# Patient Record
Sex: Female | Born: 1965 | Race: White | Hispanic: No | Smoking: Former smoker
Health system: Southern US, Community
[De-identification: ages and names within clinical notes are randomized; demographics above are authoritative.]

## PROBLEM LIST (undated history)

## (undated) DIAGNOSIS — C801 Malignant (primary) neoplasm, unspecified: Secondary | ICD-10-CM

## (undated) DIAGNOSIS — F329 Major depressive disorder, single episode, unspecified: Secondary | ICD-10-CM

## (undated) DIAGNOSIS — T7840XA Allergy, unspecified, initial encounter: Secondary | ICD-10-CM

## (undated) DIAGNOSIS — D649 Anemia, unspecified: Secondary | ICD-10-CM

## (undated) DIAGNOSIS — G43009 Migraine without aura, not intractable, without status migrainosus: Secondary | ICD-10-CM

## (undated) DIAGNOSIS — N8003 Adenomyosis of the uterus: Secondary | ICD-10-CM

## (undated) DIAGNOSIS — D219 Benign neoplasm of connective and other soft tissue, unspecified: Secondary | ICD-10-CM

## (undated) DIAGNOSIS — S42002A Fracture of unspecified part of left clavicle, initial encounter for closed fracture: Secondary | ICD-10-CM

## (undated) DIAGNOSIS — R87619 Unspecified abnormal cytological findings in specimens from cervix uteri: Secondary | ICD-10-CM

## (undated) DIAGNOSIS — Z789 Other specified health status: Secondary | ICD-10-CM

## (undated) DIAGNOSIS — K219 Gastro-esophageal reflux disease without esophagitis: Secondary | ICD-10-CM

## (undated) DIAGNOSIS — W19XXXA Unspecified fall, initial encounter: Secondary | ICD-10-CM

## (undated) DIAGNOSIS — D0372 Melanoma in situ of left lower limb, including hip: Secondary | ICD-10-CM

## (undated) DIAGNOSIS — F32A Depression, unspecified: Secondary | ICD-10-CM

## (undated) DIAGNOSIS — I10 Essential (primary) hypertension: Secondary | ICD-10-CM

## (undated) DIAGNOSIS — F419 Anxiety disorder, unspecified: Secondary | ICD-10-CM

## (undated) DIAGNOSIS — E785 Hyperlipidemia, unspecified: Secondary | ICD-10-CM

## (undated) DIAGNOSIS — Z975 Presence of (intrauterine) contraceptive device: Secondary | ICD-10-CM

## (undated) DIAGNOSIS — N946 Dysmenorrhea, unspecified: Secondary | ICD-10-CM

## (undated) HISTORY — DX: Benign neoplasm of connective and other soft tissue, unspecified: D21.9

## (undated) HISTORY — DX: Unspecified abnormal cytological findings in specimens from cervix uteri: R87.619

## (undated) HISTORY — PX: DILATION AND CURETTAGE OF UTERUS: SHX78

## (undated) HISTORY — DX: Gastro-esophageal reflux disease without esophagitis: K21.9

## (undated) HISTORY — DX: Unspecified fall, initial encounter: W19.XXXA

## (undated) HISTORY — PX: CARPAL TUNNEL RELEASE: SHX101

## (undated) HISTORY — PX: COLPOSCOPY: SHX161

## (undated) HISTORY — DX: Anemia, unspecified: D64.9

## (undated) HISTORY — DX: Malignant (primary) neoplasm, unspecified: C80.1

## (undated) HISTORY — DX: Depression, unspecified: F32.A

## (undated) HISTORY — DX: Dysmenorrhea, unspecified: N94.6

## (undated) HISTORY — PX: VEIN LIGATION AND STRIPPING: SHX2653

## (undated) HISTORY — DX: Allergy, unspecified, initial encounter: T78.40XA

## (undated) HISTORY — DX: Adenomyosis of the uterus: N80.03

## (undated) HISTORY — DX: Major depressive disorder, single episode, unspecified: F32.9

## (undated) HISTORY — DX: Hyperlipidemia, unspecified: E78.5

## (undated) HISTORY — DX: Presence of (intrauterine) contraceptive device: Z97.5

## (undated) HISTORY — DX: Melanoma in situ of left lower limb, including hip: D03.72

## (undated) HISTORY — DX: Migraine without aura, not intractable, without status migrainosus: G43.009

## (undated) HISTORY — DX: Anxiety disorder, unspecified: F41.9

## (undated) HISTORY — DX: Essential (primary) hypertension: I10

---

## 1993-07-03 HISTORY — PX: BREAST SURGERY: SHX581

## 2003-10-30 ENCOUNTER — Other Ambulatory Visit: Admission: RE | Admit: 2003-10-30 | Discharge: 2003-10-30 | Payer: Self-pay | Admitting: Obstetrics & Gynecology

## 2004-05-09 ENCOUNTER — Ambulatory Visit: Payer: Self-pay | Admitting: Family Medicine

## 2004-08-26 ENCOUNTER — Ambulatory Visit: Payer: Self-pay | Admitting: Family Medicine

## 2004-10-03 ENCOUNTER — Other Ambulatory Visit: Admission: RE | Admit: 2004-10-03 | Discharge: 2004-10-03 | Payer: Self-pay | Admitting: Obstetrics & Gynecology

## 2006-08-08 ENCOUNTER — Ambulatory Visit: Payer: Self-pay | Admitting: Family Medicine

## 2007-06-11 ENCOUNTER — Encounter: Payer: Self-pay | Admitting: Family Medicine

## 2012-06-22 ENCOUNTER — Ambulatory Visit: Payer: Self-pay | Admitting: Family Medicine

## 2012-06-24 ENCOUNTER — Encounter: Payer: Self-pay | Admitting: Family

## 2012-06-24 ENCOUNTER — Ambulatory Visit (INDEPENDENT_AMBULATORY_CARE_PROVIDER_SITE_OTHER): Payer: Self-pay | Admitting: Family

## 2012-06-24 VITALS — BP 108/76 | HR 109 | Temp 98.5°F | Ht <= 58 in | Wt 146.0 lb

## 2012-06-24 DIAGNOSIS — R5381 Other malaise: Secondary | ICD-10-CM

## 2012-06-24 DIAGNOSIS — J069 Acute upper respiratory infection, unspecified: Secondary | ICD-10-CM

## 2012-06-24 DIAGNOSIS — IMO0001 Reserved for inherently not codable concepts without codable children: Secondary | ICD-10-CM

## 2012-06-24 DIAGNOSIS — M791 Myalgia, unspecified site: Secondary | ICD-10-CM

## 2012-06-24 DIAGNOSIS — R5383 Other fatigue: Secondary | ICD-10-CM

## 2012-06-24 MED ORDER — PREDNISONE 20 MG PO TABS: ORAL_TABLET | ORAL | Status: AC

## 2012-06-24 NOTE — Patient Instructions (Signed)

## 2012-06-24 NOTE — Progress Notes (Signed)
  Subjective:    Patient ID: Kim Elliott, female    DOB: 1966/03/04, 46 y.o.   MRN: [id number]  HPI 46 year old white female, nonsmoker, is in with complaints of cough, congestion, muscle aches and pain, and fatigue x2 days. reports vomiting mucus. Also complains of chest discomfort to her right ribs. Has been taken over-the-counter cough medication with no relief.   Review of Systems  Constitutional: Positive for fatigue.  HENT: Positive for congestion, sore throat, sneezing, postnasal drip and sinus pressure.   Respiratory: Positive for cough.   Cardiovascular: Negative.   Gastrointestinal: Negative.   Musculoskeletal: Positive for myalgias. Negative for back pain and gait problem.  Skin: Negative.   Neurological: Negative.   Hematological: Negative.   Psychiatric/Behavioral: Negative.    No past medical history on file.  History   Social History  . Marital Status: Married    Spouse Name: N/A    Number of Children: N/A  . Years of Education: N/A   Occupational History  . Not on file.   Social History Main Topics  . Smoking status: Former Games developer  . Smokeless tobacco: Not on file  . Alcohol Use: Not on file  . Drug Use: Not on file  . Sexually Active: Not on file   Other Topics Concern  . Not on file   Social History Narrative  . No narrative on file    No past surgical history on file.  No family history on file.  Allergies  Allergen Reactions  . Codeine     No current outpatient prescriptions on file prior to visit.    BP 108/76  Pulse 109  Temp 98.5 F (36.9 C) (Oral)  Ht 4\' 7"  (1.397 m)  Wt 146 lb (66.225 kg)  BMI 33.93 kg/m2  SpO2 97%chart    Objective:   Physical Exam  Constitutional: She is oriented to person, place, and time. She appears well-developed and well-nourished.  HENT:  Right Ear: External ear normal.  Left Ear: External ear normal.  Nose: Nose normal.  Mouth/Throat: Oropharynx is clear and moist.  Neck: Normal range of  motion. Neck supple.  Cardiovascular: Normal rate and regular rhythm.   Pulmonary/Chest: Effort normal and breath sounds normal.  Abdominal: Soft. Bowel sounds are normal.  Neurological: She is alert and oriented to person, place, and time.  Skin: Skin is warm and dry.  Psychiatric: She has a normal mood and affect.          Assessment & Plan:   Assessment: Upper respiratory infection, cough, fatigue  Plan: Prednisone 60x3, 40x3, 20x3. Over-the-counter Mucinex DM as directed. Rest. Drink plenty of fluids. Patient call the office if symptoms worsen or persist. Recheck a schedule, and when necessary.

## 2013-05-26 ENCOUNTER — Telehealth: Payer: Self-pay | Admitting: Family Medicine

## 2013-05-26 MED ORDER — CEFUROXIME AXETIL 500 MG PO TABS: 500.0000 mg | ORAL_TABLET | Freq: Two times a day (BID) | ORAL | Status: DC

## 2013-05-26 NOTE — Telephone Encounter (Signed)
She has had one week of fever, sinus pressure, and a dry cough.

## 2014-04-25 ENCOUNTER — Encounter (HOSPITAL_COMMUNITY): Payer: Self-pay | Admitting: Emergency Medicine

## 2014-04-25 ENCOUNTER — Emergency Department (HOSPITAL_COMMUNITY)
Admission: EM | Admit: 2014-04-25 | Discharge: 2014-04-26 | Disposition: A | Discharge status: Home / Self Care | Payer: No Typology Code available for payment source | Attending: Emergency Medicine | Admitting: Emergency Medicine

## 2014-04-25 DIAGNOSIS — W19XXXA Unspecified fall, initial encounter: Secondary | ICD-10-CM

## 2014-04-25 DIAGNOSIS — Z87891 Personal history of nicotine dependence: Secondary | ICD-10-CM | POA: Diagnosis not present

## 2014-04-25 DIAGNOSIS — Y9289 Other specified places as the place of occurrence of the external cause: Secondary | ICD-10-CM | POA: Diagnosis not present

## 2014-04-25 DIAGNOSIS — S42002A Fracture of unspecified part of left clavicle, initial encounter for closed fracture: Secondary | ICD-10-CM

## 2014-04-25 DIAGNOSIS — Y9301 Activity, walking, marching and hiking: Secondary | ICD-10-CM | POA: Diagnosis not present

## 2014-04-25 DIAGNOSIS — W1830XA Fall on same level, unspecified, initial encounter: Secondary | ICD-10-CM | POA: Insufficient documentation

## 2014-04-25 DIAGNOSIS — S42022A Displaced fracture of shaft of left clavicle, initial encounter for closed fracture: Secondary | ICD-10-CM | POA: Diagnosis not present

## 2014-04-25 DIAGNOSIS — S4992XA Unspecified injury of left shoulder and upper arm, initial encounter: Secondary | ICD-10-CM | POA: Diagnosis present

## 2014-04-25 MED ORDER — LORAZEPAM 2 MG/ML IJ SOLN
0.2500 mg | Freq: Once | INTRAMUSCULAR | Status: DC
  Filled 2014-04-25: qty 1

## 2014-04-25 MED ORDER — LORAZEPAM 2 MG/ML IJ SOLN
0.5000 mg | Freq: Once | INTRAMUSCULAR | Status: AC
  Administered 2014-04-25: 0.25 mg via INTRAVENOUS

## 2014-04-25 NOTE — ED Notes (Signed)
Per ems- pt was walking dogs and was pulled down. Pt sts she felt crack to L clavicle. Some pain to back of head as well. No LOC. ems administered a total 218mcg of fentanyl last dose at 2152.

## 2014-04-25 NOTE — ED Provider Notes (Signed)
CSN: [id number]     Arrival date & time 04/25/14  2157 History   First MD Initiated Contact with Patient 04/25/14 2206     Chief Complaint  Patient presents with  . Arm Injury     (Consider location/radiation/quality/duration/timing/severity/associated sxs/prior Treatment) HPI  Patient presents the emergency department by EMS after injury from falling. She was walking her Retail banker when he pulled hard on the leash causing her to fall down with her left side towards the ground. She reports feeling a crack to her left clavicle on impact and immediatly having severe pain. He denies hitting her head or LOC. EMS gave her 250 g of fentanyl however the patient says her pain did not improve with this medication and it still hurts. She denies having pain anywhere else. The patient is tearful.  History reviewed. No pertinent past medical history. History reviewed. No pertinent past surgical history. No family history on file. History  Substance Use Topics  . Smoking status: Former Research scientist (life sciences)  . Smokeless tobacco: Not on file  . Alcohol Use: Yes   OB History   Grav Para Term Preterm Abortions TAB SAB Ect Mult Living                 Review of Systems  10 Systems reviewed and are negative for acute change except as noted in the HPI.   Allergies  Codeine  Home Medications   Prior to Admission medications   Medication Sig Start Date End Date Taking? Authorizing Provider  loratadine (CLARITIN) 10 MG tablet Take 10 mg by mouth daily as needed for allergies.   Yes Historical Provider, MD  ibuprofen (ADVIL,MOTRIN) 600 MG tablet Take 1 tablet (600 mg total) by mouth every 6 (six) hours as needed. 04/26/14   Linus Mako, PA-C  traMADol (ULTRAM) 50 MG tablet Take 1 tablet (50 mg total) by mouth every 6 (six) hours as needed. 04/26/14   Shirely Toren Marilu Favre, PA-C   BP 119/80  Pulse 89  Temp(Src) 98.2 F (36.8 C)  Resp 16  Ht 5\' 5"  (1.651 m)  Wt 120 lb (54.432 kg)  BMI 19.97 kg/m2   SpO2 97%  LMP 04/04/2014 Physical Exam  Nursing note and vitals reviewed. Constitutional: She appears well-developed and well-nourished. No distress.  HENT:  Head: Normocephalic and atraumatic. Head is without contusion and without laceration.  Eyes: Pupils are equal, round, and reactive to light.  Neck: Normal range of motion. Neck supple. No spinous process tenderness and no muscular tenderness present. Normal range of motion present.  Cardiovascular: Normal rate and regular rhythm.   Pulmonary/Chest: Effort normal.  Abdominal: Soft.  Musculoskeletal:       Left shoulder: She exhibits decreased range of motion, tenderness, bony tenderness and pain. She exhibits no swelling, no effusion, no crepitus, no deformity, no laceration, no spasm, normal pulse and normal strength.  Pt having severe tenderness to left clavicle. Decreased ROM due to pain. She has no skin tenting or obvious deformities. Grip strength is physiologic. No pain to palpation of neck, humerus, olecranon, radial/ulna, wrist or hand.  Neurological: She is alert.  Skin: Skin is warm and dry.    ED Course  Procedures (including critical care time) Labs Review Labs Reviewed - No data to display  Imaging Review Dg Clavicle Left  04/26/2014   CLINICAL DATA:  Fall, pulled down by dog. Left shoulder/clavicle pain. Limited range of motion within the left arm. Initial encounter.  EXAM: LEFT CLAVICLE - 2+ VIEWS  COMPARISON:  Left shoulder series performed today.  FINDINGS: There it is a fracture through the midshaft of the left clavicle. Mild apex superior angulation. Mildly displaced fracture fragments, best seen on the shoulder series. AC joint and glenohumeral joint are intact.  IMPRESSION: Mildly angulated and displaced mid left clavicle fracture.   Electronically Signed   By: Rolm Baptise M.D.   On: 04/26/2014 00:18   Dg Shoulder Left  04/26/2014   CLINICAL DATA:  Fall, pole down by dog. Left clavicle/shoulder pain. Initial  encounter.  EXAM: LEFT SHOULDER - 2+ VIEW  COMPARISON:  None.  FINDINGS: There is a mid left clavicle fracture with mild displacement and apex superior angulation. AC joint and glenohumeral joint are intact. No additional acute bony abnormality.  IMPRESSION: Mildly angulated and displaced mid left clavicle fracture.   Electronically Signed   By: Rolm Baptise M.D.   On: 04/26/2014 00:20     EKG Interpretation None      MDM   Final diagnoses:  Fall  Closed left clavicular fracture, initial encounter    Medications  LORazepam (ATIVAN) injection 0.5 mg (0.25 mg Intravenous Given 04/25/14 2247)  ketorolac (TORADOL) 30 MG/ML injection 30 mg (30 mg Intravenous Given 04/26/14 0040)  ondansetron (ZOFRAN) injection 4 mg (4 mg Intravenous Given 04/26/14 0038)    Pt says that she can not tolerate Vicodin or Percocet as it makes her hallucinate. She says that none of the medication we have given her in the ED have helped. She has no tenting or tenderness proximal or distal to the clavicle. Normal neurovascular exam. Dr. Alvan Dame is on call but her orthopedist is at Dr. Percell Miller and [facility] office, she prefers to be referred to this location.  Given Ice pack and shoulder immobilizer. Discussed treatment plan. She voiced her understanding.  48 y.o.Jaedah Betzer's evaluation in the Emergency Department is complete. It has been determined that no acute conditions requiring further emergency intervention are present at this time. The patient/guardian have been advised of the diagnosis and plan. We have discussed signs and symptoms that warrant return to the ED, such as changes or worsening in symptoms.  Vital signs are stable at discharge. Filed Vitals:   04/26/14 0050  BP: 119/80  Pulse: 89  Temp:   Resp: 16    Patient/guardian has voiced understanding and agreed to follow-up with the PCP or specialist.     Linus Mako, PA-C 04/26/14 0128

## 2014-04-26 ENCOUNTER — Emergency Department (HOSPITAL_COMMUNITY): Payer: No Typology Code available for payment source

## 2014-04-26 MED ORDER — IBUPROFEN 800 MG PO TABS: 800.0000 mg | ORAL_TABLET | Freq: Once | ORAL | Status: DC

## 2014-04-26 MED ORDER — IBUPROFEN 600 MG PO TABS: 600.0000 mg | ORAL_TABLET | Freq: Four times a day (QID) | ORAL | Status: DC | PRN

## 2014-04-26 MED ORDER — ONDANSETRON 4 MG PO TBDP: 4.0000 mg | ORAL_TABLET | Freq: Once | ORAL | Status: DC

## 2014-04-26 MED ORDER — ONDANSETRON HCL 4 MG/2ML IJ SOLN
4.0000 mg | Freq: Once | INTRAMUSCULAR | Status: AC
  Administered 2014-04-26: 4 mg via INTRAVENOUS
  Filled 2014-04-26: qty 2

## 2014-04-26 MED ORDER — OXYCODONE-ACETAMINOPHEN 5-325 MG PO TABS: 1.0000 | ORAL_TABLET | Freq: Once | ORAL | Status: DC

## 2014-04-26 MED ORDER — TRAMADOL HCL 50 MG PO TABS: 50.0000 mg | ORAL_TABLET | Freq: Four times a day (QID) | ORAL | Status: DC | PRN

## 2014-04-26 MED ORDER — TRAMADOL HCL 50 MG PO TABS: 50.0000 mg | ORAL_TABLET | Freq: Once | ORAL | Status: DC

## 2014-04-26 MED ORDER — KETOROLAC TROMETHAMINE 30 MG/ML IJ SOLN
30.0000 mg | Freq: Once | INTRAMUSCULAR | Status: AC
  Administered 2014-04-26: 30 mg via INTRAVENOUS
  Filled 2014-04-26: qty 1

## 2014-04-26 NOTE — ED Notes (Signed)
Ortho tech paged to place sling on pt.

## 2014-04-26 NOTE — Discharge Instructions (Signed)
Clavicle Fracture °The clavicle, also called the collarbone, is the long bone that connects your shoulder to your rib cage. You can feel your collarbone at the top of your shoulders and rib cage. A clavicle fracture is a broken clavicle. It is a common injury that can happen at any age.  °CAUSES °Common causes of a clavicle fracture include: °· A direct blow to your shoulder. °· A car accident. °· A fall, especially if you try to break your fall with an outstretched arm. °RISK FACTORS °You may be at increased risk if: °· You are younger than 25 years or older than 75 years. Most clavicle fractures happen to people who are younger than 25 years. °· You are a female. °· You play contact sports. °SIGNS AND SYMPTOMS °A fractured clavicle is painful. It also makes it hard to move your arm. Other signs and symptoms may include: °· A shoulder that drops downward and forward. °· Pain when trying to lift your shoulder. °· Bruising, swelling, and tenderness over your clavicle. °· A grinding noise when you try to move your shoulder. °· A bump over your clavicle. °DIAGNOSIS °Your health care provider can usually diagnose a clavicle fracture by asking about your injury and examining your shoulder and clavicle. He or she may take an X-ray to determine the position of your clavicle. °TREATMENT °Treatment depends on the position of your clavicle after the fracture: °· If the broken ends of the bone are not out of place, your health care provider may put your arm in a sling or wrap a support bandage around your chest (figure-of-eight wrap). °· If the broken ends of the bone are out of place, you may need surgery. Surgery may involve placing screws, pins, or plates to keep your clavicle stable while it heals. Healing may take about 3 months. °When your health care provider thinks your fracture has healed enough, you may have to do physical therapy to regain normal movement and build up your arm strength. °HOME CARE INSTRUCTIONS   °· Apply ice to the injured area: °¨ Put ice in a plastic bag. °¨ Place a towel between your skin and the bag. °¨ Leave the ice on for 20 minutes, 2-3 times a day. °· If you have a wrap or splint: °¨ Wear it all the time, and remove it only to take a bath or shower. °¨ When you bathe or shower, keep your shoulder in the same position as when the sling or wrap is on. °¨ Do not lift your arm. °· If you have a figure-of-eight wrap: °¨ Another person must tighten it every day. °¨ It should be tight enough to hold your shoulders back. °¨ Allow enough room to place your index finger between your body and the strap. °¨ Loosen the wrap immediately if you feel numbness or tingling in your hands. °· Only take medicines as directed by your health care provider. °· Avoid activities that make the injury or pain worse for 4-6 weeks after surgery. °· Keep all follow-up appointments. °SEEK MEDICAL CARE IF:  °Your medicine is not helping to relieve pain and swelling. °SEEK IMMEDIATE MEDICAL CARE IF:  °Your arm is numb, cold, or pale, even when the splint is loose. °MAKE SURE YOU:  °· Understand these instructions. °· Will watch your condition. °· Will get help right away if you are not doing well or get worse. °Document Released: 03/29/2005 Document Revised: 06/24/2013 Document Reviewed: 05/12/2013 °[employer]® Patient Information ©2015 [employer]. This information is   not intended to replace advice given to you by your health care provider. Make sure you discuss any questions you have with your health care provider.

## 2014-04-26 NOTE — ED Provider Notes (Signed)
Medical screening examination/treatment/procedure(s) were performed by non-physician practitioner and as supervising physician I was immediately available for consultation/collaboration.   EKG Interpretation None        Pamella Pert, MD 04/26/14 1106

## 2014-04-30 ENCOUNTER — Encounter (HOSPITAL_BASED_OUTPATIENT_CLINIC_OR_DEPARTMENT_OTHER): Payer: Self-pay | Admitting: *Deleted

## 2014-04-30 NOTE — Progress Notes (Signed)
No labs needed

## 2014-05-04 ENCOUNTER — Other Ambulatory Visit: Payer: Self-pay | Admitting: Orthopedic Surgery

## 2014-05-05 ENCOUNTER — Ambulatory Visit (HOSPITAL_BASED_OUTPATIENT_CLINIC_OR_DEPARTMENT_OTHER): Payer: No Typology Code available for payment source | Admitting: Anesthesiology

## 2014-05-05 ENCOUNTER — Encounter (HOSPITAL_BASED_OUTPATIENT_CLINIC_OR_DEPARTMENT_OTHER)
Admission: RE | Disposition: A | Discharge status: Home / Self Care | Payer: Self-pay | Source: Ambulatory Visit | Attending: Orthopedic Surgery

## 2014-05-05 ENCOUNTER — Ambulatory Visit (HOSPITAL_BASED_OUTPATIENT_CLINIC_OR_DEPARTMENT_OTHER)
Admission: RE | Admit: 2014-05-05 | Discharge: 2014-05-05 | Disposition: A | Discharge status: Home / Self Care | Payer: No Typology Code available for payment source | Source: Ambulatory Visit | Attending: Orthopedic Surgery | Admitting: Orthopedic Surgery

## 2014-05-05 ENCOUNTER — Encounter (HOSPITAL_BASED_OUTPATIENT_CLINIC_OR_DEPARTMENT_OTHER): Payer: Self-pay

## 2014-05-05 DIAGNOSIS — S42002A Fracture of unspecified part of left clavicle, initial encounter for closed fracture: Secondary | ICD-10-CM | POA: Insufficient documentation

## 2014-05-05 DIAGNOSIS — X58XXXA Exposure to other specified factors, initial encounter: Secondary | ICD-10-CM | POA: Diagnosis not present

## 2014-05-05 DIAGNOSIS — Z885 Allergy status to narcotic agent status: Secondary | ICD-10-CM | POA: Insufficient documentation

## 2014-05-05 DIAGNOSIS — Y929 Unspecified place or not applicable: Secondary | ICD-10-CM | POA: Diagnosis not present

## 2014-05-05 DIAGNOSIS — Z87891 Personal history of nicotine dependence: Secondary | ICD-10-CM | POA: Diagnosis not present

## 2014-05-05 HISTORY — PX: ORIF CLAVICULAR FRACTURE: SHX5055

## 2014-05-05 HISTORY — DX: Fracture of unspecified part of left clavicle, initial encounter for closed fracture: S42.002A

## 2014-05-05 HISTORY — DX: Other specified health status: Z78.9

## 2014-05-05 SURGERY — OPEN REDUCTION INTERNAL FIXATION (ORIF) CLAVICULAR FRACTURE
Anesthesia: General | Site: Shoulder | Laterality: Left

## 2014-05-05 MED ORDER — PROPOFOL 10 MG/ML IV BOLUS
INTRAVENOUS | Status: DC | PRN
  Administered 2014-05-05: 200 mg via INTRAVENOUS

## 2014-05-05 MED ORDER — ONDANSETRON HCL 4 MG/2ML IJ SOLN
INTRAMUSCULAR | Status: DC | PRN
  Administered 2014-05-05: 4 mg via INTRAVENOUS

## 2014-05-05 MED ORDER — ONDANSETRON HCL 4 MG PO TABS: 4.0000 mg | ORAL_TABLET | Freq: Three times a day (TID) | ORAL | Status: DC | PRN

## 2014-05-05 MED ORDER — DEXAMETHASONE SODIUM PHOSPHATE 4 MG/ML IJ SOLN
INTRAMUSCULAR | Status: DC | PRN
  Administered 2014-05-05: 10 mg via INTRAVENOUS

## 2014-05-05 MED ORDER — HYDROMORPHONE HCL 1 MG/ML IJ SOLN
INTRAMUSCULAR | Status: AC
  Filled 2014-05-05: qty 1

## 2014-05-05 MED ORDER — 0.9 % SODIUM CHLORIDE (POUR BTL) OPTIME
TOPICAL | Status: DC | PRN
  Administered 2014-05-05: 500 mL

## 2014-05-05 MED ORDER — MIDAZOLAM HCL 2 MG/2ML IJ SOLN
1.0000 mg | INTRAMUSCULAR | Status: DC | PRN
  Administered 2014-05-05: 2 mg via INTRAVENOUS

## 2014-05-05 MED ORDER — MIDAZOLAM HCL 2 MG/2ML IJ SOLN
INTRAMUSCULAR | Status: AC
  Filled 2014-05-05: qty 2

## 2014-05-05 MED ORDER — SUFENTANIL CITRATE 50 MCG/ML IV SOLN
INTRAVENOUS | Status: AC
  Filled 2014-05-05: qty 1

## 2014-05-05 MED ORDER — MIDAZOLAM HCL 5 MG/5ML IJ SOLN
INTRAMUSCULAR | Status: DC | PRN
  Administered 2014-05-05: 2 mg via INTRAVENOUS

## 2014-05-05 MED ORDER — PROPOFOL 10 MG/ML IV BOLUS
INTRAVENOUS | Status: AC
  Filled 2014-05-05: qty 60

## 2014-05-05 MED ORDER — BACLOFEN 10 MG PO TABS: 10.0000 mg | ORAL_TABLET | Freq: Three times a day (TID) | ORAL | Status: DC

## 2014-05-05 MED ORDER — LIDOCAINE HCL (CARDIAC) 20 MG/ML IV SOLN
INTRAVENOUS | Status: DC | PRN
  Administered 2014-05-05: 50 mg via INTRAVENOUS

## 2014-05-05 MED ORDER — SUCCINYLCHOLINE CHLORIDE 20 MG/ML IJ SOLN
INTRAMUSCULAR | Status: DC | PRN
  Administered 2014-05-05: 100 mg via INTRAVENOUS

## 2014-05-05 MED ORDER — EPHEDRINE SULFATE 50 MG/ML IJ SOLN
INTRAMUSCULAR | Status: DC | PRN
  Administered 2014-05-05: 15 mg via INTRAVENOUS

## 2014-05-05 MED ORDER — CEFAZOLIN SODIUM 1-5 GM-% IV SOLN
INTRAVENOUS | Status: AC
  Filled 2014-05-05: qty 100

## 2014-05-05 MED ORDER — SUCCINYLCHOLINE CHLORIDE 20 MG/ML IJ SOLN
INTRAMUSCULAR | Status: AC
  Filled 2014-05-05: qty 1

## 2014-05-05 MED ORDER — ONDANSETRON HCL 4 MG/2ML IJ SOLN: 4.0000 mg | Freq: Once | INTRAMUSCULAR | Status: DC | PRN

## 2014-05-05 MED ORDER — SENNA-DOCUSATE SODIUM 8.6-50 MG PO TABS: 2.0000 | ORAL_TABLET | Freq: Every day | ORAL | Status: DC

## 2014-05-05 MED ORDER — OXYCODONE-ACETAMINOPHEN 5-325 MG PO TABS: 1.0000 | ORAL_TABLET | Freq: Four times a day (QID) | ORAL | Status: DC | PRN

## 2014-05-05 MED ORDER — FENTANYL CITRATE 0.05 MG/ML IJ SOLN
INTRAMUSCULAR | Status: AC
  Filled 2014-05-05: qty 2

## 2014-05-05 MED ORDER — HYDROMORPHONE HCL 1 MG/ML IJ SOLN
0.5000 mg | INTRAMUSCULAR | Status: DC | PRN
  Administered 2014-05-05 (×3): 0.5 mg via INTRAVENOUS

## 2014-05-05 MED ORDER — FENTANYL CITRATE 0.05 MG/ML IJ SOLN
50.0000 ug | INTRAMUSCULAR | Status: DC | PRN
  Administered 2014-05-05: 100 ug via INTRAVENOUS

## 2014-05-05 MED ORDER — LACTATED RINGERS IV SOLN
INTRAVENOUS | Status: DC
  Administered 2014-05-05 (×2): via INTRAVENOUS
  Administered 2014-05-05: 10 mL/h via INTRAVENOUS

## 2014-05-05 MED ORDER — BUPIVACAINE-EPINEPHRINE (PF) 0.5% -1:200000 IJ SOLN
INTRAMUSCULAR | Status: DC | PRN
  Administered 2014-05-05: 25 mL via PERINEURAL

## 2014-05-05 MED ORDER — FENTANYL CITRATE 0.05 MG/ML IJ SOLN: 25.0000 ug | INTRAMUSCULAR | Status: DC | PRN

## 2014-05-05 MED ORDER — SUFENTANIL CITRATE 50 MCG/ML IV SOLN
INTRAVENOUS | Status: DC | PRN
  Administered 2014-05-05: 10 ug via INTRAVENOUS

## 2014-05-05 MED ORDER — CEFAZOLIN SODIUM-DEXTROSE 2-3 GM-% IV SOLR
2.0000 g | INTRAVENOUS | Status: AC
  Administered 2014-05-05: 2 g via INTRAVENOUS

## 2014-05-05 SURGICAL SUPPLY — 65 items
BENZOIN TINCTURE PRP APPL 2/3 (GAUZE/BANDAGES/DRESSINGS) IMPLANT
BIT DRILL 2.8X5 QR DISP (BIT) ×2 IMPLANT
BLADE 11 SAFETY STRL DISP (BLADE) IMPLANT
BLADE SURG 15 STRL LF DISP TIS (BLADE) ×2 IMPLANT
BLADE SURG 15 STRL SS (BLADE) ×4
BLADE VORTEX 6.0 (BLADE) IMPLANT
CANISTER SUCT 3000ML (MISCELLANEOUS) IMPLANT
CLEANER CAUTERY TIP 5X5 PAD (MISCELLANEOUS) IMPLANT
CLSR STERI-STRIP ANTIMIC 1/2X4 (GAUZE/BANDAGES/DRESSINGS) IMPLANT
DECANTER SPIKE VIAL GLASS SM (MISCELLANEOUS) IMPLANT
DRAPE C-ARM 42X72 X-RAY (DRAPES) IMPLANT
DRAPE INCISE IOBAN 66X45 STRL (DRAPES) ×2 IMPLANT
DRAPE OEC MINIVIEW 54X84 (DRAPES) ×2 IMPLANT
DRAPE SURG 17X23 STRL (DRAPES) ×2 IMPLANT
DRAPE U 20/CS (DRAPES) ×2 IMPLANT
DRAPE U-SHAPE 47X51 STRL (DRAPES) ×2 IMPLANT
DRAPE U-SHAPE 76X120 STRL (DRAPES) ×4 IMPLANT
DURAPREP 26ML APPLICATOR (WOUND CARE) ×2 IMPLANT
ELECT REM PT RETURN 9FT ADLT (ELECTROSURGICAL) ×2
ELECTRODE REM PT RTRN 9FT ADLT (ELECTROSURGICAL) ×1 IMPLANT
GAUZE SPONGE 4X4 12PLY STRL (GAUZE/BANDAGES/DRESSINGS) ×2 IMPLANT
GAUZE SPONGE 4X4 16PLY XRAY LF (GAUZE/BANDAGES/DRESSINGS) IMPLANT
GAUZE XEROFORM 1X8 LF (GAUZE/BANDAGES/DRESSINGS) IMPLANT
GLOVE BIO SURGEON STRL SZ 6.5 (GLOVE) ×2 IMPLANT
GLOVE BIO SURGEON STRL SZ8 (GLOVE) ×2 IMPLANT
GLOVE BIOGEL PI IND STRL 7.0 (GLOVE) ×1 IMPLANT
GLOVE BIOGEL PI IND STRL 8 (GLOVE) ×2 IMPLANT
GLOVE BIOGEL PI INDICATOR 7.0 (GLOVE) ×1
GLOVE BIOGEL PI INDICATOR 8 (GLOVE) ×2
GLOVE EXAM NITRILE MD LF STRL (GLOVE) ×2 IMPLANT
GLOVE ORTHO TXT STRL SZ7.5 (GLOVE) ×4 IMPLANT
GOWN STRL REUS W/ TWL LRG LVL3 (GOWN DISPOSABLE) ×1 IMPLANT
GOWN STRL REUS W/ TWL XL LVL3 (GOWN DISPOSABLE) ×2 IMPLANT
GOWN STRL REUS W/TWL LRG LVL3 (GOWN DISPOSABLE) ×1
GOWN STRL REUS W/TWL XL LVL3 (GOWN DISPOSABLE) ×2
NS IRRIG 1000ML POUR BTL (IV SOLUTION) ×2 IMPLANT
PACK ARTHROSCOPY DSU (CUSTOM PROCEDURE TRAY) ×2 IMPLANT
PACK BASIN DAY SURGERY FS (CUSTOM PROCEDURE TRAY) ×2 IMPLANT
PAD CLEANER CAUTERY TIP 5X5 (MISCELLANEOUS)
PENCIL BUTTON HOLSTER BLD 10FT (ELECTRODE) ×2 IMPLANT
PLATE CLAV LOCK 6H SML (Plate) ×2 IMPLANT
SCREW NON LOCK 3.5X10MM (Screw) ×4 IMPLANT
SCREW NON LOCK 3.5X8MM (Screw) ×10 IMPLANT
SLEEVE SCD COMPRESS KNEE MED (MISCELLANEOUS) ×2 IMPLANT
SLING ARM IMMOBILIZER LRG (SOFTGOODS) IMPLANT
SLING ARM IMMOBILIZER MED (SOFTGOODS) IMPLANT
SLING ARM LRG ADULT FOAM STRAP (SOFTGOODS) IMPLANT
SLING ARM MED ADULT FOAM STRAP (SOFTGOODS) IMPLANT
SLING ARM XL FOAM STRAP (SOFTGOODS) IMPLANT
SPONGE LAP 4X18 X RAY DECT (DISPOSABLE) ×2 IMPLANT
SUCTION FRAZIER TIP 10 FR DISP (SUCTIONS) ×2 IMPLANT
SUT FIBERWIRE #2 38 T-5 BLUE (SUTURE)
SUT MNCRL AB 4-0 PS2 18 (SUTURE) IMPLANT
SUT VIC AB 0 CT1 18XCR BRD 8 (SUTURE) ×1 IMPLANT
SUT VIC AB 0 CT1 27 (SUTURE)
SUT VIC AB 0 CT1 27XBRD ANBCTR (SUTURE) IMPLANT
SUT VIC AB 0 CT1 8-18 (SUTURE) ×1
SUT VIC AB 2-0 SH 27 (SUTURE)
SUT VIC AB 2-0 SH 27XBRD (SUTURE) IMPLANT
SUT VICRYL 3-0 CR8 SH (SUTURE) ×2 IMPLANT
SUTURE FIBERWR #2 38 T-5 BLUE (SUTURE) IMPLANT
SYR BULB 3OZ (MISCELLANEOUS) ×2 IMPLANT
TAPE STRIPS DRAPE STRL (GAUZE/BANDAGES/DRESSINGS) IMPLANT
TOWEL OR 17X24 6PK STRL BLUE (TOWEL DISPOSABLE) ×2 IMPLANT
YANKAUER SUCT BULB TIP NO VENT (SUCTIONS) IMPLANT

## 2014-05-05 NOTE — Transfer of Care (Signed)
Immediate Anesthesia Transfer of Care Note  Patient: Kim Elliott  Procedure(s) Performed: Procedure(s): OPEN REDUCTION INTERNAL FIXATION (ORIF) LEFT  CLAVICULAR FRACTURE (Left)  Patient Location: PACU  Anesthesia Type:General and Regional  Level of Consciousness: awake, alert  and oriented  Airway & Oxygen Therapy: Patient Spontanous Breathing and Patient connected to face mask oxygen  Post-op Assessment: Report given to PACU RN and Post -op Vital signs reviewed and stable  Post vital signs: Reviewed and stable  Complications: No apparent anesthesia complications

## 2014-05-05 NOTE — Anesthesia Procedure Notes (Addendum)
Anesthesia Regional Block:  Interscalene brachial plexus block  Pre-Anesthetic Checklist: ,, timeout performed, Correct Patient, Correct Site, Correct Laterality, Correct Procedure, Correct Position, site marked, Risks and benefits discussed,  Surgical consent,  Pre-op evaluation,  At surgeon's request and post-op pain management  Laterality: Left and Upper  Prep: chloraprep       Needles:  Injection technique: Single-shot  Needle Type: Echogenic Needle     Needle Length: 5cm 5 cm Needle Gauge: 21 and 21 G    Additional Needles:  Procedures: ultrasound guided (picture in chart) Interscalene brachial plexus block Narrative:  Start time: 05/05/2014 12:12 PM End time: 05/05/2014 12:17 PM Injection made incrementally with aspirations every 5 mL.  Performed by: Personally    Procedure Name: Intubation Date/Time: 05/05/2014 12:54 PM Performed by: Melynda Ripple D Pre-anesthesia Checklist: Patient identified, Emergency Drugs available, Suction available and Patient being monitored Patient Re-evaluated:Patient Re-evaluated prior to inductionOxygen Delivery Method: Circle System Utilized Preoxygenation: Pre-oxygenation with 100% oxygen Intubation Type: IV induction Ventilation: Mask ventilation without difficulty Laryngoscope Size: Mac and 3 Grade View: Grade I Tube type: Oral Tube size: 7.0 mm Number of attempts: 1 Airway Equipment and Method: stylet and oral airway Placement Confirmation: ETT inserted through vocal cords under direct vision,  positive ETCO2 and breath sounds checked- equal and bilateral Secured at: 23 cm Tube secured with: Tape Dental Injury: Teeth and Oropharynx as per pre-operative assessment

## 2014-05-05 NOTE — Anesthesia Postprocedure Evaluation (Signed)
  Anesthesia Post-op Note  Patient: Kim Elliott  Procedure(s) Performed: Procedure(s): OPEN REDUCTION INTERNAL FIXATION (ORIF) LEFT  CLAVICULAR FRACTURE (Left)  Patient Location: PACU  Anesthesia Type:GA combined with regional for post-op pain  Level of Consciousness: awake, alert , oriented and patient cooperative  Airway and Oxygen Therapy: Patient Spontanous Breathing  Post-op Pain: none  Post-op Assessment: Post-op Vital signs reviewed, Patient's Cardiovascular Status Stable, Respiratory Function Stable, Patent Airway, No signs of Nausea or vomiting, Adequate PO intake and Pain level controlled  Post-op Vital Signs: Reviewed and stable  Last Vitals:  Filed Vitals:   05/05/14 1600  BP: 141/99  Pulse: 98  Temp:   Resp: 17    Complications: No apparent anesthesia complications

## 2014-05-05 NOTE — Anesthesia Preprocedure Evaluation (Signed)
Anesthesia Evaluation  Patient identified by MRN, date of birth, ID band Patient awake    Reviewed: Allergy & Precautions, H&P , NPO status , Patient's Chart, lab work & pertinent test results  Airway Mallampati: I  TM Distance: >3 FB Neck ROM: Full    Dental  (+) Teeth Intact, Dental Advisory Given   Pulmonary former smoker,  breath sounds clear to auscultation        Cardiovascular Rhythm:Regular Rate:Normal     Neuro/Psych    GI/Hepatic   Endo/Other    Renal/GU      Musculoskeletal   Abdominal   Peds  Hematology   Anesthesia Other Findings   Reproductive/Obstetrics                             Anesthesia Physical Anesthesia Plan  ASA: I  Anesthesia Plan: General   Post-op Pain Management:    Induction: Intravenous  Airway Management Planned: Oral ETT  Additional Equipment:   Intra-op Plan:   Post-operative Plan: Extubation in OR  Informed Consent: I have reviewed the patients History and Physical, chart, labs and discussed the procedure including the risks, benefits and alternatives for the proposed anesthesia with the patient or authorized representative who has indicated his/her understanding and acceptance.   Dental advisory given  Plan Discussed with: CRNA, Anesthesiologist and Surgeon  Anesthesia Plan Comments:         Anesthesia Quick Evaluation

## 2014-05-05 NOTE — Op Note (Signed)
05/05/2014  2:13 PM  PATIENT:  Kim Elliott    PRE-OPERATIVE DIAGNOSIS:  LEFT CLAVICLE  FRACTURE  POST-OPERATIVE DIAGNOSIS:  Same  PROCEDURE:  OPEN REDUCTION INTERNAL FIXATION (ORIF) LEFT  CLAVICULAR FRACTURE  SURGEON:  Johnny Bridge, MD  PHYSICIAN ASSISTANT: Joya Gaskins, OPA-C, present and scrubbed throughout the case, critical for completion in a timely fashion, and for retraction, instrumentation, and closure.  ANESTHESIA:   General  PREOPERATIVE INDICATIONS:  Kim Elliott is a  48 y.o. female with a diagnosis of LEFT CLAVICLE  FRACTURE who elected for surgical management based on preoperative shortening and angulation and displacement of the fracture.    The risks benefits and alternatives were discussed with the patient preoperatively including but not limited to the risks of infection, bleeding, nerve injury, malunion, nonunion, hardware failure, the need for hardware removal, recurrent fracture, cardiopulmonary complications, the need for revision surgery, among others, and the patient was willing to proceed.    OPERATIVE IMPLANTS: Acumed 6 hole clavicle plate, with 8 and 10 mm screws  OPERATIVE FINDINGS: Shortened, displaced clavicle fracture, very small clavicle  OPERATIVE PROCEDURE: The patient was brought to the operating room and placed in the supine position. General anesthesia was administered. IV antibiotics were given. She was placed in the beach chair position. The upper extremity was prepped and draped in the usual sterile fashion. Time out was performed. Incision was made over the clavicle fracture. Dissection was carried down through the platysma, and the fracture site exposed. The fracture was extremely short.  I ultimately did however achieve satisfactory mobilization, and was able to reduce the fracture anatomically.   The fracture was essentially transverse and not amenable to a lag screw. I secured the plate medially first, and then laterally, achieving near  anatomic fixation. The remaining screws were placed both medially and laterally for a total of 6 cortices on either side. Screws appeared radiographically small, however my measurements were size 8 for all of the screws, and they had excellent purchase, and so I suspect that they may have simply been exiting slightly posteriorly, as the plate was sought slightly anterosuperior.  I did not make the screws longer in order to avoid prominence, and because at artery had excellent purchase.  I had excellent bony apposition and restoration of anatomic alignment of the clavicle. Used C-arm to confirm appropriate alignment, reduction of the fracture, and positioning of the plate and length of the screws.  I then took final C-arm pictures, irrigated the wounds copiously, and repaired the fascia with inverted figure-of-eight Vicryl suture. The subcutaneous tissue was closed with Vicryl as well, and the skin closed with steri-strips, and the patient was awakened and returned to the PACU in stable and satisfactory condition. There were no complications.

## 2014-05-05 NOTE — H&P (Signed)
PREOPERATIVE H&P  Chief Complaint: LEFT CLAVICLE  FRACTURE  HPI: Kim Elliott is a 48 y.o. female who presents for preoperative history and physical with a diagnosis of LEFT CLAVICLE  FRACTURE. Symptoms are rated as moderate to severe, and have been worsening.  This is significantly impairing activities of daily living.  She has elected for surgical management. Pain is rated as moderate to severe with movement, better with rest and pain medications.  Past Medical History  Diagnosis Date  . Medical history non-contributory    Past Surgical History  Procedure Laterality Date  . Dilation and curettage of uterus      x2post misscarage  . Breast surgery  1995    lt br bx-neg  . Vein ligation and stripping      legs   History   Social History  . Marital Status: Divorced    Spouse Name: N/A    Number of Children: N/A  . Years of Education: N/A   Social History Main Topics  . Smoking status: Former Smoker    Quit date: 04/30/1984  . Smokeless tobacco: None  . Alcohol Use: Yes     Comment: most days-wine  . Drug Use: No  . Sexual Activity: None   Other Topics Concern  . None   Social History Narrative   History reviewed. No pertinent family history. Allergies  Allergen Reactions  . Codeine Nausea And Vomiting  . Oxycodone     hallucinations  . Vicodin [Hydrocodone-Acetaminophen]     hallucinations   Prior to Admission medications   Medication Sig Start Date End Date Taking? Authorizing Provider  ibuprofen (ADVIL,MOTRIN) 600 MG tablet Take 1 tablet (600 mg total) by mouth every 6 (six) hours as needed. 04/26/14  Yes Tiffany Marilu Favre, PA-C  loratadine (CLARITIN) 10 MG tablet Take 10 mg by mouth daily as needed for allergies.   Yes Historical Provider, MD  traMADol (ULTRAM) 50 MG tablet Take 1 tablet (50 mg total) by mouth every 6 (six) hours as needed. 04/26/14  Yes Tiffany Marilu Favre, PA-C     Positive ROS: All other systems have been reviewed and were otherwise  negative with the exception of those mentioned in the HPI and as above.  Physical Exam: General: Alert, no acute distress Cardiovascular: No pedal edema Respiratory: No cyanosis, no use of accessory musculature GI: No organomegaly, abdomen is soft and non-tender Skin: No lesions in the area of chief complaint Neurologic: Sensation intact distally Psychiatric: Patient is competent for consent with normal mood and affect Lymphatic: No axillary or cervical lymphadenopathy  MUSCULOSKELETAL: left clavicle is shortened, positive pain to palpation over the mid shaft, no skin breaks. Sensation and motor is intact distally in the hand.  Assessment: LEFT CLAVICLE  FRACTURE  Plan: Plan for Procedure(s): OPEN REDUCTION INTERNAL FIXATION (ORIF) LEFT  CLAVICULAR FRACTURE  The risks benefits and alternatives were discussed with the patient including but not limited to the risks of nonoperative treatment, versus surgical intervention including infection, bleeding, nerve injury, malunion, nonunion, the need for revision surgery, hardware prominence, hardware failure, the need for hardware removal, blood clots, cardiopulmonary complications, morbidity, mortality, among others, and they were willing to proceed.     Johnny Bridge, MD Cell [phone]   05/05/2014 12:19 PM

## 2014-05-05 NOTE — Discharge Instructions (Signed)
Diet: As you were doing prior to hospitalization   Shower:  May shower but keep the wounds dry, use an occlusive plastic wrap, NO SOAKING IN TUB.  If the bandage gets wet, change with a clean dry gauze.  Dressing:  You may change your dressing 3-5 days after surgery.  Then change the dressing daily with sterile gauze dressing.    There are sticky tapes (steri-strips) on your wounds and all the stitches are absorbable.  Leave the steri-strips in place when changing your dressings, they will peel off with time, usually 2-3 weeks.  Activity:  Increase activity slowly as tolerated, but follow the weight bearing instructions below.  No lifting or driving for 6 weeks.  Weight Bearing:   No lifting with left arm.    To prevent constipation: you may use a stool softener such as -  Colace (over the counter) 100 mg by mouth twice a day  Drink plenty of fluids (prune juice may be helpful) and high fiber foods Miralax (over the counter) for constipation as needed.    Itching:  If you experience itching with your medications, try taking only a single pain pill, or even half a pain pill at a time.  You may take up to 10 pain pills per day, and you can also use benadryl over the counter for itching or also to help with sleep.   Precautions:  If you experience chest pain or shortness of breath - call 911 immediately for transfer to the hospital emergency department!!  If you develop a fever greater that 101 F, purulent drainage from wound, increased redness or drainage from wound, or calf pain -- Call the office at [phone]                                                Follow- Up Appointment:  Please call for an appointment to be seen in 2 weeks [place] - [phone]   Regional Anesthesia Blocks  1. Numbness or the inability to move the "blocked" extremity may last from 3-48 hours after placement. The length of time depends on the medication injected and your individual response to the  medication. If the numbness is not going away after 48 hours, call your surgeon.  2. The extremity that is blocked will need to be protected until the numbness is gone and the  Strength has returned. Because you cannot feel it, you will need to take extra care to avoid injury. Because it may be weak, you may have difficulty moving it or using it. You may not know what position it is in without looking at it while the block is in effect.  3. For blocks in the legs and feet, returning to weight bearing and walking needs to be done carefully. You will need to wait until the numbness is entirely gone and the strength has returned. You should be able to move your leg and foot normally before you try and bear weight or walk. You will need someone to be with you when you first try to ensure you do not fall and possibly risk injury.  4. Bruising and tenderness at the needle site are common side effects and will resolve in a few days.  5. Persistent numbness or new problems with movement should be communicated to the surgeon or the [place] [phone] [facility] [phone]).    Post Anesthesia Home Care Instructions  Activity: Get plenty of rest for the remainder of the day. A responsible adult should stay with you for 24 hours following the procedure.  For the next 24 hours, DO NOT: -Drive a car -Paediatric nurse -Drink alcoholic beverages -Take any medication unless instructed by your physician -Make any legal decisions or sign important papers.  Meals: Start with liquid foods such as gelatin or soup. Progress to regular foods as tolerated. Avoid greasy, spicy, heavy foods. If nausea and/or vomiting occur, drink only clear liquids until the nausea and/or vomiting subsides. Call your physician if vomiting continues.  Special Instructions/Symptoms: Your throat may feel dry or sore from the anesthesia or the breathing tube placed in your throat during surgery.  If this causes discomfort, gargle with warm salt water. The discomfort should disappear within 24 hours.

## 2014-05-05 NOTE — Progress Notes (Signed)
Assisted Dr. Crews with left, ultrasound guided, interscalene  block. Side rails up, monitors on throughout procedure. See vital signs in flow sheet. Tolerated Procedure well. 

## 2014-05-06 LAB — POCT HEMOGLOBIN-HEMACUE: HEMOGLOBIN: 13.4 g/dL (ref 12.0–15.0)

## 2014-05-07 ENCOUNTER — Encounter (HOSPITAL_BASED_OUTPATIENT_CLINIC_OR_DEPARTMENT_OTHER): Payer: Self-pay | Admitting: Orthopedic Surgery

## 2014-07-03 HISTORY — PX: MELANOMA EXCISION: SHX5266

## 2014-12-11 ENCOUNTER — Telehealth: Payer: Self-pay | Admitting: Family Medicine

## 2014-12-11 DIAGNOSIS — L989 Disorder of the skin and subcutaneous tissue, unspecified: Secondary | ICD-10-CM

## 2014-12-11 NOTE — Telephone Encounter (Signed)
She has a spot on the left lower leg that has been present for years but lately it is getting darker. On exam she has a macular lesion which is dark brown but has flecks of black within it. 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

## 2015-01-08 ENCOUNTER — Telehealth: Payer: Self-pay | Admitting: *Deleted

## 2015-01-08 NOTE — Telephone Encounter (Signed)
Received skin biopsy report from [facility] stating left shin showed Melanoma in Situ, lentigo maligna. Dutch Quint aware. Document will be be scanned to the chart.

## 2015-08-02 ENCOUNTER — Telehealth: Payer: Self-pay | Admitting: Family Medicine

## 2015-08-02 MED ORDER — AZITHROMYCIN 250 MG PO TABS: ORAL_TABLET | ORAL | Status: DC

## 2015-08-02 NOTE — Telephone Encounter (Signed)
She has sinus congestion and PND and fever, consistent wit a sinus infection

## 2015-11-30 ENCOUNTER — Encounter: Payer: Self-pay | Admitting: Family Medicine

## 2015-11-30 ENCOUNTER — Ambulatory Visit (INDEPENDENT_AMBULATORY_CARE_PROVIDER_SITE_OTHER): Payer: Self-pay | Admitting: Family Medicine

## 2015-11-30 ENCOUNTER — Telehealth: Payer: Self-pay | Admitting: Family

## 2015-11-30 VITALS — BP 138/96 | HR 68 | Temp 98.6°F | Ht 65.0 in | Wt 133.0 lb

## 2015-11-30 DIAGNOSIS — F4321 Adjustment disorder with depressed mood: Secondary | ICD-10-CM

## 2015-11-30 MED ORDER — FLUOXETINE HCL 20 MG PO TABS: 20.0000 mg | ORAL_TABLET | Freq: Every day | ORAL | Status: DC

## 2015-11-30 MED ORDER — LORAZEPAM 0.5 MG PO TABS: 0.5000 mg | ORAL_TABLET | Freq: Three times a day (TID) | ORAL | Status: DC | PRN

## 2015-11-30 NOTE — Progress Notes (Signed)
Pre visit review using our clinic review tool, if applicable. No additional management support is needed unless otherwise documented below in the visit note. 

## 2015-11-30 NOTE — Telephone Encounter (Signed)
Pt has been scheduled.  °

## 2015-11-30 NOTE — Telephone Encounter (Signed)
Pt states she lost her mom last night and needs to see dr fry asap. Today. Pt used to see padonda. Please advise

## 2015-11-30 NOTE — Progress Notes (Signed)
   Subjective:    Patient ID: Kim Elliott, female    DOB: 1966/03/23, 50 y.o.   MRN: [id number]  HPI Here asking for help with depression and anxiety related to the recent death of her mother. She had lived in [place] and she had been ill for about a year. She passed away over the weekend and her funeral will be later this week. She has been very upset with crying and shaking, her appetite is decreased and she has trouble sleeping. She had used Prozac successfully for PMS symptoms some years ago.    Review of Systems  Constitutional: Negative.   Respiratory: Negative.   Cardiovascular: Negative.   Neurological: Negative.   Psychiatric/Behavioral: Positive for sleep disturbance and dysphoric mood. Negative for suicidal ideas, hallucinations, confusion, self-injury, decreased concentration and agitation. The patient is nervous/anxious.        Objective:   Physical Exam  Constitutional: She is oriented to person, place, and time. She appears well-developed and well-nourished.  Neurological: She is alert and oriented to person, place, and time.  Psychiatric: Her behavior is normal. Judgment and thought content normal.  Mildly anxious           Assessment & Plan:  Grief reaction. Start on Prozac 20 mg daily and add Lorazepam 0.5 mg as needed. I also suggested she speak with a therapist when she gets back to town.  Kim Morale, MD

## 2015-11-30 NOTE — Telephone Encounter (Signed)
Per Dr. Sarajane Jews okay to schedule, need a 30 minute.

## 2016-03-15 ENCOUNTER — Encounter: Payer: Self-pay | Admitting: Family Medicine

## 2016-03-15 ENCOUNTER — Ambulatory Visit (INDEPENDENT_AMBULATORY_CARE_PROVIDER_SITE_OTHER): Payer: Self-pay | Admitting: Family Medicine

## 2016-03-15 VITALS — BP 142/94 | HR 80 | Temp 98.2°F | Ht 65.0 in | Wt 141.0 lb

## 2016-03-15 DIAGNOSIS — Z23 Encounter for immunization: Secondary | ICD-10-CM

## 2016-03-15 DIAGNOSIS — W540XXA Bitten by dog, initial encounter: Principal | ICD-10-CM

## 2016-03-15 DIAGNOSIS — S61452A Open bite of left hand, initial encounter: Secondary | ICD-10-CM

## 2016-03-15 MED ORDER — FLUOXETINE HCL 20 MG PO TABS: 20.0000 mg | ORAL_TABLET | Freq: Every day | ORAL | 3 refills | Status: DC

## 2016-03-15 MED ORDER — AMOXICILLIN-POT CLAVULANATE 875-125 MG PO TABS: 1.0000 | ORAL_TABLET | Freq: Two times a day (BID) | ORAL | 0 refills | Status: DC

## 2016-03-15 MED ORDER — CEFTRIAXONE SODIUM 1 G IJ SOLR
1.0000 g | Freq: Once | INTRAMUSCULAR | Status: AC
  Administered 2016-03-15: 1 g via INTRAMUSCULAR

## 2016-03-15 MED ORDER — LORAZEPAM 0.5 MG PO TABS: 0.5000 mg | ORAL_TABLET | Freq: Three times a day (TID) | ORAL | 5 refills | Status: DC | PRN

## 2016-03-15 NOTE — Progress Notes (Signed)
Pre visit review using our clinic review tool, if applicable. No additional management support is needed unless otherwise documented below in the visit note. 

## 2016-03-15 NOTE — Progress Notes (Signed)
   Subjective:    Patient ID: Kim Elliott, female    DOB: June 05, 1966, 50 y.o.   MRN: [id number]  HPI Here for a dog bite to the left hand which occurred at home last night. She was separating 2 dogs and one of them bit her. Otherwise the dog's behavior has been normal and his shots are up to date. She washed it last night with soap and water and applied ice. Today the hand is swollen and painful. No fever.    Review of Systems  Constitutional: Negative.   Skin: Positive for wound.       Objective:   Physical Exam  Constitutional: She appears well-developed and well-nourished.  Musculoskeletal:  The left hand has a puncture wound in the space between the first and second metacarpals. This area is swollen and quite tender. No erythema or warmth. No streaking up the arm. ROM of the fingers is full           Assessment & Plan:  Dog bite to the hand with early cellulitis. Given a shot of Rocephin and she will follow this with 10 days of Augmentin. Given a TDaP. She can use ice and Ibuprofen prn. Recheck prn.  Kim Morale, MD

## 2016-04-02 ENCOUNTER — Other Ambulatory Visit: Payer: Self-pay | Admitting: Family Medicine

## 2016-06-27 ENCOUNTER — Ambulatory Visit (INDEPENDENT_AMBULATORY_CARE_PROVIDER_SITE_OTHER): Payer: Self-pay | Admitting: Family Medicine

## 2016-06-27 VITALS — BP 130/80 | HR 104 | Temp 98.1°F | Ht 65.0 in | Wt 146.0 lb

## 2016-06-27 DIAGNOSIS — R059 Cough, unspecified: Secondary | ICD-10-CM

## 2016-06-27 DIAGNOSIS — R05 Cough: Secondary | ICD-10-CM

## 2016-06-27 MED ORDER — DOXYCYCLINE HYCLATE 100 MG PO CAPS: 100.0000 mg | ORAL_CAPSULE | Freq: Two times a day (BID) | ORAL | 0 refills | Status: DC

## 2016-06-27 NOTE — Progress Notes (Signed)
Pre visit review using our clinic review tool, if applicable. No additional management support is needed unless otherwise documented below in the visit note. 

## 2016-06-27 NOTE — Progress Notes (Signed)
Subjective:     Patient ID: Kim Elliott, female   DOB: 1965-11-24, 50 y.o.   MRN: [id number]  HPI Patient nonsmoker seen with onset last Friday of chills, fatigue, night sweats, cough. She has had some nasal drainage. She's had one episode of vomiting and diarrhea last weekend but none since then. Her cough has been fairly severe at times. She had subjective fever. She does not have a thermometer and has not taken her temperature. She's had night sweats for each of the last 4 nights  Past Medical History:  Diagnosis Date  . Closed fracture of left clavicle 05/05/2014  . Medical history non-contributory    Past Surgical History:  Procedure Laterality Date  . BREAST SURGERY  1995   lt br bx-neg  . DILATION AND CURETTAGE OF UTERUS     x2post misscarage  . ORIF CLAVICULAR FRACTURE Left 05/05/2014   Procedure: OPEN REDUCTION INTERNAL FIXATION (ORIF) LEFT  CLAVICULAR FRACTURE;  Surgeon: Johnny Bridge, MD;  Location: [facility];  Service: Orthopedics;  Laterality: Left;  Marland Kitchen VEIN LIGATION AND STRIPPING     legs    reports that she quit smoking about 32 years ago. She has never used smokeless tobacco. She reports that she drinks alcohol. She reports that she does not use drugs. family history is not on file. Allergies  Allergen Reactions  . Codeine Nausea And Vomiting  . Oxycodone     hallucinations  . Vicodin [Hydrocodone-Acetaminophen]     hallucinations     Review of Systems  Constitutional: Positive for fatigue.  HENT: Positive for congestion.   Respiratory: Positive for cough. Negative for shortness of breath and wheezing.        Objective:   Physical Exam  Constitutional: She appears well-developed and well-nourished.  HENT:  Right Ear: External ear normal.  Left Ear: External ear normal.  Mouth/Throat: Oropharynx is clear and moist.  Neck: Neck supple.  Cardiovascular: Normal rate and regular rhythm.   Pulmonary/Chest: Effort normal and breath sounds  normal. No respiratory distress. She has no wheezes. She has no rales.       Assessment:     Cough. Differential is viral process versus bacterial    Plan:     -We discussed pros and cons of chest x-ray but she has nonfocal exam this time and nontoxic in appearance -We elected to go and cover with doxycycline given duration of subjective fever. If not promptly improving in 2-3 days would recommend chest x-ray to further evaluate.  -She will continue over-the-counter cough medications as needed  Eulas Post MD [facility]

## 2016-07-10 ENCOUNTER — Ambulatory Visit (INDEPENDENT_AMBULATORY_CARE_PROVIDER_SITE_OTHER): Payer: Self-pay | Admitting: Family Medicine

## 2016-07-10 ENCOUNTER — Encounter: Payer: Self-pay | Admitting: Family Medicine

## 2016-07-10 VITALS — BP 149/98 | HR 74 | Temp 98.1°F | Ht 65.0 in | Wt 147.0 lb

## 2016-07-10 DIAGNOSIS — S61411A Laceration without foreign body of right hand, initial encounter: Secondary | ICD-10-CM

## 2016-07-10 DIAGNOSIS — L03113 Cellulitis of right upper limb: Secondary | ICD-10-CM

## 2016-07-10 MED ORDER — CEFTRIAXONE SODIUM 1 G IJ SOLR
1.0000 g | Freq: Once | INTRAMUSCULAR | Status: AC
  Administered 2016-07-10: 1 g via INTRAMUSCULAR

## 2016-07-10 MED ORDER — AMOXICILLIN-POT CLAVULANATE 875-125 MG PO TABS: 1.0000 | ORAL_TABLET | Freq: Two times a day (BID) | ORAL | 0 refills | Status: DC

## 2016-07-10 MED ORDER — TRAMADOL HCL 50 MG PO TABS: 100.0000 mg | ORAL_TABLET | Freq: Four times a day (QID) | ORAL | 0 refills | Status: DC | PRN

## 2016-07-10 NOTE — Progress Notes (Signed)
   Subjective:    Patient ID: Lauree Chandler, female    DOB: 30-Jan-1966, 51 y.o.   MRN: [id number]  HPI Here for a dog bite to the right hand tat occurred at home last night. She was breaking up a fight between her 2 dogs and she was bitten by one of them. Their shots are up to date. The hand has been very painful. Shew cleaned the wounds with peroxide and dressed them with Neosporin. This same scenario happened last October and we treated it with Augmentin.    Review of Systems  Constitutional: Negative.   Musculoskeletal: Positive for joint swelling.  Skin: Positive for wound.       Objective:   Physical Exam  Constitutional: She appears well-developed and well-nourished.  Musculoskeletal:  The right hand has a superficial puncture wound on the dorsum which is swollen and tender. The right index finger has a deeper puncture wound with a burst type injury to the distal half of the finger. This is swollen, pink, and quite tender. The 3rd through 5th fingers have full ROM. The index finger has limited ROM due to pain.           Assessment & Plan:  Dog bites to the hand causing cellulitis. Given a shot of Rocephin and she sill start on Augmentin bid. Wash the wounds bid with soap and water and dress them with Neosporin. Use Tramadol for pain. She is written out of work all this week. Recheck here in 2 days.  Alysia Penna, MD

## 2016-07-10 NOTE — Addendum Note (Signed)
Addended by: Aggie Hacker A on: 07/10/2016 12:27 PM   Modules accepted: Orders

## 2016-07-10 NOTE — Progress Notes (Signed)
Pre visit review using our clinic review tool, if applicable. No additional management support is needed unless otherwise documented below in the visit note. 

## 2016-07-12 ENCOUNTER — Ambulatory Visit (INDEPENDENT_AMBULATORY_CARE_PROVIDER_SITE_OTHER): Payer: Self-pay | Admitting: Family Medicine

## 2016-07-12 VITALS — BP 145/100 | HR 71 | Temp 98.1°F | Ht 65.0 in | Wt 147.0 lb

## 2016-07-12 DIAGNOSIS — L03113 Cellulitis of right upper limb: Secondary | ICD-10-CM

## 2016-07-12 DIAGNOSIS — S61451D Open bite of right hand, subsequent encounter: Secondary | ICD-10-CM

## 2016-07-12 DIAGNOSIS — W540XXD Bitten by dog, subsequent encounter: Secondary | ICD-10-CM

## 2016-07-13 ENCOUNTER — Encounter: Payer: Self-pay | Admitting: Family Medicine

## 2016-07-13 NOTE — Progress Notes (Signed)
   Subjective:    Patient ID: Kim Elliott, female    DOB: 07-16-1965, 51 y.o.   MRN: [id number]  HPI Here to recheck a dog bite injury to the right hand. This occurred at home 3 days ago while she was attempting to break up a fight between her 2 dogs. She was seen here 2 days ago and was given a shot of Rocephin. She has also been taking Augmentin. The ares seem to be slowly improving. The hand and finger are less swollen and she has less pain. She is dressing the area with Neosporin and gauze bid.    Review of Systems  Constitutional: Negative.   Skin: Positive for wound.       Objective:   Physical Exam  Constitutional: She appears well-developed and well-nourished.  Musculoskeletal:  The dorsum of the right hand has a superficial wound which is tender but looks clean. The swelling has reduced a bit. The right index finger is still swollen and tender. But looks clean. Her ROM of this finger has improved slightly.           Assessment & Plan:  She is slowly recovering from a dog bite injury to the right hand. No signs of infection. The swelling is slowly decreasing. She will stay out of work all this week. Recheck here next Monday.  Alysia Penna, MD

## 2016-07-17 ENCOUNTER — Encounter: Payer: Self-pay | Admitting: Family Medicine

## 2016-07-17 ENCOUNTER — Ambulatory Visit (INDEPENDENT_AMBULATORY_CARE_PROVIDER_SITE_OTHER): Payer: Self-pay | Admitting: Family Medicine

## 2016-07-17 VITALS — BP 131/88 | HR 76 | Temp 98.7°F | Ht 65.0 in | Wt 147.0 lb

## 2016-07-17 DIAGNOSIS — L03113 Cellulitis of right upper limb: Secondary | ICD-10-CM

## 2016-07-17 NOTE — Progress Notes (Signed)
   Subjective:    Patient ID: Kim Elliott, female    DOB: 1966-03-26, 51 y.o.   MRN: [id number]  HPI Here to follow up on cellulitis which was the result of a dog bite on the right hand on 07-09-16. She was seen on 07-10-16, and she received a Rocephin shot. She has also been taking Augmentin since then, and day by day she feels a little better. No fever. The pain in the finger has improved.    Review of Systems  Constitutional: Negative.   Skin: Positive for wound.       Objective:   Physical Exam  Constitutional: She appears well-developed and well-nourished.  Musculoskeletal:  Th right hand continues to improve slowly. The wound on the dorsum of the hand is only slightly tender and the swelling has resolved. No erythema. The wound on the index finger is still swollen but not as much as before. It is tender but less so. No erythema. Her ROM has improved though not back to normal          Assessment & Plan:  The cellulitis has resolved. She will finish up the last few days of Augmentin. The swelling improves steadily. She will return to work tomorrow. Recheck prn.  Alysia Penna, MD

## 2016-07-17 NOTE — Progress Notes (Signed)
Pre visit review using our clinic review tool, if applicable. No additional management support is needed unless otherwise documented below in the visit note. 

## 2017-02-17 ENCOUNTER — Other Ambulatory Visit: Payer: Self-pay | Admitting: Family Medicine

## 2017-02-20 NOTE — Telephone Encounter (Signed)
Call in #[phone] with 5 rf 

## 2017-06-04 ENCOUNTER — Other Ambulatory Visit: Payer: Self-pay | Admitting: Family Medicine

## 2017-06-21 ENCOUNTER — Other Ambulatory Visit: Payer: Self-pay

## 2017-06-21 ENCOUNTER — Telehealth: Payer: Self-pay | Admitting: Family Medicine

## 2017-06-21 MED ORDER — FLUOXETINE HCL 20 MG PO TABS: 20.0000 mg | ORAL_TABLET | Freq: Every day | ORAL | 0 refills | Status: DC

## 2017-06-21 NOTE — Telephone Encounter (Signed)
Sent in Rx for a 90 day supply pt is aware that she is due for a wellness OV soon to be able to get more refills. Rx sent.

## 2017-06-21 NOTE — Telephone Encounter (Signed)
Requesting  Medication  Refill change  Request  Last refill  06/05/2017

## 2017-06-21 NOTE — Telephone Encounter (Signed)
Copied from [place] [id number]. Topic: Quick Communication - See Telephone Encounter >> Jun 21, 2017  9:07 AM Ivar Drape wrote: CRM for notification. See Telephone encounter for:  06/21/17. Pt said Dr. Sarajane Jews wrote her a prescription for Fluoxetine 20mg  for one month, but her Pharmacy, [facility] [address] orders 3 month supplies, which is cheaper than the one month.  Pt wants to know if Dr. Sarajane Jews will send in a new prescription for the Fluoxetine 20mg  for a 35mth supply.  Please advise.  [phone]

## 2017-07-13 ENCOUNTER — Ambulatory Visit: Payer: Self-pay | Admitting: Family Medicine

## 2017-07-18 ENCOUNTER — Ambulatory Visit: Payer: Self-pay | Admitting: Family Medicine

## 2017-07-20 ENCOUNTER — Encounter: Payer: Self-pay | Admitting: Family Medicine

## 2017-07-20 ENCOUNTER — Ambulatory Visit (INDEPENDENT_AMBULATORY_CARE_PROVIDER_SITE_OTHER): Payer: Self-pay | Admitting: Family Medicine

## 2017-07-20 VITALS — BP 140/68 | HR 75 | Temp 98.4°F | Wt 161.0 lb

## 2017-07-20 DIAGNOSIS — F418 Other specified anxiety disorders: Secondary | ICD-10-CM | POA: Insufficient documentation

## 2017-07-20 DIAGNOSIS — L989 Disorder of the skin and subcutaneous tissue, unspecified: Secondary | ICD-10-CM

## 2017-07-20 DIAGNOSIS — J018 Other acute sinusitis: Secondary | ICD-10-CM

## 2017-07-20 MED ORDER — AZITHROMYCIN 250 MG PO TABS
ORAL_TABLET | ORAL | 0 refills | Status: DC
Start: 2017-07-20 — End: 2017-09-07

## 2017-07-20 MED ORDER — LORAZEPAM 1 MG PO TABS: 1.0000 mg | ORAL_TABLET | Freq: Two times a day (BID) | ORAL | 1 refills | Status: DC | PRN

## 2017-07-20 MED ORDER — FLUOXETINE HCL 20 MG PO TABS: 20.0000 mg | ORAL_TABLET | Freq: Every day | ORAL | 3 refills | Status: DC

## 2017-07-20 NOTE — Progress Notes (Signed)
   Subjective:    Patient ID: Kim Elliott, female    DOB: 1966/01/06, 52 y.o.   MRN: [id number]  HPI Here for several issues. First she has had sinus pressure and PND for several weeks, and now both ears hurt. No cough or fever. On Claritin and Mucinex. Second her depression has been stable and she asks to have the Prozac refilled. The Ativan helps her anxiety but she would like to increase the dose if possible. Last she noticed a spot appear on her left hand about 2 weeks ago. She is concerned because she has a hx of melanoma.    Review of Systems  Constitutional: Negative.   HENT: Positive for congestion, ear pain, postnasal drip, sinus pressure and sinus pain. Negative for sore throat.   Eyes: Negative.   Respiratory: Negative.   Cardiovascular: Negative.   Skin: Positive for color change.  Psychiatric/Behavioral: Positive for dysphoric mood. Negative for agitation, confusion, decreased concentration and sleep disturbance. The patient is nervous/anxious.        Objective:   Physical Exam  Constitutional: She appears well-developed and well-nourished.  HENT:  Right Ear: External ear normal.  Left Ear: External ear normal.  Nose: Nose normal.  Mouth/Throat: Oropharynx is clear and moist.  Eyes: Conjunctivae are normal.  Neck: No thyromegaly present.  Pulmonary/Chest: Effort normal and breath sounds normal. No respiratory distress. She has no wheezes. She has no rales.  Lymphadenopathy:    She has no cervical adenopathy.  Skin:  The dorsal left hand has a 2 mm macular light brown spot which is poorly demarcated   Psychiatric: She has a normal mood and affect. Her behavior is normal. Thought content normal.          Assessment & Plan:  For the sinusitis, she will take a Zpack. For the depression and anxiety she will stay on Prozac 20 mg daily but we will increase the Ativan to 1 mg bid. The spot on her hand is worrisome so I advised her to see her Dermatologist ASAP, and she  agreed.  Alysia Penna, MD

## 2017-09-07 ENCOUNTER — Ambulatory Visit (INDEPENDENT_AMBULATORY_CARE_PROVIDER_SITE_OTHER): Payer: Self-pay | Admitting: Family Medicine

## 2017-09-07 ENCOUNTER — Encounter: Payer: Self-pay | Admitting: Family Medicine

## 2017-09-07 VITALS — BP 120/80 | HR 98 | Temp 100.3°F | Wt 163.4 lb

## 2017-09-07 DIAGNOSIS — J209 Acute bronchitis, unspecified: Secondary | ICD-10-CM

## 2017-09-07 DIAGNOSIS — J029 Acute pharyngitis, unspecified: Secondary | ICD-10-CM

## 2017-09-07 DIAGNOSIS — R6889 Other general symptoms and signs: Secondary | ICD-10-CM

## 2017-09-07 LAB — POC INFLUENZA A&B (BINAX/QUICKVUE)
INFLUENZA B, POC: NEGATIVE
Influenza A, POC: NEGATIVE

## 2017-09-07 LAB — POCT RAPID STREP A (OFFICE): Rapid Strep A Screen: NEGATIVE

## 2017-09-07 MED ORDER — AZITHROMYCIN 250 MG PO TABS: ORAL_TABLET | ORAL | 0 refills | Status: DC

## 2017-09-07 NOTE — Progress Notes (Signed)
   Subjective:    Patient ID: Lauree Chandler, female    DOB: October 31, 1965, 52 y.o.   MRN: [id number]  HPI Here for 3 days of fever, body aches, chest congestion and coughing up green sputum. No NVD.    Review of Systems  Constitutional: Positive for fever.  HENT: Negative.   Eyes: Negative.   Respiratory: Positive for cough and chest tightness. Negative for shortness of breath and wheezing.   Cardiovascular: Negative.   Gastrointestinal: Negative.        Objective:   Physical Exam  Constitutional: She appears well-developed and well-nourished.  HENT:  Right Ear: External ear normal.  Left Ear: External ear normal.  Nose: Nose normal.  Mouth/Throat: Oropharynx is clear and moist.  Eyes: Conjunctivae are normal.  Neck: No thyromegaly present.  Pulmonary/Chest: Effort normal. No respiratory distress. She has no wheezes. She has no rales.  Scattered rhonchi   Lymphadenopathy:    She has no cervical adenopathy.          Assessment & Plan:  Bronchitis, treat with a Zpack. Use Ibuprofen and Delsym prn.  Alysia Penna, MD

## 2018-01-03 ENCOUNTER — Other Ambulatory Visit: Payer: Self-pay | Admitting: Family Medicine

## 2018-01-04 NOTE — Telephone Encounter (Signed)
Rx done. 

## 2018-01-04 NOTE — Telephone Encounter (Signed)
Call in #[phone] with one rf  

## 2018-01-04 NOTE — Telephone Encounter (Signed)
Last refill given on 1/18 for #180 with 1 ref

## 2018-02-26 ENCOUNTER — Encounter: Payer: Self-pay | Admitting: Family Medicine

## 2018-02-26 ENCOUNTER — Ambulatory Visit (INDEPENDENT_AMBULATORY_CARE_PROVIDER_SITE_OTHER): Payer: Self-pay | Admitting: Family Medicine

## 2018-02-26 VITALS — BP 138/90 | HR 105 | Temp 98.9°F | Ht 65.0 in | Wt 162.4 lb

## 2018-02-26 DIAGNOSIS — H00012 Hordeolum externum right lower eyelid: Secondary | ICD-10-CM

## 2018-02-26 DIAGNOSIS — F418 Other specified anxiety disorders: Secondary | ICD-10-CM

## 2018-02-26 MED ORDER — ERYTHROMYCIN 5 MG/GM OP OINT: 1.0000 "application " | TOPICAL_OINTMENT | Freq: Two times a day (BID) | OPHTHALMIC | 0 refills | Status: DC

## 2018-02-26 MED ORDER — FLUOXETINE HCL 40 MG PO CAPS: 40.0000 mg | ORAL_CAPSULE | Freq: Every day | ORAL | 3 refills | Status: DC

## 2018-02-26 NOTE — Progress Notes (Signed)
   Subjective:    Patient ID: Kim Elliott, female    DOB: 1966/03/30, 52 y.o.   MRN: [id number]  HPI Here to discuss her medications. She has been under a lot of stress for the past few months. Her father, who lives in [place], is being treated for multiple myeloma and she has been living with her parents in [place] since last year. 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 She had been happy with Prozac 20 mg a day in the past, but now it does not seem to be strong enough. She is anxious and she cannot sleep. The Lorazem helps and she takes this once or twice a day. Also yesterday she felt some pain in the corner of her right eye. No vision problems.    Review of Systems  Constitutional: Negative.   HENT: Negative for congestion, ear pain, postnasal drip, sinus pain and sore throat.   Eyes: Positive for pain. Negative for photophobia, discharge, redness, itching and visual disturbance.  Respiratory: Negative.   Psychiatric/Behavioral: Positive for decreased concentration and sleep disturbance. Negative for agitation, confusion, dysphoric mood and hallucinations. The patient is nervous/anxious.        Objective:   Physical Exam  Constitutional: She is oriented to person, place, and time. She appears well-developed and well-nourished.  HENT:  Right Ear: External ear normal.  Left Ear: External ear normal.  Nose: Nose normal.  Mouth/Throat: Oropharynx is clear and moist.  Eyes: Conjunctivae are normal.  The right lower eyelid is slightly swollen and tender near the medial canthus   Neck: Neck supple. No thyromegaly present.  Lymphadenopathy:    She has no cervical adenopathy.  Neurological: She is alert and oriented to person, place, and time.  Psychiatric: She has a normal mood and affect. Her behavior is normal. Thought content normal.          Assessment & Plan:  For the depression and anxiety, we will  increase the Prozac to 40 mg daily. She can use the Lorazepam prn. She has an early stye so she will use warm compresses and Erythromycin ointment prn.  Alysia Penna, MD

## 2018-03-01 ENCOUNTER — Other Ambulatory Visit: Payer: Self-pay | Admitting: Family Medicine

## 2018-03-05 NOTE — Telephone Encounter (Signed)
Last fill 01/04/18 Last OV 02/26/18  Ok to fill?

## 2018-03-06 NOTE — Telephone Encounter (Signed)
Rx has been sent in. 

## 2018-03-06 NOTE — Telephone Encounter (Signed)
Call in #[phone] with one rf  

## 2018-04-18 ENCOUNTER — Emergency Department (HOSPITAL_COMMUNITY)
Admission: EM | Admit: 2018-04-18 | Discharge: 2018-04-19 | Disposition: A | Discharge status: Other Acute Inpatient Hospital | Payer: Medicaid Other | Attending: Emergency Medicine | Admitting: Emergency Medicine

## 2018-04-18 ENCOUNTER — Encounter (HOSPITAL_COMMUNITY): Payer: Self-pay | Admitting: Obstetrics and Gynecology

## 2018-04-18 ENCOUNTER — Other Ambulatory Visit: Payer: Self-pay

## 2018-04-18 DIAGNOSIS — F39 Unspecified mood [affective] disorder: Secondary | ICD-10-CM

## 2018-04-18 DIAGNOSIS — Z79899 Other long term (current) drug therapy: Secondary | ICD-10-CM | POA: Insufficient documentation

## 2018-04-18 DIAGNOSIS — Z85828 Personal history of other malignant neoplasm of skin: Secondary | ICD-10-CM | POA: Insufficient documentation

## 2018-04-18 DIAGNOSIS — F418 Other specified anxiety disorders: Secondary | ICD-10-CM | POA: Diagnosis present

## 2018-04-18 DIAGNOSIS — Z046 Encounter for general psychiatric examination, requested by authority: Secondary | ICD-10-CM | POA: Insufficient documentation

## 2018-04-18 DIAGNOSIS — Y903 Blood alcohol level of 60-79 mg/100 ml: Secondary | ICD-10-CM | POA: Insufficient documentation

## 2018-04-18 DIAGNOSIS — F10920 Alcohol use, unspecified with intoxication, uncomplicated: Secondary | ICD-10-CM | POA: Insufficient documentation

## 2018-04-18 DIAGNOSIS — Z87891 Personal history of nicotine dependence: Secondary | ICD-10-CM | POA: Insufficient documentation

## 2018-04-18 LAB — RAPID URINE DRUG SCREEN, HOSP PERFORMED
AMPHETAMINES: NOT DETECTED
BENZODIAZEPINES: POSITIVE — AB
Barbiturates: NOT DETECTED
Cocaine: NOT DETECTED
OPIATES: NOT DETECTED
Tetrahydrocannabinol: NOT DETECTED

## 2018-04-18 LAB — CBC
HCT: 38.7 % (ref 36.0–46.0)
Hemoglobin: 12.4 g/dL (ref 12.0–15.0)
MCH: 28.9 pg (ref 26.0–34.0)
MCHC: 32 g/dL (ref 30.0–36.0)
MCV: 90.2 fL (ref 80.0–100.0)
NRBC: 0 % (ref 0.0–0.2)
PLATELETS: 274 10*3/uL (ref 150–400)
RBC: 4.29 MIL/uL (ref 3.87–5.11)
RDW: 15.1 % (ref 11.5–15.5)
WBC: 5 10*3/uL (ref 4.0–10.5)

## 2018-04-18 LAB — COMPREHENSIVE METABOLIC PANEL
ALK PHOS: 65 U/L (ref 38–126)
ALT: 21 U/L (ref 0–44)
ANION GAP: 12 (ref 5–15)
AST: 24 U/L (ref 15–41)
Albumin: 4 g/dL (ref 3.5–5.0)
BUN: 14 mg/dL (ref 6–20)
CALCIUM: 9.4 mg/dL (ref 8.9–10.3)
CO2: 21 mmol/L — ABNORMAL LOW (ref 22–32)
Chloride: 107 mmol/L (ref 98–111)
Creatinine, Ser: 0.61 mg/dL (ref 0.44–1.00)
GFR calc Af Amer: >60 mL/min (ref >60)
Glucose, Bld: 119 mg/dL — ABNORMAL HIGH (ref 70–99)
POTASSIUM: 3.9 mmol/L (ref 3.5–5.1)
Sodium: 140 mmol/L (ref 135–145)
TOTAL PROTEIN: 7.7 g/dL (ref 6.5–8.1)

## 2018-04-18 LAB — ETHANOL: ALCOHOL ETHYL (B): 76 mg/dL — AB (ref <10)

## 2018-04-18 LAB — I-STAT BETA HCG BLOOD, ED (MC, WL, AP ONLY): I-stat hCG, quantitative: <5 m[IU]/mL (ref <5)

## 2018-04-18 MED ORDER — ONDANSETRON HCL 4 MG PO TABS: 4.0000 mg | ORAL_TABLET | Freq: Three times a day (TID) | ORAL | Status: DC | PRN

## 2018-04-18 MED ORDER — LORAZEPAM 2 MG/ML IJ SOLN: 0.0000 mg | Freq: Four times a day (QID) | INTRAMUSCULAR | Status: DC

## 2018-04-18 MED ORDER — ALUM & MAG HYDROXIDE-SIMETH 200-200-20 MG/5ML PO SUSP: 30.0000 mL | Freq: Four times a day (QID) | ORAL | Status: DC | PRN

## 2018-04-18 MED ORDER — IBUPROFEN 200 MG PO TABS
600.0000 mg | ORAL_TABLET | Freq: Three times a day (TID) | ORAL | Status: DC | PRN
  Administered 2018-04-19: 600 mg via ORAL
  Filled 2018-04-18: qty 3

## 2018-04-18 MED ORDER — VITAMIN B-1 100 MG PO TABS
100.0000 mg | ORAL_TABLET | Freq: Every day | ORAL | Status: DC
  Filled 2018-04-18: qty 1

## 2018-04-18 MED ORDER — LORAZEPAM 1 MG PO TABS: 0.0000 mg | ORAL_TABLET | Freq: Two times a day (BID) | ORAL | Status: DC

## 2018-04-18 MED ORDER — THIAMINE HCL 100 MG/ML IJ SOLN: 100.0000 mg | Freq: Every day | INTRAMUSCULAR | Status: DC

## 2018-04-18 MED ORDER — LORAZEPAM 1 MG PO TABS: 0.0000 mg | ORAL_TABLET | Freq: Four times a day (QID) | ORAL | Status: DC

## 2018-04-18 MED ORDER — LORAZEPAM 2 MG/ML IJ SOLN: 0.0000 mg | Freq: Two times a day (BID) | INTRAMUSCULAR | Status: DC

## 2018-04-18 NOTE — BH Assessment (Addendum)
Assessment Note  Kim Elliott is an 52 y.o. female who presents to the ED under IVC initiated by her daughter. According to the IVC, the respondent "indicated may kill herself in a text to her children earlier today. Drinking on average one bottle of wine per day and operating a motor vehicle while under influence. Verbally aggressive towards daughter telling her to kill herself and leave her alone." Pt is crying hysterically during the assessment and states that she did not tell her daughter she was suicidal. Pt denies SI, HI, and AVH to this Probation officer. Pt states her father recently passed away which she claims is her only stressor. Pt states she does not want to hurt anyone and she wants to be d/c. Pt states she does not know who is going to care for her dogs while she is in the ED and continues to ask this Probation officer when she will be allowed to leave. Pt denies any prior psych history, however per chart review pt was recently evaluated at [facility] [place] on 02/26/18 due to depression and anxiety. Pt denies that she has an ongoing [facility] provider.   TTS consulted with Lindon Romp, NP who recommends continued observation for safety and stabilization and to be reassessed in the AM by psych. EDP Quintella Reichert, MD and pt's nurse have been advised.  Diagnosis: MDD, single episode, moderate, w/o psychosis; GAD, severe  Past Medical History:  Past Medical History:  Diagnosis Date  . Cancer ([place])   . Closed fracture of left clavicle 05/05/2014  . Medical history non-contributory   . Melanoma in situ of left lower leg [facility])     Past Surgical History:  Procedure Laterality Date  . BREAST SURGERY  1995   lt br bx-neg  . DILATION AND CURETTAGE OF UTERUS     x2post misscarage  . MELANOMA EXCISION  2016   left lower leg  . ORIF CLAVICULAR FRACTURE Left 05/05/2014   Procedure: OPEN REDUCTION INTERNAL FIXATION (ORIF) LEFT  CLAVICULAR FRACTURE;  Surgeon: Johnny Bridge, MD;  Location: [place];  Service: Orthopedics;  Laterality: Left;  Marland Kitchen VEIN LIGATION AND STRIPPING     legs    Family History: No family history on file.  Social History:  reports that she quit smoking about 33 years ago. She has never used smokeless tobacco. She reports that she drinks about 21.0 standard drinks of alcohol per week. She reports that she does not use drugs.  Additional Social History:  Alcohol / Drug Use Pain Medications: See MAR Prescriptions: See MAR Over the Counter: See MAR History of alcohol / drug use?: Yes Substance #1 Name of Substance 1: Wine 1 - Age of First Use: 30s 1 - Amount (size/oz): pt reports "a couple of glasses" 1 - Frequency: daily 1 - Duration: ongoing 1 - Last Use / Amount: 04/18/18  CIWA: CIWA-Ar BP: (!) 160/103 Pulse Rate: (!) 105 COWS:    Allergies:  Allergies  Allergen Reactions  . Codeine Nausea And Vomiting  . Oxycodone     hallucinations  . Vicodin [Hydrocodone-Acetaminophen]     hallucinations    Home Medications:  (Not in a hospital admission)  OB/GYN Status:  Patient's last menstrual period was 03/10/2018.  General Assessment Data [facility] of Assessment: [facility] TTS Assessment: In system Is this a Tele or Face-to-Face Assessment?: Face-to-Face Is this an Initial Assessment or a Re-assessment for this encounter?: Initial Assessment Patient Accompanied by:: (alone) Language Other than English: No What gender  do you identify as?: Female Marital status: Single Pregnancy Status: No Living Arrangements: Children Can pt return to current living arrangement?: Yes Admission Status: Involuntary Petitioner: Family member Is patient capable of signing voluntary admission?: No Referral Source: Self/Family/Friend Insurance type: Medicaid     Crisis Care Plan Living Arrangements: Children Name of Psychiatrist: pt denies Name of Therapist: pt denies  Education Status Is patient currently in school?: No Is the patient employed,  unemployed or receiving disability?: Employed  Risk to self with the past 6 months Suicidal Ideation: Yes-Currently Present(per IVC) Has patient been a risk to self within the past 6 months prior to admission? : No Suicidal Intent: No Has patient had any suicidal intent within the past 6 months prior to admission? : No Is patient at risk for suicide?: Yes Suicidal Plan?: No Has patient had any suicidal plan within the past 6 months prior to admission? : No Access to Means: No What has been your use of drugs/alcohol within the last 12 months?: wine Previous Attempts/Gestures: No Triggers for Past Attempts: None known Intentional Self Injurious Behavior: None Family Suicide History: No Recent stressful life event(s): Loss (Comment)(father passed away) Persecutory voices/beliefs?: No Depression: Yes Depression Symptoms: Tearfulness, Isolating, Despondent Substance abuse history and/or treatment for substance abuse?: No Suicide prevention information given to non-admitted patients: Not applicable  Risk to Others within the past 6 months Homicidal Ideation: No Does patient have any lifetime risk of violence toward others beyond the six months prior to admission? : No Thoughts of Harm to Others: No Current Homicidal Intent: No Current Homicidal Plan: No Access to Homicidal Means: No History of harm to others?: No Assessment of Violence: None Noted Does patient have access to weapons?: No Criminal Charges Pending?: No Does patient have a court date: No Is patient on probation?: No  Psychosis Hallucinations: None noted Delusions: None noted  Mental Status Report Appearance/Hygiene: In scrubs, Unremarkable Eye Contact: Good Motor Activity: Freedom of movement Speech: Logical/coherent Level of Consciousness: Alert, Crying, Irritable Mood: Depressed, Anxious, Irritable Affect: Anxious, Depressed, Angry, Irritable Anxiety Level: Severe Thought Processes: Relevant,  Coherent Judgement: Partial Orientation: Person, Place, Situation, Time, Appropriate for developmental age Obsessive Compulsive Thoughts/Behaviors: None  Cognitive Functioning Concentration: Normal Memory: Recent Intact, Remote Intact Is patient IDD: No Insight: Fair Impulse Control: Fair Appetite: Good Have you had any weight changes? : No Change Sleep: No Change Total Hours of Sleep: 8 Vegetative Symptoms: None  ADLScreening [facility] Assessment Services) Patient's cognitive ability adequate to safely complete daily activities?: Yes Patient able to express need for assistance with ADLs?: Yes Independently performs ADLs?: Yes (appropriate for developmental age)  Prior Inpatient Therapy Prior Inpatient Therapy: No  Prior Outpatient Therapy Prior Outpatient Therapy: Yes Prior Therapy Dates: 2019 Prior Therapy Facilty/Provider(s): Therapist, [facility] at [place] Reason for Treatment: Depression, Anxiety  Does patient have an ACCT team?: No Does patient have Intensive In-House Services?  : No Does patient have [facility] services? : No Does patient have P4CC services?: No  ADL Screening (condition at time of admission) Patient's cognitive ability adequate to safely complete daily activities?: Yes Is the patient deaf or have difficulty hearing?: No Does the patient have difficulty seeing, even when wearing glasses/contacts?: No Does the patient have difficulty concentrating, remembering, or making decisions?: No Patient able to express need for assistance with ADLs?: Yes Does the patient have difficulty dressing or bathing?: No Independently performs ADLs?: Yes (appropriate for developmental age) Does the patient have difficulty walking or climbing stairs?: No Weakness of  Legs: None Weakness of Arms/Hands: None  Home Assistive Devices/Equipment Home Assistive Devices/Equipment: None    Abuse/Neglect Assessment (Assessment to be complete while patient is alone) Abuse/Neglect  Assessment Can Be Completed: Yes Physical Abuse: Denies Verbal Abuse: Denies Sexual Abuse: Denies Exploitation of patient/patient's resources: Denies Self-Neglect: Denies     Regulatory affairs officer (For Healthcare) Does Patient Have a Medical Advance Directive?: No Would patient like information on creating a medical advance directive?: No - Patient declined          Disposition: TTS consulted with Lindon Romp, NP who recommends continued observation for safety and stabilization and to be reassessed in the AM by psych. EDP Quintella Reichert, MD and pt's nurse have been advised. Disposition Initial Assessment Completed for this Encounter: Yes Disposition of Patient: (overnight OBS pending AM psych assessment ) Patient refused recommended treatment: No  On Site Evaluation by:   Reviewed with Physician:    Lyanne Co 04/18/2018 10:49 PM

## 2018-04-18 NOTE — BH Assessment (Signed)
[facility] Assessment Progress Note  TTS consulted with Lindon Romp, NP who recommends continued observation for safety and stabilization and to be reassessed in the AM by psych. EDP Quintella Reichert, MD and pt's nurse have been advised.  Lind Covert, MSW, LCSW Therapeutic Triage Specialist  [phone]

## 2018-04-18 NOTE — ED Provider Notes (Signed)
[facility] DEPT Provider Note   CSN: [id number] Arrival date & time: 04/18/18  2054     History   Chief Complaint Chief Complaint  Patient presents with  . IVC'd  . Depression    HPI Nalany Steedley is a 52 y.o. female.  The history is provided by the patient. No language interpreter was used.   Sherley Mckenney is a 52 y.o. female who presents to the Emergency Department complaining of Psych eval, IVC. Presents to the emergency department by police for psychiatric evaluation and IVC. She states that she does not know why she is here. She states that she is currently living with her daughter and her father died one week ago. She denies any recent illnesses. She denies any medical problems. She states that she takes no medications. She denies any tobacco or drug use but does state that she drinks 2 to 3 glasses of wine occasionally. She states that she can stop whenever she wants. She denies any SI, HI. According to IVC papers the patient indicated she may kill herself in a text to her children earlier today. They state that she has been drinking on average one bottle of wine per day in operating a motor vehicle while under the influence. Paper states that she is verbally aggressive towards daughter telling her to kill herself and leave her alone. Past Medical History:  Diagnosis Date  . Cancer ([place])   . Closed fracture of left clavicle 05/05/2014  . Medical history non-contributory   . Melanoma in situ of left lower leg [facility])     Patient Active Problem List   Diagnosis Date Noted  . Depression with anxiety 07/20/2017  . Closed fracture of left clavicle, midshaft 05/05/2014    Past Surgical History:  Procedure Laterality Date  . BREAST SURGERY  1995   lt br bx-neg  . DILATION AND CURETTAGE OF UTERUS     x2post misscarage  . MELANOMA EXCISION  2016   left lower leg  . ORIF CLAVICULAR FRACTURE Left 05/05/2014   Procedure: OPEN REDUCTION INTERNAL  FIXATION (ORIF) LEFT  CLAVICULAR FRACTURE;  Surgeon: Johnny Bridge, MD;  Location: [place];  Service: Orthopedics;  Laterality: Left;  Marland Kitchen VEIN LIGATION AND STRIPPING     legs     OB History    Gravida      Para      Term      Preterm      AB      Living  3     SAB      TAB      Ectopic      Multiple      Live Births               Home Medications    Prior to Admission medications   Medication Sig Start Date End Date Taking? Authorizing Provider  BIOTIN PO Take 10,000 mg by mouth daily.   Yes [provider]  FLUoxetine (PROZAC) 40 MG capsule Take 1 capsule (40 mg total) by mouth daily. 02/26/18  Yes Laurey Morale, MD  loratadine (CLARITIN) 10 MG tablet Take 10 mg by mouth daily as needed for allergies.   Yes [provider]  LORazepam (ATIVAN) 1 MG tablet TAKE 1 TABLET (1 MG TOTAL) BY MOUTH 2 (TWO) TIMES DAILY AS NEEDED FOR ANXIETY. Patient taking differently: Take 1 mg by mouth daily.  03/06/18  Yes Laurey Morale, MD  Multiple Vitamin (MULTIVITAMIN)  tablet Take 1 tablet by mouth daily.   Yes [provider]  erythromycin ophthalmic ointment Place 1 application into the right eye 2 (two) times daily. Patient not taking: Reported on 04/18/2018 02/26/18   Laurey Morale, MD  LORazepam (ATIVAN) 1 MG tablet TAKE 1 TABLET (1 MG TOTAL) BY MOUTH 2 (TWO) TIMES DAILY AS NEEDED FOR ANXIETY. Patient not taking: Reported on 04/18/2018 01/04/18   Laurey Morale, MD    Family History No family history on file.  Social History Social History   Tobacco Use  . Smoking status: Former Smoker    Last attempt to quit: 04/30/1984    Years since quitting: 33.9  . Smokeless tobacco: Never Used  Substance Use Topics  . Alcohol use: Yes    Alcohol/week: 21.0 standard drinks    Types: 21 Glasses of wine per week    Comment: 3 glasses wine per day  . Drug use: No     Allergies   Codeine; Oxycodone; and Vicodin  [hydrocodone-acetaminophen]   Review of Systems Review of Systems  All other systems reviewed and are negative.    Physical Exam Updated Vital Signs BP (!) 160/103 (BP Location: Right Arm)   Pulse (!) 105   Temp 98.2 F (36.8 C) (Oral)   Resp 18   LMP 03/10/2018   SpO2 96%   Physical Exam  Constitutional: She is oriented to person, place, and time. She appears well-developed and well-nourished.  HENT:  Head: Normocephalic and atraumatic.  Cardiovascular: Regular rhythm.  No murmur heard. Tachycardic  Pulmonary/Chest: Effort normal and breath sounds normal. No respiratory distress.  Musculoskeletal: Normal range of motion.  Neurological: She is alert and oriented to person, place, and time.  Skin: Skin is warm and dry. Capillary refill takes less than 2 seconds.  Psychiatric:  Mildly agitated and anxious appearing, denies SI or HI.  Nursing note and vitals reviewed.    ED Treatments / Results  Labs (all labs ordered are listed, but only abnormal results are displayed) Labs Reviewed  COMPREHENSIVE METABOLIC PANEL - Abnormal; Notable for the following components:      Result Value   CO2 21 (*)    Glucose, Bld 119 (*)    Total Bilirubin <0.1 (*)    All other components within normal limits  ETHANOL - Abnormal; Notable for the following components:   Alcohol, Ethyl (B) 76 (*)    All other components within normal limits  RAPID URINE DRUG SCREEN, HOSP PERFORMED - Abnormal; Notable for the following components:   Benzodiazepines POSITIVE (*)    All other components within normal limits  CBC  I-STAT BETA HCG BLOOD, ED ([facility], [facility], [facility] ONLY)    EKG EKG Interpretation  Date/Time:  Thursday April 18 2018 22:00:45 EDT Ventricular Rate:  106 PR Interval:    QRS Duration: 82 QT Interval:  354 QTC Calculation: 471 R Axis:   65 Text Interpretation:  Sinus tachycardia Probable left atrial enlargement Confirmed by Quintella Reichert [phone]) on 04/18/2018 10:06:22  PM   Radiology No results found.  Procedures Procedures (including critical care time)  Medications Ordered in ED Medications  ibuprofen (ADVIL,MOTRIN) tablet 600 mg (has no administration in time range)  ondansetron (ZOFRAN) tablet 4 mg (has no administration in time range)  alum & mag hydroxide-simeth (MAALOX/MYLANTA) 200-200-20 MG/5ML suspension 30 mL (has no administration in time range)  LORazepam (ATIVAN) injection 0-4 mg (has no administration in time range)    Or  LORazepam (ATIVAN) tablet 0-4 mg (  has no administration in time range)  LORazepam (ATIVAN) injection 0-4 mg (has no administration in time range)    Or  LORazepam (ATIVAN) tablet 0-4 mg (has no administration in time range)  thiamine (VITAMIN B-1) tablet 100 mg (has no administration in time range)    Or  thiamine (B-1) injection 100 mg (has no administration in time range)     Initial Impression / Assessment and Plan / ED Course  I have reviewed the triage vital signs and the nursing notes.  Pertinent labs & imaging results that were available during my care of the patient were reviewed by me and considered in my medical decision making (see chart for details).     Patient here under IVC, per report texted that she wanted to kill herself. Patient is mildly agitated on ED arrival, denies SI.  She has been medically cleared for psychiatric evaluation and treatment.  Final Clinical Impressions(s) / ED Diagnoses   Final diagnoses:  None    ED Discharge Orders    None       Quintella Reichert, MD 04/18/18 2344

## 2018-04-18 NOTE — ED Triage Notes (Signed)
Per IVC paperwork: Pt has been threatening to kill herself via test messages. Pt has reportedly been verbally aggressive towards her daughter and has been drinking on average 1 bottle of wine per day and driving under the influence.

## 2018-04-18 NOTE — ED Notes (Signed)
EKG given to EDP,Schlossman,MD., for review. 

## 2018-04-19 ENCOUNTER — Other Ambulatory Visit: Payer: Self-pay

## 2018-04-19 ENCOUNTER — Inpatient Hospital Stay (HOSPITAL_COMMUNITY)
Admission: AD | Admit: 2018-04-19 | Discharge: 2018-04-23 | DRG: 885 | Disposition: A | Discharge status: Home / Self Care | Payer: Federal, State, Local not specified - Other | Attending: Psychiatry | Admitting: Psychiatry

## 2018-04-19 ENCOUNTER — Encounter (HOSPITAL_COMMUNITY): Payer: Self-pay | Admitting: Behavioral Health

## 2018-04-19 ENCOUNTER — Inpatient Hospital Stay (HOSPITAL_COMMUNITY): Admit: 2018-04-19 | Payer: Self-pay | Admitting: Psychiatry

## 2018-04-19 DIAGNOSIS — F1020 Alcohol dependence, uncomplicated: Secondary | ICD-10-CM | POA: Diagnosis present

## 2018-04-19 DIAGNOSIS — F418 Other specified anxiety disorders: Secondary | ICD-10-CM

## 2018-04-19 DIAGNOSIS — Z87891 Personal history of nicotine dependence: Secondary | ICD-10-CM

## 2018-04-19 DIAGNOSIS — I10 Essential (primary) hypertension: Secondary | ICD-10-CM | POA: Diagnosis present

## 2018-04-19 DIAGNOSIS — R45851 Suicidal ideations: Secondary | ICD-10-CM | POA: Diagnosis present

## 2018-04-19 DIAGNOSIS — Z79899 Other long term (current) drug therapy: Secondary | ICD-10-CM

## 2018-04-19 DIAGNOSIS — F1010 Alcohol abuse, uncomplicated: Secondary | ICD-10-CM | POA: Diagnosis present

## 2018-04-19 DIAGNOSIS — J302 Other seasonal allergic rhinitis: Secondary | ICD-10-CM | POA: Diagnosis present

## 2018-04-19 DIAGNOSIS — F419 Anxiety disorder, unspecified: Secondary | ICD-10-CM | POA: Diagnosis present

## 2018-04-19 DIAGNOSIS — Z7289 Other problems related to lifestyle: Secondary | ICD-10-CM

## 2018-04-19 DIAGNOSIS — G4700 Insomnia, unspecified: Secondary | ICD-10-CM | POA: Diagnosis present

## 2018-04-19 DIAGNOSIS — R451 Restlessness and agitation: Secondary | ICD-10-CM

## 2018-04-19 DIAGNOSIS — R251 Tremor, unspecified: Secondary | ICD-10-CM | POA: Diagnosis present

## 2018-04-19 DIAGNOSIS — Z885 Allergy status to narcotic agent status: Secondary | ICD-10-CM

## 2018-04-19 DIAGNOSIS — F322 Major depressive disorder, single episode, severe without psychotic features: Secondary | ICD-10-CM | POA: Diagnosis not present

## 2018-04-19 DIAGNOSIS — J301 Allergic rhinitis due to pollen: Secondary | ICD-10-CM | POA: Diagnosis present

## 2018-04-19 DIAGNOSIS — R413 Other amnesia: Secondary | ICD-10-CM

## 2018-04-19 MED ORDER — VITAMIN B-1 100 MG PO TABS
100.0000 mg | ORAL_TABLET | Freq: Every day | ORAL | Status: DC
  Administered 2018-04-20 – 2018-04-23 (×4): 100 mg via ORAL
  Filled 2018-04-19 (×6): qty 1

## 2018-04-19 MED ORDER — ADULT MULTIVITAMIN W/MINERALS CH
1.0000 | ORAL_TABLET | Freq: Every day | ORAL | Status: DC
  Administered 2018-04-19 – 2018-04-23 (×5): 1 via ORAL
  Filled 2018-04-19 (×8): qty 1

## 2018-04-19 MED ORDER — CHLORDIAZEPOXIDE HCL 25 MG PO CAPS
25.0000 mg | ORAL_CAPSULE | Freq: Four times a day (QID) | ORAL | Status: AC | PRN
  Administered 2018-04-19 – 2018-04-21 (×4): 25 mg via ORAL
  Filled 2018-04-19 (×4): qty 1

## 2018-04-19 MED ORDER — HYDROXYZINE HCL 25 MG PO TABS
25.0000 mg | ORAL_TABLET | Freq: Four times a day (QID) | ORAL | Status: DC | PRN
  Administered 2018-04-19: 25 mg via ORAL
  Filled 2018-04-19: qty 1

## 2018-04-19 MED ORDER — FLUOXETINE HCL 20 MG PO CAPS
40.0000 mg | ORAL_CAPSULE | Freq: Every day | ORAL | Status: DC
  Administered 2018-04-19: 40 mg via ORAL
  Filled 2018-04-19: qty 2

## 2018-04-19 MED ORDER — ONDANSETRON 4 MG PO TBDP: 4.0000 mg | ORAL_TABLET | Freq: Four times a day (QID) | ORAL | Status: AC | PRN

## 2018-04-19 MED ORDER — TRAZODONE HCL 50 MG PO TABS
50.0000 mg | ORAL_TABLET | Freq: Every evening | ORAL | Status: DC | PRN
  Administered 2018-04-19: 50 mg via ORAL
  Filled 2018-04-19: qty 1

## 2018-04-19 MED ORDER — HYDROCHLOROTHIAZIDE 12.5 MG PO CAPS
25.0000 mg | ORAL_CAPSULE | Freq: Every day | ORAL | Status: DC
  Administered 2018-04-19: 25 mg via ORAL
  Filled 2018-04-19: qty 2

## 2018-04-19 MED ORDER — HYDROCHLOROTHIAZIDE 25 MG PO TABS
25.0000 mg | ORAL_TABLET | Freq: Every day | ORAL | Status: DC
  Administered 2018-04-20: 25 mg via ORAL
  Filled 2018-04-19 (×3): qty 1

## 2018-04-19 MED ORDER — ALUM & MAG HYDROXIDE-SIMETH 200-200-20 MG/5ML PO SUSP: 30.0000 mL | ORAL | Status: DC | PRN

## 2018-04-19 MED ORDER — LOPERAMIDE HCL 2 MG PO CAPS: 2.0000 mg | ORAL_CAPSULE | ORAL | Status: AC | PRN

## 2018-04-19 MED ORDER — THIAMINE HCL 100 MG/ML IJ SOLN: 100.0000 mg | Freq: Once | INTRAMUSCULAR | Status: DC

## 2018-04-19 MED ORDER — FLUOXETINE HCL 40 MG PO CAPS: 40.0000 mg | ORAL_CAPSULE | Freq: Every day | ORAL | Status: DC

## 2018-04-19 MED ORDER — MAGNESIUM HYDROXIDE 400 MG/5ML PO SUSP: 30.0000 mL | Freq: Every day | ORAL | Status: DC | PRN

## 2018-04-19 MED ORDER — FLUOXETINE HCL 20 MG PO CAPS
40.0000 mg | ORAL_CAPSULE | Freq: Every day | ORAL | Status: DC
  Administered 2018-04-20 – 2018-04-23 (×4): 40 mg via ORAL
  Filled 2018-04-19 (×4): qty 2
  Filled 2018-04-19: qty 14
  Filled 2018-04-19: qty 1
  Filled 2018-04-19: qty 2

## 2018-04-19 NOTE — ED Notes (Signed)
Visitor at bedside.

## 2018-04-19 NOTE — Progress Notes (Signed)
Pt admitted to the adult unit with c/o increased alcohol consumption, increased stress and anxiety. Pt reported drinking 3-4 glasses of wine daily. Pt denies having a previous substance abuse hx. Pt reported stressors are: her father passed away 2 weeks ago, son was recently hospitalized and dx with crohn's dx and her mother and grandmother both passed away within the last two years. Pt denies any suicidal ideations. Pt denies a hx of depression and only reported having a hx anxiety. Pt hypertensive on admission. Pt denies a hx of HTN.  Report given to Legrand Como, RN. Orders released.

## 2018-04-19 NOTE — Tx Team (Signed)
Initial Treatment Plan 04/19/2018 5:51 PM Kim Elliott [id number]    PATIENT STRESSORS: Loss of father Substance abuse   PATIENT STRENGTHS: Ability for insight Motivation for treatment/growth   PATIENT IDENTIFIED PROBLEMS: "father passed away 2 weeks ago"  "son was recently dx with crohns dx"  "heavy alcohol use"                 DISCHARGE CRITERIA:  Ability to meet basic life and health needs Adequate post-discharge living arrangements Improved stabilization in mood, thinking, and/or behavior  PRELIMINARY DISCHARGE PLAN: Attend aftercare/continuing care group Attend PHP/IOP  PATIENT/FAMILY INVOLVEMENT: This treatment plan has been presented to and reviewed with the patient, Kim Elliott, and/or family member.  The patient and family have been given the opportunity to ask questions and make suggestions.  Marissa Calamity, RN 04/19/2018, 5:51 PM

## 2018-04-19 NOTE — ED Notes (Addendum)
[employer] on unit to transfer pt to [facility] Adult unit per MD order. Personal property given to [employer] for transfer. Ambulatory off unit in police custody.

## 2018-04-19 NOTE — ED Notes (Signed)
Pt complaint with medication regimen, appears to be minimizing, guarded. Denies SI/HI/AVH. Safety sitter present.

## 2018-04-19 NOTE — Progress Notes (Signed)
Pt attended AA group this evening.  

## 2018-04-19 NOTE — Progress Notes (Signed)
D   Pt is pleasant on approach and cooperative    She requested medication for sleep   She interacts well and appropriately with others A    Verbal support given   Medications administered and effectiveness monitored    Q 15 min checks R   Pt is presently safe

## 2018-04-19 NOTE — Consult Note (Addendum)
[facility] Consult   Reason for Consult:  Reported SI Referring Physician:  Dr. Ralene Bathe Patient Identification: Kim Elliott MRN:  [id number] Principal Diagnosis: Depression with anxiety Diagnosis:   Patient Active Problem List   Diagnosis Date Noted  . Depression with anxiety [F41.8] 07/20/2017  . Closed fracture of left clavicle, midshaft [S42.002A] 05/05/2014    Total Time spent with patient: 30 minutes  Subjective:   Kim Elliott is a 52 y.o. female patient reports that she does dink about 3 glasses of wine a day. She reports taking ativan as well. She feels she does not have a problem with alcohol. She also cannot remember what she said or what happened completely yesterday. She denies sending any text messages. She denies currently any SI/HI/AVH at this time.   HPI:   On evaluation today she is alert and oriented x4 but unable to verbalize why she is at the hospital.  She endorsed increased stress in the context of recent death of her father (2 weeks prior) and her son being diagnosed with crohn's disease (one week prior) but cannot recall the sequence of events leading to her current hospital visit.   She endorses drinking 3 glasses of wine daily but does not feel this causes her impairment.  Reports having 3 drinks yesterday but cannot recall sending her children a text containing suicidial ideations or intent.    She denies psychiatric diagnosis or inpatient hospitalization.  She denies suicidal or homicidal ideations to this Probation officer.  After discussing collateral information received from IVC of patient making SI to her children, patient denied memory of previous events.    We discussed inpatient admission for safety concerns and treatment for alcohol use disorder.  Patient demonstrated good insight into her stressors and endorsed the need to seek treatment for her alcohol use.       Past Psychiatric History: Depression, Anxiety, alcohol abuse  Risk to Self:  Suicidal Ideation: Yes-Currently Present(per IVC) Suicidal Intent: No Is patient at risk for suicide?: Yes Suicidal Plan?: No Access to Means: No What has been your use of drugs/alcohol within the last 12 months?: wine Triggers for Past Attempts: None known Intentional Self Injurious Behavior: None Risk to Others: Homicidal Ideation: No Thoughts of Harm to Others: No Current Homicidal Intent: No Current Homicidal Plan: No Access to Homicidal Means: No History of harm to others?: No Assessment of Violence: None Noted Does patient have access to weapons?: No Criminal Charges Pending?: No Does patient have a court date: No Prior Inpatient Therapy: Prior Inpatient Therapy: No Prior Outpatient Therapy: Prior Outpatient Therapy: Yes Prior Therapy Dates: 2019 Prior Therapy Facilty/Provider(s): Therapist, [facility] at [facility] Reason for Treatment: Depression, Anxiety  Does patient have an ACCT team?: No Does patient have Intensive In-House Services?  : No Does patient have [facility] services? : No Does patient have P4CC services?: No  Past Medical History:  Past Medical History:  Diagnosis Date  . Cancer ([place])   . Closed fracture of left clavicle 05/05/2014  . Medical history non-contributory   . Melanoma in situ of left lower leg [facility])     Past Surgical History:  Procedure Laterality Date  . BREAST SURGERY  1995   lt br bx-neg  . DILATION AND CURETTAGE OF UTERUS     x2post misscarage  . MELANOMA EXCISION  2016   left lower leg  . ORIF CLAVICULAR FRACTURE Left 05/05/2014   Procedure: OPEN REDUCTION INTERNAL FIXATION (ORIF) LEFT  CLAVICULAR FRACTURE;  Surgeon: Johnny Bridge,  MD;  Location: [facility];  Service: Orthopedics;  Laterality: Left;  Marland Kitchen VEIN LIGATION AND STRIPPING     legs   Family History: No family history on file. Family Psychiatric  History: Denies Social History:  Social History   Substance and Sexual Activity  Alcohol Use Yes  .  Alcohol/week: 21.0 standard drinks  . Types: 21 Glasses of wine per week   Comment: 3 glasses wine per day     Social History   Substance and Sexual Activity  Drug Use No    Social History   Socioeconomic History  . Marital status: Divorced    Spouse name: Not on file  . Number of children: Not on file  . Years of education: Not on file  . Highest education level: Not on file  Occupational History  . Not on file  Social Needs  . Financial resource strain: Not on file  . Food insecurity:    Worry: Not on file    Inability: Not on file  . Transportation needs:    Medical: Not on file    Non-medical: Not on file  Tobacco Use  . Smoking status: Former Smoker    Last attempt to quit: 04/30/1984    Years since quitting: 33.9  . Smokeless tobacco: Never Used  Substance and Sexual Activity  . Alcohol use: Yes    Alcohol/week: 21.0 standard drinks    Types: 21 Glasses of wine per week    Comment: 3 glasses wine per day  . Drug use: No  . Sexual activity: Yes  Lifestyle  . Physical activity:    Days per week: Not on file    Minutes per session: Not on file  . Stress: Not on file  Relationships  . Social connections:    Talks on phone: Not on file    Gets together: Not on file    Attends religious service: Not on file    Active member of club or organization: Not on file    Attends meetings of clubs or organizations: Not on file    Relationship status: Not on file  Other Topics Concern  . Not on file  Social History Narrative  . Not on file   Additional Social History:    Allergies:   Allergies  Allergen Reactions  . Codeine Nausea And Vomiting  . Oxycodone     hallucinations  . Vicodin [Hydrocodone-Acetaminophen]     hallucinations    Labs:  Results for orders placed or performed during the hospital encounter of 04/18/18 (from the past 48 hour(s))  Comprehensive metabolic panel     Status: Abnormal   Collection Time: 04/18/18  9:01 PM  Result Value Ref  Range   Sodium 140 135 - 145 mmol/L   Potassium 3.9 3.5 - 5.1 mmol/L   Chloride 107 98 - 111 mmol/L   CO2 21 (L) 22 - 32 mmol/L   Glucose, Bld 119 (H) 70 - 99 mg/dL   BUN 14 6 - 20 mg/dL   Creatinine, Ser 0.61 0.44 - 1.00 mg/dL   Calcium 9.4 8.9 - 10.3 mg/dL   Total Protein 7.7 6.5 - 8.1 g/dL   Albumin 4.0 3.5 - 5.0 g/dL   AST 24 15 - 41 U/L   ALT 21 0 - 44 U/L   Alkaline Phosphatase 65 38 - 126 U/L   Total Bilirubin <0.1 (L) 0.3 - 1.2 mg/dL   GFR calc non Af Amer >60 >60 mL/min   GFR calc  Af Amer >60 >60 mL/min    Comment: (NOTE) The eGFR has been calculated using the CKD EPI equation. This calculation has not been validated in all clinical situations. eGFR's persistently <60 mL/min signify possible Chronic Kidney Disease.    Anion gap 12 5 - 15    Comment: Performed at [facility], [place] [address], [place], [place] [address]  Ethanol     Status: Abnormal   Collection Time: 04/18/18  9:01 PM  Result Value Ref Range   Alcohol, Ethyl (B) 76 (H) <10 mg/dL    Comment: (NOTE) Lowest detectable limit for serum alcohol is 10 mg/dL. For medical purposes only. Performed at [facility], [place] [address] [place], [place] [address]   cbc     Status: None   Collection Time: 04/18/18  9:01 PM  Result Value Ref Range   WBC 5.0 4.0 - 10.5 K/uL   RBC 4.29 3.87 - 5.11 MIL/uL   Hemoglobin 12.4 12.0 - 15.0 g/dL   HCT 38.7 36.0 - 46.0 %   MCV 90.2 80.0 - 100.0 fL   MCH 28.9 26.0 - 34.0 pg   MCHC 32.0 30.0 - 36.0 g/dL   RDW 15.1 11.5 - 15.5 %   Platelets 274 150 - 400 K/uL   nRBC 0.0 0.0 - 0.2 %    Comment: Performed at [facility], [place] [address], [place], [place] [address]  Rapid urine drug screen (hospital performed)     Status: Abnormal   Collection Time: 04/18/18  9:01 PM  Result Value Ref Range   Opiates NONE DETECTED NONE DETECTED   Cocaine NONE DETECTED NONE DETECTED   Benzodiazepines POSITIVE (A) NONE DETECTED    Amphetamines NONE DETECTED NONE DETECTED   Tetrahydrocannabinol NONE DETECTED NONE DETECTED   Barbiturates NONE DETECTED NONE DETECTED    Comment: (NOTE) DRUG SCREEN FOR MEDICAL PURPOSES ONLY.  IF CONFIRMATION IS NEEDED FOR ANY PURPOSE, NOTIFY LAB WITHIN 5 DAYS. LOWEST DETECTABLE LIMITS FOR URINE DRUG SCREEN Drug Class                     Cutoff (ng/mL) Amphetamine and metabolites    1000 Barbiturate and metabolites    200 Benzodiazepine                 008 Tricyclics and metabolites     300 Opiates and metabolites        300 Cocaine and metabolites        300 THC                            50 Performed at [facility], [place] [address] [place], [place] [address]   I-Stat beta hCG blood, ED     Status: None   Collection Time: 04/18/18  9:25 PM  Result Value Ref Range   I-stat hCG, quantitative <5.0 <5 mIU/mL   Comment 3            Comment:   GEST. AGE      CONC.  (mIU/mL)   <=1 WEEK        5 - 50     2 WEEKS       50 - 500     3 WEEKS       100 - 10,000     4 WEEKS     1,000 - 30,000        FEMALE  AND NON-PREGNANT FEMALE:     LESS THAN 5 mIU/mL     Current Facility-Administered Medications  Medication Dose Route Frequency Provider Last Rate Last Dose  . alum & mag hydroxide-simeth (MAALOX/MYLANTA) 200-200-20 MG/5ML suspension 30 mL  30 mL Oral Q6H PRN Quintella Reichert, MD      . FLUoxetine (PROZAC) capsule 40 mg  40 mg Oral Daily Money, Lowry Ram, FNP   40 mg at 04/19/18 1234  . ibuprofen (ADVIL,MOTRIN) tablet 600 mg  600 mg Oral Q8H PRN Quintella Reichert, MD   600 mg at 04/19/18 0307  . LORazepam (ATIVAN) injection 0-4 mg  0-4 mg Intravenous Q6H Quintella Reichert, MD       Or  . LORazepam (ATIVAN) tablet 0-4 mg  0-4 mg Oral Q6H Quintella Reichert, MD      . Derrill Memo ON 04/21/2018] LORazepam (ATIVAN) injection 0-4 mg  0-4 mg Intravenous Q12H Quintella Reichert, MD       Or  . Derrill Memo ON 04/21/2018] LORazepam (ATIVAN) tablet 0-4 mg  0-4 mg Oral Q12H Quintella Reichert, MD       . ondansetron [facility]) tablet 4 mg  4 mg Oral Q8H PRN Quintella Reichert, MD      . thiamine (VITAMIN B-1) tablet 100 mg  100 mg Oral Daily Quintella Reichert, MD       Or  . thiamine (B-1) injection 100 mg  100 mg Intravenous Daily Quintella Reichert, MD       Current Outpatient Medications  Medication Sig Dispense Refill  . BIOTIN PO Take 10,000 mg by mouth daily.    Marland Kitchen FLUoxetine (PROZAC) 40 MG capsule Take 1 capsule (40 mg total) by mouth daily. 90 capsule 3  . loratadine (CLARITIN) 10 MG tablet Take 10 mg by mouth daily as needed for allergies.    Marland Kitchen LORazepam (ATIVAN) 1 MG tablet TAKE 1 TABLET (1 MG TOTAL) BY MOUTH 2 (TWO) TIMES DAILY AS NEEDED FOR ANXIETY. (Patient taking differently: Take 1 mg by mouth daily. ) 180 tablet 1  . Multiple Vitamin (MULTIVITAMIN) tablet Take 1 tablet by mouth daily.    Marland Kitchen erythromycin ophthalmic ointment Place 1 application into the right eye 2 (two) times daily. (Patient not taking: Reported on 04/18/2018) 1 g 0  . LORazepam (ATIVAN) 1 MG tablet TAKE 1 TABLET (1 MG TOTAL) BY MOUTH 2 (TWO) TIMES DAILY AS NEEDED FOR ANXIETY. (Patient not taking: Reported on 04/18/2018) 180 tablet 1    Musculoskeletal: Strength & Muscle Tone: within normal limits Gait & Station: normal Patient leans: N/A  Psychiatric Specialty Exam: Physical Exam  Constitutional: She appears well-developed.  HENT:  Head: Normocephalic.  Neck: Normal range of motion.  Respiratory: Effort normal.  Musculoskeletal: Normal range of motion.  Psychiatric: Her speech is normal. Judgment and thought content normal. Her mood appears anxious. She is agitated. Cognition and memory are impaired.    Review of Systems  Constitutional: Negative.   HENT: Negative.   Eyes: Negative.   Respiratory: Negative.   Cardiovascular: Negative.   Gastrointestinal: Negative.   Genitourinary: Negative.   Musculoskeletal: Negative.   Skin: Negative.   Neurological: Negative.   Endo/Heme/Allergies: Negative.    Psychiatric/Behavioral: The patient is nervous/anxious.     Blood pressure (!) 166/103, pulse 90, temperature 98.1 F (36.7 C), temperature source Oral, resp. rate 16, last menstrual period 03/10/2018, SpO2 98 %.There is no height or weight on file to calculate BMI.  General Appearance: Casual  Eye Contact:  Good  Speech:  Clear and Coherent  Volume:  Normal  Mood:  Anxious  Affect:  Congruent  Thought Process:  Goal Directed  Orientation:  Full (Time, Place, and Person)  Thought Content:  Logical  Suicidal Thoughts:  No Denies currently but was IVC'd due to the SI  Homicidal Thoughts:  No  Memory:  Immediate;   Good  Judgement:  Fair  Insight:  Good  Psychomotor Activity:  Normal  Concentration:  Concentration: Good and Attention Span: Good  Recall:  [place] of Knowledge:  Fair  Language:  Good  Akathisia:  NA  Handed:  Right  AIMS (if indicated):     Assets:  Communication Skills Desire for Improvement Resilience Social Support  ADL's:  Intact  Cognition:  Impaired,  Mild  Sleep:        Treatment Plan Summary: Daily contact with patient to assess and evaluate symptoms and progress in treatment  Disposition: Recommend psychiatric Inpatient admission when medically cleared.  Lewis Shock, FNP 04/19/2018 12:56 PM   Patient seen face-to-face for this psychiatric evaluation by this MD along with the staff RN, physician extended, discussed case with the treatment team and formulated treatment plan/disposition plan including medication management, note has been reviewed and I personally elaborated treatment  plan and recommendations.  Ambrose Finland, MD 04/19/2018

## 2018-04-19 NOTE — ED Notes (Signed)
Notified EDP Campos of pt VS.

## 2018-04-19 NOTE — BH Assessment (Signed)
[place] Assessment Progress Note  Per Ambrose Finland, MD, this pt requires psychiatric hospitalization.  Letitia Libra, RN, [facility] has assigned pt to [facility] Rm [id number]; [facility] will be ready to receive pt at 15:30.  Pt presents under IVC initiated by pt's daughter, and upheld by EDP Quintella Reichert, MD, and IVC documents have been faxed to [facility].  Pt's nurse, Caryl Pina, has been notified, and agrees to call report to [phone].  Pt is to be transported via Event organiser.   Jalene Mullet, [place] Coordinator [phone]

## 2018-04-20 DIAGNOSIS — F322 Major depressive disorder, single episode, severe without psychotic features: Principal | ICD-10-CM

## 2018-04-20 LAB — TSH: TSH: 6.123 u[IU]/mL — ABNORMAL HIGH (ref 0.350–4.500)

## 2018-04-20 MED ORDER — LISINOPRIL 10 MG PO TABS
10.0000 mg | ORAL_TABLET | Freq: Every day | ORAL | Status: DC
Start: 2018-04-20 — End: 2018-04-23
  Administered 2018-04-20 – 2018-04-23 (×4): 10 mg via ORAL
  Filled 2018-04-20 (×2): qty 1
  Filled 2018-04-20: qty 7
  Filled 2018-04-20 (×3): qty 1

## 2018-04-20 MED ORDER — GABAPENTIN 100 MG PO CAPS
100.0000 mg | ORAL_CAPSULE | Freq: Three times a day (TID) | ORAL | Status: DC
  Administered 2018-04-20 (×2): 100 mg via ORAL
  Filled 2018-04-20 (×8): qty 1

## 2018-04-20 MED ORDER — HYDROXYZINE HCL 50 MG PO TABS
50.0000 mg | ORAL_TABLET | Freq: Four times a day (QID) | ORAL | Status: AC | PRN
  Administered 2018-04-20 – 2018-04-22 (×6): 50 mg via ORAL
  Filled 2018-04-20 (×6): qty 1

## 2018-04-20 MED ORDER — TRAZODONE HCL 100 MG PO TABS: 100.0000 mg | ORAL_TABLET | Freq: Every evening | ORAL | Status: DC | PRN

## 2018-04-20 MED ORDER — TRAZODONE HCL 50 MG PO TABS
50.0000 mg | ORAL_TABLET | Freq: Every evening | ORAL | Status: DC | PRN
  Administered 2018-04-20 – 2018-04-22 (×3): 50 mg via ORAL
  Filled 2018-04-20: qty 1
  Filled 2018-04-20: qty 7
  Filled 2018-04-20 (×2): qty 1

## 2018-04-20 NOTE — BHH Suicide Risk Assessment (Addendum)
[facility] Admission Suicide Risk Assessment   Nursing information obtained from:  Patient Demographic factors:  Unemployed, Low socioeconomic status, Caucasian Current Mental Status:  NA Loss Factors:  Loss of significant relationship Historical Factors:  Impulsivity, Family history of mental illness or substance abuse Risk Reduction Factors:  Sense of responsibility to family, Living with another person, especially a relative  Total Time spent with patient: 45 minutes Principal Problem: MDD (major depressive disorder), severe ([place]) Diagnosis:   Patient Active Problem List   Diagnosis Date Noted  . MDD (major depressive disorder), severe ([place]) [F32.2] 04/19/2018  . Depression with anxiety [F41.8] 07/20/2017  . Closed fracture of left clavicle, midshaft [S42.002A] 05/05/2014   Subjective Data:   Continued Clinical Symptoms:  Alcohol Use Disorder Identification Test Final Score (AUDIT): 11 The "Alcohol Use Disorders Identification Test", Guidelines for Use in Primary Care, Second Edition.  World Pharmacologist [facility]). Score between 0-7:  no or low risk or alcohol related problems. Score between 8-15:  moderate risk of alcohol related problems. Score between 16-19:  high risk of alcohol related problems. Score 20 or above:  warrants further diagnostic evaluation for alcohol dependence and treatment.   CLINICAL FACTORS:  52 year old single female, has three adult children, lives with adult daughter,employed as Pharmacist, hospital. Was admitted to hospital under IVC. Patient states " the cops showed up at my house on Thursday night, and brought me to the hospital". As per IVC , patient had sent text message to her children indicating she might kill herself, had been angry and aggressive towards daughter, and had been drinking a bottle of wine per day, driving under the influence. Patient reports she does not remember behaving as above, but does  report she did recently blackout so does not remember  sending above messages . She states she has not had suicidal ideations, but has been sad related to recent stressors, losses . She states she does feel alcohol " has gotten out of control lately".  She does report some neuro-vegetative symptoms to include feeling of sadness, anhedonia, poor sleep. Denies changes in appetite or energy level . Reports facing increased stressors recently, had been taking care of her father, who passed away from Multiple Myeloma 2 weeks ago,  one of her son's was recently diagnosed with Chron's Disease. Reports history of alcohol consumption: 2-3 glasses of wine daily. Admission BAL 76. UDS positive for BZD Reports she has been on Prozac for 10 + years, recently increased to 40 mgrs QDAY by her outpatient MD, and on Ativan, which she had been taking at 1-2 mgr per day.  Medical history - reports history of melanoma on L Leg, excised 3 years ago.  Dx- Suicidal Ideations, consider MDD versus Alcohol Induced Mood Disorder. Consider Alcohol Use Disorder   Plan- Inpatient admission.  Continue Prozac 40 mgrs QDAY  Was Started on Lisinopril 10 mgrs QDAY for HTN Librium  PRNs  per CIWA scores if needed for alcohol withdrawal .  Check FT3, FT4 to follow up on mildly elevated TSH ( 6.12)     Musculoskeletal: Strength & Muscle Tone: within normal limits- no distal tremors, no psychomotor agitation, no restlessness  Gait & Station: normal Patient leans: N/A  Psychiatric Specialty Exam: Physical Exam  ROS no headache, no visual disturbances, no chest pain, no dyspnea , no nausea, no vomiting   Blood pressure (!) 149/103, pulse 89, temperature 98.5 F (36.9 C), temperature source Oral, resp. rate 17, height '5\' 5"'$  (1.651 m), weight 72.6 kg.Body  mass index is 26.63 kg/m. repeat BP 99/70  General Appearance: Neat  Eye Contact:  Good  Speech:  Normal Rate  Volume:  Normal  Mood:  depressed, vaguely anxious   Affect:  anxious, reactive, smiles at times appropriately    Thought Process:  Linear and Descriptions of Associations: Intact  Orientation:  Full (Time, Place, and Person)  Thought Content:  no hallucinations, no delusions, not internally preoccupied   Suicidal Thoughts:  No  Denies suicidal or self injurious ideations, denies homicidal or violent ideations  Homicidal Thoughts:  No  Memory:  recent and remote grossly intact   Judgement:  Fair  Insight:  Fair  Psychomotor Activity:  Normal  Concentration:  Concentration: Good and Attention Span: Good  Recall:  Good  Fund of Knowledge:  Good  Language:  Good  Akathisia:  Negative  Handed:  Right  AIMS (if indicated):     Assets:  Communication Skills Desire for Improvement Resilience  ADL's:  Intact  Cognition:  WNL  Sleep:  Number of Hours: 6.5      COGNITIVE FEATURES THAT CONTRIBUTE TO RISK:  Closed-mindedness and Loss of executive function    SUICIDE RISK:   Moderate:  Frequent suicidal ideation with limited intensity, and duration, some specificity in terms of plans, no associated intent, good self-control, limited dysphoria/symptomatology, some risk factors present, and identifiable protective factors, including available and accessible social support.  PLAN OF CARE: Patient will be admitted to inpatient psychiatric unit for stabilization and safety. Will provide and encourage milieu participation. Provide medication management and maked adjustments as needed.  Will also provide medication management for alcohol WDL as needed .Will follow daily.    I certify that inpatient services furnished can reasonably be expected to improve the patient's condition.   Jenne Campus, MD 04/20/2018, 1:49 PM
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
## 2018-04-20 NOTE — Progress Notes (Signed)
Pt attended AA group this evening.  

## 2018-04-20 NOTE — BHH Group Notes (Signed)
[facility] Group Notes:  (Nursing)  Date:  04/20/2018  Time: 1:15 PM Type of Therapy:  Nurse Education  Participation Level:  Active  Participation Quality:  Appropriate and Attentive  Affect:  Appropriate  Cognitive:  Appropriate  Insight:  Appropriate  Engagement in Group:  Engaged  Modes of Intervention:  Discussion and Education  Summary of Progress/Problems: Nurse led group on life skills  Waymond Cera 04/20/2018, 6:59 PM

## 2018-04-20 NOTE — BHH Group Notes (Signed)
LCSW Group Therapy Note  04/20/2018     10:00-11:00AM  Type of Therapy and Topic:  Group Therapy:  Decisional Balance/Substance Use  Participation Level:  Active        . Description of Group:  The main focus of today's process group was learning how to use a decisional balance exercise to make a decision about whether to change an unhealthy coping skill, as well as how to use the information gathered in the actual process of planning that change.  Patients listed some of their most frequently utilized unhealthy coping techniques and CSW pointed out the similarities.  Motivational Interviewing and the whiteboard were utilized to help patients explore in-depth the perceived benefits and costs of a specific, shared unhealthy coping technique (drinking & drugging) as well as the benefits and costs of replacing that with other, healthy coping skills.  A handout was distributed for patients to be able to do this exercise for themselves.     Therapeutic Goals 1. Patient will be able to utilize the decision balance exercise on their own 2. Patient will list coping skills they use to fulfill their needs 3. Patient will identify the differences between healthy and  unhealthy coping skills 4. Patient will verbalize the costs and benefits of drinking/drugging versus making the choice to change 5. Patient will learn how to use the exercise to identify the most important supports to put in place so that they can succeed in a change to which they commit  Summary of Patient Progress: During group, patient expressed that drinking is the unhealthy coping skill she uses and she believes she has to change it.  She participated well throughout group.   Therapeutic Modalities Cognitive Behavioral Therapy Motivational Interviewing   Maretta Los, LCSW

## 2018-04-20 NOTE — BHH Suicide Risk Assessment (Signed)
[facility] INPATIENT:  Family/Significant Other Suicide Prevention Education  Suicide Prevention Education:  Education Completed; son [name] [phone] has been identified by the patient as the family member/significant other with whom the patient will be residing, and identified as the person(s) who will aid the patient in the event of a mental health crisis (suicidal ideations/suicide attempt).  With written consent from the patient, the family member/significant other has been provided the following suicide prevention education, prior to the and/or following the discharge of the patient.  The suicide prevention education provided includes the following:  Suicide risk factors  Suicide prevention and interventions  National Suicide Hotline telephone number  [facility] assessment telephone number  [facility] Emergency Assistance [place] and/or Residential Mobile Crisis Unit telephone number  Request made of family/significant other to:  Remove weapons (e.g., guns, rifles, knives), all items previously/currently identified as safety concern.    Remove drugs/medications (over-the-counter, prescriptions, illicit drugs), all items previously/currently identified as a safety concern.  The family member/significant other verbalizes understanding of the suicide prevention education information provided.  The family member/significant other agrees to remove the items of safety concern listed above.  Patient's son had no concerns to share.  He stated there are no weapons in the home to his knowledge.  He agreed to share this information with his sisters.  Berlin Hun Grossman-Orr 04/20/2018, 4:06 PM

## 2018-04-20 NOTE — Plan of Care (Signed)
D: Patient presents depressed with flat affect. She reports fair to poor sleep, but did receive medication to help. Her appetite is poor, energy hyper, and concentration poor. She rates her depression and feeling of hopelessness 0/10, and anxiety 10/10. Her BP is elevated at 180/110, and provider T. Money, NP notified. Lisinopril ordered, Librium PRN administered per protocol and with hope of BP management. She reports diffuse joint aches 4/10, but declines intervention. Patient denies SI/HI/AVH.  A: Patient checked q15 min, and checks reviewed. Reviewed medication changes with patient and educated on side effects. Educated patient on importance of attending group therapy sessions and educated on several coping skills. Encouarged participation in milieu through recreation therapy and attending meals with peers. Support and encouragement provided. Fluids offered. R: Patient receptive to education on medications, and is medication compliant. Patient contracts for safety on the unit. Goal: "Getting released to go back to my normal life" and "except that I cannot drink anymore."

## 2018-04-21 DIAGNOSIS — I10 Essential (primary) hypertension: Secondary | ICD-10-CM

## 2018-04-21 DIAGNOSIS — G4700 Insomnia, unspecified: Secondary | ICD-10-CM

## 2018-04-21 DIAGNOSIS — F419 Anxiety disorder, unspecified: Secondary | ICD-10-CM

## 2018-04-21 DIAGNOSIS — F1020 Alcohol dependence, uncomplicated: Secondary | ICD-10-CM | POA: Diagnosis present

## 2018-04-21 DIAGNOSIS — J302 Other seasonal allergic rhinitis: Secondary | ICD-10-CM | POA: Diagnosis present

## 2018-04-21 LAB — T4, FREE: Free T4: 0.71 ng/dL — ABNORMAL LOW (ref 0.82–1.77)

## 2018-04-21 MED ORDER — LORATADINE 10 MG PO TABS
10.0000 mg | ORAL_TABLET | Freq: Every day | ORAL | Status: DC
  Administered 2018-04-21 – 2018-04-23 (×3): 10 mg via ORAL
  Filled 2018-04-21 (×4): qty 1
  Filled 2018-04-21: qty 7
  Filled 2018-04-21: qty 1

## 2018-04-21 NOTE — Progress Notes (Signed)
Patient did attend the evening speaker AA meeting.  

## 2018-04-21 NOTE — BHH Group Notes (Signed)
[facility] Group Notes:  (Nursing)  Date:  04/21/2018  Time: 1:15 PM Type of Therapy:  Nurse Education  Participation Level:  Active  Participation Quality:  Appropriate  Affect:  Appropriate  Cognitive:  Appropriate  Insight:  Appropriate  Engagement in Group:  Engaged  Modes of Intervention:  Discussion and Education  Summary of Progress/Problems:  Nurse led group: [place] 04/21/2018, 4:57 PM

## 2018-04-21 NOTE — Plan of Care (Signed)
D: Patient presents calm and cooperative on assessment. She is looking forward to discharging. She slept well last night, and received medication that was helpful. Her appetite is good, energy normal and concentration good. She rates her depression and hopelessness 0/10. Her anxiety is 5/10. She denies physical symptoms or withdrawal complaints. She did have an acute exacerbation of anxiety after she witnessed another patient have a seizure today at lunch. Patient denies SI/HI/AVH.  A: Administered vistaril for anxiety. Patient checked q15 min, and checks reviewed. Reviewed medication changes with patient and educated on side effects. Educated patient on importance of attending group therapy sessions and educated on several coping skills. Encouarged participation in milieu through recreation therapy and attending meals with peers. Support and encouragement provided. Fluids offered. R: Patient receptive to education on medications, and is medication compliant. Patient contracts for safety on the unit. Goal: "Going home/getting back to a normal routine" and "continue to do meetings/journal/workbook/planning out all my meetings."

## 2018-04-21 NOTE — BHH Group Notes (Signed)
[facility] LCSW Group Therapy Note  04/21/2018  10:00-11:00AM  Type of Therapy and Topic:  Group Therapy:  Adding Supports Including Being Your Own Support  Participation Level:  Active   Description of Group:  Patients in this group were introduced to the concept that additional supports including self-support are an essential part of recovery.  A song entitled "I Need Help!" was played and a group discussion was held in reaction to the idea of needing to add supports.  A song entitled "My Own Hero" was played and a group discussion ensued in which patients stated they could relate to the song and it inspired them to realize they have be willing to help themselves in order to succeed, because other people cannot achieve sobriety or stability for them.  We discussed adding a variety of healthy supports to address the various needs in their lives.  A song was played called "I Know Where I've Been" toward the end of group and used to conduct an inspirational wrap-up to group of remembering how far they have already come in their journey.  Therapeutic Goals: 1)  demonstrate the importance of being a part of one's own support system 2)  discuss reasons people in one's life may eventually be unable to be continually supportive  3)  identify the patient's current support system and   4)  elicit commitments to add healthy supports and to become more conscious of being self-supportive   Summary of Patient Progress:  The patient expressed that her family and animals (pet dogs) are her healthy supports while her family losses and her children are unhealthy for her.  Other group members were fairly confrontational but in a kind manner with her about her anger at her children for doing what they thought best to help her stop drinking.  She kept saying she was not angry and understood but then would reiterate that she wishes they had done it differently.  Numerous times during group she appeared to be stuck in her  past, asking what to do when one works 70 hours a week and has so many responsibilities and no time to reach out for help.  Others pointed out that she had created time to drink so she could probably make time for treatment such as AA meetings as well.  She stated she understood these points and agreed with them, but appeared to be quite resistant to applying them to herself, arguing that she has too many obligations.   Therapeutic Modalities:   Motivational Interviewing Activity  Maretta Los

## 2018-04-21 NOTE — Progress Notes (Addendum)
[facility] MD Progress Note  04/21/2018 2:22 PM Britnee Mcdevitt  MRN:  [id number]   Subjective: Patient reports today that she is feeling much better today.  She denies any withdrawal symptoms and denies any medication side effects.  She reports that she did sleep better last night and she reports having a good appetite.  She reports that she is hoping to be discharged home soon.  She does report having some sinus congestion and is requesting to have an allergy medication started, because she uses over-the-counter medications at home regularly for allergies.  Denies any suicidal or homicidal ideations and denies any hallucinations today as well.  Objective: Patient's chart and findings reviewed and discussed with treatment team.  Presents in the day room and has been attending groups and interacting with peers and staff appropriately.  Patient is very pleasant, calm, and cooperative.  Patient has a brighter affect and appears more talkative and friendly today.  Seems the patient has come to terms with needing the treatment so that she can improve her life and be discharged and seek continued treatment.  She is future oriented with following up with AA and seeing a therapist and a psychiatrist after discharge  Principal Problem: MDD (major depressive disorder), severe ([place]) Diagnosis:   Patient Active Problem List   Diagnosis Date Noted  . MDD (major depressive disorder), severe ([place]) [F32.2] 04/19/2018  . Depression with anxiety [F41.8] 07/20/2017  . Closed fracture of left clavicle, midshaft [S42.002A] 05/05/2014   Total Time spent with patient: 30 minutes  Past Psychiatric History: See H&P  Past Medical History:  Past Medical History:  Diagnosis Date  . Cancer ([place])   . Closed fracture of left clavicle 05/05/2014  . Medical history non-contributory   . Melanoma in situ of left lower leg [facility])     Past Surgical History:  Procedure Laterality Date  . BREAST SURGERY  1995   lt br bx-neg  .  DILATION AND CURETTAGE OF UTERUS     x2post [place]carage  . MELANOMA EXCISION  2016   left lower leg  . ORIF CLAVICULAR FRACTURE Left 05/05/2014   Procedure: OPEN REDUCTION INTERNAL FIXATION (ORIF) LEFT  CLAVICULAR FRACTURE;  Surgeon: Johnny Bridge, MD;  Location: [place];  Service: Orthopedics;  Laterality: Left;  Marland Kitchen VEIN LIGATION AND STRIPPING     legs   Family History: History reviewed. No pertinent family history. Family Psychiatric  History: See H&P Social History:  Social History   Substance and Sexual Activity  Alcohol Use Yes  . Alcohol/week: 21.0 standard drinks  . Types: 21 Glasses of wine per week   Comment: 3 glasses wine per day     Social History   Substance and Sexual Activity  Drug Use No    Social History   Socioeconomic History  . Marital status: Divorced    Spouse name: Not on file  . Number of children: Not on file  . Years of education: Not on file  . Highest education level: Not on file  Occupational History  . Not on file  Social Needs  . Financial resource strain: Not on file  . Food insecurity:    Worry: Not on file    Inability: Not on file  . Transportation needs:    Medical: Not on file    Non-medical: Not on file  Tobacco Use  . Smoking status: Former Smoker    Last attempt to quit: 04/30/1984    Years since quitting: 33.9  .  Smokeless tobacco: Never Used  Substance and Sexual Activity  . Alcohol use: Yes    Alcohol/week: 21.0 standard drinks    Types: 21 Glasses of wine per week    Comment: 3 glasses wine per day  . Drug use: No  . Sexual activity: Yes  Lifestyle  . Physical activity:    Days per week: Not on file    Minutes per session: Not on file  . Stress: Not on file  Relationships  . Social connections:    Talks on phone: Not on file    Gets together: Not on file    Attends religious service: Not on file    Active member of club or organization: Not on file    Attends meetings of clubs or  organizations: Not on file    Relationship status: Not on file  Other Topics Concern  . Not on file  Social History Narrative  . Not on file   Additional Social History:                         Sleep: Good  Appetite:  Good  Current Medications: Current Facility-Administered Medications  Medication Dose Route Frequency Provider Last Rate Last Dose  . alum & mag hydroxide-simeth (MAALOX/MYLANTA) 200-200-20 MG/5ML suspension 30 mL  30 mL Oral Q4H PRN Money, Lowry Ram, FNP      . chlordiazePOXIDE (LIBRIUM) capsule 25 mg  25 mg Oral Q6H PRN Money, Lowry Ram, FNP   25 mg at 04/20/18 2113  . FLUoxetine (PROZAC) capsule 40 mg  40 mg Oral Daily Money, Lowry Ram, FNP   40 mg at 04/21/18 0754  . hydrOXYzine (ATARAX/VISTARIL) tablet 50 mg  50 mg Oral Q6H PRN Money, Lowry Ram, FNP   50 mg at 04/21/18 1307  . lisinopril (PRINIVIL,ZESTRIL) tablet 10 mg  10 mg Oral Daily Money, Lowry Ram, FNP   10 mg at 04/21/18 0754  . loperamide (IMODIUM) capsule 2-4 mg  2-4 mg Oral PRN Money, Lowry Ram, FNP      . loratadine (CLARITIN) tablet 10 mg  10 mg Oral Daily Money, Lowry Ram, FNP   10 mg at 04/21/18 1014  . magnesium hydroxide (MILK OF MAGNESIA) suspension 30 mL  30 mL Oral Daily PRN Money, Lowry Ram, FNP      . multivitamin with minerals tablet 1 tablet  1 tablet Oral Daily Money, Lowry Ram, FNP   1 tablet at 04/21/18 0755  . ondansetron (ZOFRAN-ODT) disintegrating tablet 4 mg  4 mg Oral Q6H PRN Money, Lowry Ram, FNP      . thiamine (B-1) injection 100 mg  100 mg Intramuscular Once Money, Darnelle Maffucci B, FNP      . thiamine (VITAMIN B-1) tablet 100 mg  100 mg Oral Daily Money, Lowry Ram, FNP   100 mg at 04/21/18 0755  . traZODone (DESYREL) tablet 50 mg  50 mg Oral QHS PRN Cobos, Myer Peer, MD   50 mg at 04/20/18 2113    Lab Results:  Results for orders placed or performed during the hospital encounter of 04/19/18 (from the past 48 hour(s))  TSH     Status: Abnormal   Collection Time: 04/20/18  6:26 AM   Result Value Ref Range   TSH 6.123 (H) 0.350 - 4.500 uIU/mL    Comment: Performed by a 3rd Generation assay with a functional sensitivity of <=0.01 uIU/mL. Performed at [facility], [place] [address], [place], [place] [address]  T4, free     Status: Abnormal   Collection Time: 04/21/18  6:46 AM  Result Value Ref Range   Free T4 0.71 (L) 0.82 - 1.77 ng/dL    Comment: (NOTE) Biotin ingestion may interfere with free T4 tests. If the results are inconsistent with the TSH level, previous test results, or the clinical presentation, then consider biotin interference. If needed, order repeat testing after stopping biotin. Performed at [facility], [place] [address], [place], [place] [address]     Blood Alcohol level:  Lab Results  Component Value Date   ETH 76 (H) 09/32/3557    Metabolic Disorder Labs: No results found for: HGBA1C, MPG No results found for: PROLACTIN No results found for: CHOL, TRIG, HDL, CHOLHDL, VLDL, LDLCALC  Physical Findings: AIMS: Facial and Oral Movements Muscles of Facial Expression: None, normal Lips and Perioral Area: None, normal Jaw: None, normal Tongue: None, normal,Extremity Movements Upper (arms, wrists, hands, fingers): Minimal(Very mild tremor of bilateral arms) Lower (legs, knees, ankles, toes): None, normal, Trunk Movements Neck, shoulders, hips: None, normal, Overall Severity Severity of abnormal movements (highest score from questions above): None, normal Incapacitation due to abnormal movements: None, normal Patient's awareness of abnormal movements (rate only patient's report): No Awareness, Dental Status Current problems with teeth and/or dentures?: No Does patient usually wear dentures?: No  CIWA:  CIWA-Ar Total: 2 COWS:  COWS Total Score: 3  Musculoskeletal: Strength & Muscle Tone: within normal limits Gait & Station: normal Patient leans: N/A  Psychiatric Specialty Exam: Physical Exam  Nursing note and  vitals reviewed. Constitutional: She is oriented to person, place, and time. She appears well-developed and well-nourished.  Cardiovascular: Normal rate.  Respiratory: Effort normal.  Musculoskeletal: Normal range of motion.  Neurological: She is alert and oriented to person, place, and time.  Skin: Skin is warm.    Review of Systems  Constitutional: Negative.   HENT: Negative.   Eyes: Negative.   Respiratory: Negative.   Cardiovascular: Negative.   Gastrointestinal: Negative.   Genitourinary: Negative.   Musculoskeletal: Negative.   Skin: Negative.   Neurological: Negative.   Endo/Heme/Allergies: Negative.   Psychiatric/Behavioral: The patient is nervous/anxious.     Blood pressure 119/67, pulse 78, temperature 98.2 F (36.8 C), temperature source Oral, resp. rate 17, height 5\' 5"  (1.651 m), weight 72.6 kg.Body mass index is 26.63 kg/m.  General Appearance: Casual  Eye Contact:  Good  Speech:  Clear and Coherent and Normal Rate  Volume:  Normal  Mood:  Euthymic  Affect:  Congruent  Thought Process:  Goal Directed and Descriptions of Associations: Intact  Orientation:  Full (Time, Place, and Person)  Thought Content:  WDL  Suicidal Thoughts:  No  Homicidal Thoughts:  No  Memory:  Immediate;   Good Recent;   Fair Remote;   Good  Judgement:  Fair  Insight:  Fair  Psychomotor Activity:  Normal  Concentration:  Concentration: Good  Recall:  Good  Fund of Knowledge:  Good  Language:  Good  Akathisia:  No  Handed:  Right  AIMS (if indicated):     Assets:  Communication Skills Desire for Improvement Housing Physical Health Social Support Transportation  ADL's:  Intact  Cognition:  WNL  Sleep:  Number of Hours: 6.5   Problems addressed MDD severe Alcohol dependence Allergic rhinitis  Treatment Plan Summary: Daily contact with patient to assess and evaluate symptoms and progress in treatment, Medication management and Plan is to: Continue Librium as needed  protocol for  alcohol withdrawal Continue Prozac 40 mg p.o. daily for mood stability Continue Vistaril 50 mg p.o. every 6 hours as needed for anxiety Continue lisinopril 10 mg p.o. daily for hypertension Start Claritin 10 mg p.o. daily for seasonal allergies Continue trazodone 50 mg p.o. nightly as needed for insomnia Encourage group therapy participation Potential discharge for tomorrow  Lewis Shock, FNP 04/21/2018, 2:22 PM   ..Agree with NP Progress Note

## 2018-04-22 DIAGNOSIS — F1020 Alcohol dependence, uncomplicated: Secondary | ICD-10-CM

## 2018-04-22 DIAGNOSIS — J302 Other seasonal allergic rhinitis: Secondary | ICD-10-CM

## 2018-04-22 LAB — T3, FREE: T3 FREE: 3.4 pg/mL (ref 2.0–4.4)

## 2018-04-22 MED ORDER — HYDROXYZINE HCL 50 MG PO TABS
50.0000 mg | ORAL_TABLET | Freq: Four times a day (QID) | ORAL | Status: DC | PRN
  Administered 2018-04-23: 50 mg via ORAL
  Filled 2018-04-22: qty 1
  Filled 2018-04-22: qty 10

## 2018-04-22 NOTE — [facility] Group Notes (Signed)
LCSW Group Therapy Note   04/22/2018 1:15pm   Type of Therapy and Topic:  Group Therapy:  Overcoming Obstacles   Participation Level:  Active   Description of Group:    In this group patients will be encouraged to explore what they see as obstacles to their own wellness and recovery. They will be guided to discuss their thoughts, feelings, and behaviors related to these obstacles. The group will process together ways to cope with barriers, with attention given to specific choices patients can make. Each patient will be challenged to identify changes they are motivated to make in order to overcome their obstacles. This group will be process-oriented, with patients participating in exploration of their own experiences as well as giving and receiving support and challenge from other group members.   Therapeutic Goals: 1. Patient will identify personal and current obstacles as they relate to admission. 2. Patient will identify barriers that currently interfere with their wellness or overcoming obstacles.  3. Patient will identify feelings, thought process and behaviors related to these barriers. 4. Patient will identify two changes they are willing to make to overcome these obstacles:      Summary of Patient Progress   Lanisha was attentive and engaged during today's processing group. She shared that her biggest obstacle involves managing depression and alcohol abuse. She is hoping to follow-up at [facility] at discharge and has plans to "get into AA and get a sponsor." Roman identified her adult children as her support network. Shayleen continues to show progress in the group setting with improving insight.    Therapeutic Modalities:   Cognitive Behavioral Therapy Solution Focused Therapy Motivational Interviewing Relapse Prevention Therapy  Avelina Laine, LCSW 04/22/2018 2:49 PM

## 2018-04-22 NOTE — Plan of Care (Signed)
  Problem: Safety: Goal: Periods of time without injury will increase Outcome: Progressing Note:  Pt has not harmed self or others tonight.  She denies SI/HI and verbally contracts for safety.    

## 2018-04-22 NOTE — Progress Notes (Signed)
D.  Pt pleasant on approach, denies complaints at this time.  Pt hopeful for discharge tomorrow.  Pt was positive for evening AA group, observed engaged in appropriate interaction with peers on the unit.   Pt denies SI/HI/AVH at this time.  A.  Support and encouragement offered, medication given as ordered  R.  Pt remains safe on the unit, will continue to monitor.

## 2018-04-22 NOTE — Progress Notes (Addendum)
[facility] MD Progress Note  04/22/2018 11:41 AM [place] Jarquin  MRN:  [id number]   Subjective: Chimere reports, "I was committed to the hospital by my children because they thought I was going to hurt myself. I don't know why they think that. I was just stressed out at home & I started drinking a lot of wine just to cope. My parents died back to back. My son was recently diagnosed with crohn's disease. I should have handled my stress a little better, but I did not. That is not a reason to be locked up in here. I am not depressed today or prior to coming to the hospital. I'm not having any alcohol withdrawal symptoms. I am sad today because I am not going to be able to go home today. I'm attending all the group sessions. I planned to attend the [place] meetings after discharge to manage my drinking. I'm sleeping well. I am ready to get out of here".  52 year old single female, has three adult children, lives with adult daughter,employed as Pharmacist, hospital. Was admitted to hospital under IVC. Patient states " the cops showed up at my house on Thursday night, and brought me to the hospital". As per IVC , patient had sent text message to her children indicating she might kill herself, had been angry and aggressive towards daughter, and had been drinking a bottle of wine per day, driving under the influence. Patient reports she does not remember behaving as above, but does  report she did recently blackout so does not remember sending above messages . She states she has not had suicidal ideations, but has been sad related to recent stressors, losses . She states she does feel alcohol " has gotten out of control lately".   Objective: Hideko is seen, chart reviewed. The chart findings discussed with the treatment team. She presents alert, oriented & aware of situation. She is visible on the unit, attending group sessions. She is interacting well with peers and staff.  Patient is very pleasant, calm, and cooperative.  Patient has a  brighter affect and appears more talkative and friendly today. She denies any symptoms of depression or anxiety. She says she is sleeping well. She is tolerating her treatment regimen. She is future oriented with following up with AA and seeing a therapist and a psychiatrist after discharge. She currently denies any SIHI, AVH, delusional thoughts or paranoia. She does not appear to be responding to any internal stimuli. Niley has agreed to continue current plan of care as already in progress.  Principal Problem: MDD (major depressive disorder), severe ([place]) Diagnosis:   Patient Active Problem List   Diagnosis Date Noted  . Alcohol dependence ([place]) [F10.20] 04/21/2018  . Seasonal allergic rhinitis [J30.2] 04/21/2018  . MDD (major depressive disorder), severe ([place]) [F32.2] 04/19/2018  . Depression with anxiety [F41.8] 07/20/2017  . Closed fracture of left clavicle, midshaft [S42.002A] 05/05/2014   Total Time spent with patient: 15 minutes  Past Psychiatric History: See H&P  Past Medical History:  Past Medical History:  Diagnosis Date  . Cancer ([place])   . Closed fracture of left clavicle 05/05/2014  . Medical history non-contributory   . Melanoma in situ of left lower leg [facility])     Past Surgical History:  Procedure Laterality Date  . BREAST SURGERY  1995   lt br bx-neg  . DILATION AND CURETTAGE OF UTERUS     x2post misscarage  . MELANOMA EXCISION  2016   left lower leg  .  ORIF CLAVICULAR FRACTURE Left 05/05/2014   Procedure: OPEN REDUCTION INTERNAL FIXATION (ORIF) LEFT  CLAVICULAR FRACTURE;  Surgeon: Johnny Bridge, MD;  Location: [facility];  Service: Orthopedics;  Laterality: Left;  Marland Kitchen VEIN LIGATION AND STRIPPING     legs   Family History: History reviewed. No pertinent family history.  Family Psychiatric  History: See H&P  Social History:  Social History   Substance and Sexual Activity  Alcohol Use Yes  . Alcohol/week: 21.0 standard drinks  . Types: 21  Glasses of wine per week   Comment: 3 glasses wine per day     Social History   Substance and Sexual Activity  Drug Use No    Social History   Socioeconomic History  . Marital status: Divorced    Spouse name: Not on file  . Number of children: Not on file  . Years of education: Not on file  . Highest education level: Not on file  Occupational History  . Not on file  Social Needs  . Financial resource strain: Not on file  . Food insecurity:    Worry: Not on file    Inability: Not on file  . Transportation needs:    Medical: Not on file    Non-medical: Not on file  Tobacco Use  . Smoking status: Former Smoker    Last attempt to quit: 04/30/1984    Years since quitting: 34.0  . Smokeless tobacco: Never Used  Substance and Sexual Activity  . Alcohol use: Yes    Alcohol/week: 21.0 standard drinks    Types: 21 Glasses of wine per week    Comment: 3 glasses wine per day  . Drug use: No  . Sexual activity: Yes  Lifestyle  . Physical activity:    Days per week: Not on file    Minutes per session: Not on file  . Stress: Not on file  Relationships  . Social connections:    Talks on phone: Not on file    Gets together: Not on file    Attends religious service: Not on file    Active member of club or organization: Not on file    Attends meetings of clubs or organizations: Not on file    Relationship status: Not on file  Other Topics Concern  . Not on file  Social History Narrative  . Not on file   Additional Social History:   Sleep: Good  Appetite:  Good  Current Medications: Current Facility-Administered Medications  Medication Dose Route Frequency Provider Last Rate Last Dose  . alum & mag hydroxide-simeth (MAALOX/MYLANTA) 200-200-20 MG/5ML suspension 30 mL  30 mL Oral Q4H PRN Money, Lowry Ram, FNP      . chlordiazePOXIDE (LIBRIUM) capsule 25 mg  25 mg Oral Q6H PRN Money, Lowry Ram, FNP   25 mg at 04/21/18 2143  . FLUoxetine (PROZAC) capsule 40 mg  40 mg Oral  Daily Money, Lowry Ram, FNP   40 mg at 04/22/18 0748  . hydrOXYzine (ATARAX/VISTARIL) tablet 50 mg  50 mg Oral Q6H PRN Money, Lowry Ram, FNP   50 mg at 04/22/18 1007  . lisinopril (PRINIVIL,ZESTRIL) tablet 10 mg  10 mg Oral Daily Money, Lowry Ram, FNP   10 mg at 04/22/18 0747  . loperamide (IMODIUM) capsule 2-4 mg  2-4 mg Oral PRN Money, Lowry Ram, FNP      . loratadine (CLARITIN) tablet 10 mg  10 mg Oral Daily Money, Lowry Ram, FNP   10 mg at 04/22/18  48  . magnesium hydroxide (MILK OF MAGNESIA) suspension 30 mL  30 mL Oral Daily PRN Money, Lowry Ram, FNP      . multivitamin with minerals tablet 1 tablet  1 tablet Oral Daily Money, Lowry Ram, FNP   1 tablet at 04/22/18 0747  . ondansetron (ZOFRAN-ODT) disintegrating tablet 4 mg  4 mg Oral Q6H PRN Money, Lowry Ram, FNP      . thiamine (B-1) injection 100 mg  100 mg Intramuscular Once Money, Darnelle Maffucci B, FNP      . thiamine (VITAMIN B-1) tablet 100 mg  100 mg Oral Daily Money, Travis B, FNP   100 mg at 04/22/18 0747  . traZODone (DESYREL) tablet 50 mg  50 mg Oral QHS PRN Cobos, Myer Peer, MD   50 mg at 04/21/18 2143    Lab Results:  Results for orders placed or performed during the hospital encounter of 04/19/18 (from the past 48 hour(s))  T3, free     Status: None   Collection Time: 04/21/18  6:46 AM  Result Value Ref Range   T3, Free 3.4 2.0 - 4.4 pg/mL    Comment: (NOTE) Performed At: [facility] [place], [place] [id number] Rush Farmer MD NG:[id number]   T4, free     Status: Abnormal   Collection Time: 04/21/18  6:46 AM  Result Value Ref Range   Free T4 0.71 (L) 0.82 - 1.77 ng/dL    Comment: (NOTE) Biotin ingestion may interfere with free T4 tests. If the results are inconsistent with the TSH level, previous test results, or the clinical presentation, then consider biotin interference. If needed, order repeat testing after stopping biotin. Performed at [facility], [place] [address], [place],  [place] [address]    Blood Alcohol level:  Lab Results  Component Value Date   ETH 76 (H) 72/53/6644   Metabolic Disorder Labs: No results found for: HGBA1C, MPG No results found for: PROLACTIN No results found for: CHOL, TRIG, HDL, CHOLHDL, VLDL, LDLCALC  Physical Findings: AIMS: Facial and Oral Movements Muscles of Facial Expression: None, normal Lips and Perioral Area: None, normal Jaw: None, normal Tongue: None, normal,Extremity Movements Upper (arms, wrists, hands, fingers): Minimal(Very mild tremor of bilateral arms) Lower (legs, knees, ankles, toes): None, normal, Trunk Movements Neck, shoulders, hips: None, normal, Overall Severity Severity of abnormal movements (highest score from questions above): None, normal Incapacitation due to abnormal movements: None, normal Patient's awareness of abnormal movements (rate only patient's report): No Awareness, Dental Status Current problems with teeth and/or dentures?: No Does patient usually wear dentures?: No  CIWA:  CIWA-Ar Total: 0 COWS:  COWS Total Score: 3  Musculoskeletal: Strength & Muscle Tone: within normal limits Gait & Station: normal Patient leans: N/A  Psychiatric Specialty Exam: Physical Exam  Nursing note and vitals reviewed. Constitutional: She is oriented to person, place, and time. She appears well-developed and well-nourished.  Cardiovascular: Normal rate.  Respiratory: Effort normal.  Musculoskeletal: Normal range of motion.  Neurological: She is alert and oriented to person, place, and time.  Skin: Skin is warm.    Review of Systems  Constitutional: Negative.   HENT: Negative.   Eyes: Negative.   Respiratory: Negative.   Cardiovascular: Negative.   Gastrointestinal: Negative.   Genitourinary: Negative.   Musculoskeletal: Negative.   Skin: Negative.   Neurological: Negative.   Endo/Heme/Allergies: Negative.   Psychiatric/Behavioral: The patient is nervous/anxious.     Blood pressure 118/74, pulse  (!) 108, temperature 97.6 F (36.4  C), resp. rate 16, height 5\' 5"  (1.651 m), weight 72.6 kg, SpO2 98 %.Body mass index is 26.63 kg/m.  General Appearance: Casual  Eye Contact:  Good  Speech:  Clear and Coherent and Normal Rate  Volume:  Normal  Mood:  Euthymic  Affect:  Congruent  Thought Process:  Goal Directed and Descriptions of Associations: Intact  Orientation:  Full (Time, Place, and Person)  Thought Content:  WDL  Suicidal Thoughts:  No  Homicidal Thoughts:  No  Memory:  Immediate;   Good Recent;   Fair Remote;   Good  Judgement:  Fair  Insight:  Fair  Psychomotor Activity:  Normal  Concentration:  Concentration: Good  Recall:  Good  Fund of Knowledge:  Good  Language:  Good  Akathisia:  No  Handed:  Right  AIMS (if indicated):     Assets:  Communication Skills Desire for Improvement Housing Physical Health Social Support Transportation  ADL's:  Intact  Cognition:  WNL  Sleep:  Number of Hours: 6.25   Problems addressed MDD severe Alcohol dependence Allergic rhinitis  Treatment Plan Summary: Daily contact with patient to assess and evaluate symptoms and progress in treatment.  - Continue inpatient hospitalization.  - Will continue today 04/22/2018 plan as below except where it is noted.  - Continue Librium as needed protocol for alcohol withdrawal.  - Continue Prozac 40 mg p.o. daily for mood stability.  - Continue Vistaril 50 mg p.o. every 6 hours as needed for anxiety.  - Continue lisinopril 10 mg p.o. daily for hypertension.  - Continue Claritin 10 mg p.o. daily for seasonal allergies.  - Continue trazodone 50 mg p.o. nightly as needed for insomnia.  - Encourage group therapy participation.  - Discharge disposition plan is ongoing.  Lindell Spar, NP, PMHNP, FNP-BC. 04/22/2018, 11:41 AMPatient ID: Lauree Chandler, female   DOB: January 21, 1966, 52 y.o.   MRN: [id number] .Marland KitchenAgree with NP Progress Note

## 2018-04-22 NOTE — Plan of Care (Signed)
D: Patient presents anxious. She is requesting to discharge, and asking when she can meet with the doctor. Educated patient on when she will meet with doctor. She slept well last night, but and received medication that was helpful. Her appetite is good, energy normal and concentration good. She rates her depression and hopelessness 0/10. Her anxiety is 5/10. She denies physical complaints or withdrawal symptoms. Patient denies SI/HI/AVH.  A: Patient checked q15 min, and checks reviewed. Reviewed medication changes with patient and educated on side effects. Educated patient on importance of attending group therapy sessions and educated on several coping skills. Encouarged participation in milieu through recreation therapy and attending meals with peers. Support and encouragement provided. Fluids offered. R: Patient receptive to education on medications, and is medication compliant. Patient contracts for safety on the unit. Goal: "Go home" and "Please let me go home, I am ready and I have my plan all set."

## 2018-04-22 NOTE — Progress Notes (Signed)
Recreation Therapy Notes  Date: 10.21.19 Time: 0930 Location: [address] Dayroom  Group Topic: Stress Management  Goal Area(s) Addresses:  Patient will verbalize importance of using healthy stress management.  Patient will identify positive emotions associated with healthy stress management.   Behavioral Response: Engaged  Intervention: Stress Management  Activity : Meditation.  LRT introduced the stress management technique of meditation.  LRT played Elliott meditation that dealt with impermanence.  Patients were to follow along as the meditation played to engage in the meditation.  Education:  Stress Management, Discharge Planning.   Education Outcome: Acknowledges edcuation/In group clarification offered/Needs additional education  Clinical Observations/Feedback: Pt attended and participated in group.    Kim Elliott, LRT/CTRS         Kim Elliott, Kim Elliott 04/22/2018 11:06 AM

## 2018-04-22 NOTE — Tx Team (Signed)
Interdisciplinary Treatment and Diagnostic Plan Update  04/22/2018 Time of Session: [place] MRN: [id number]  Principal Diagnosis: MDD (major depressive disorder), severe ([place])  Secondary Diagnoses: Principal Problem:   MDD (major depressive disorder), severe ([place]) Active Problems:   Alcohol dependence ([place])   Seasonal allergic rhinitis   Current Medications:  Current Facility-Administered Medications  Medication Dose Route Frequency Provider Last Rate Last Dose  . alum & mag hydroxide-simeth (MAALOX/MYLANTA) 200-200-20 MG/5ML suspension 30 mL  30 mL Oral Q4H PRN Money, Lowry Ram, FNP      . chlordiazePOXIDE (LIBRIUM) capsule 25 mg  25 mg Oral Q6H PRN Money, Lowry Ram, FNP   25 mg at 04/21/18 2143  . FLUoxetine (PROZAC) capsule 40 mg  40 mg Oral Daily Money, Lowry Ram, FNP   40 mg at 04/22/18 0748  . hydrOXYzine (ATARAX/VISTARIL) tablet 50 mg  50 mg Oral Q6H PRN Money, Lowry Ram, FNP   50 mg at 04/21/18 2143  . lisinopril (PRINIVIL,ZESTRIL) tablet 10 mg  10 mg Oral Daily Money, Lowry Ram, FNP   10 mg at 04/22/18 0747  . loperamide (IMODIUM) capsule 2-4 mg  2-4 mg Oral PRN Money, Lowry Ram, FNP      . loratadine (CLARITIN) tablet 10 mg  10 mg Oral Daily Money, Lowry Ram, FNP   10 mg at 04/22/18 0748  . magnesium hydroxide (MILK OF MAGNESIA) suspension 30 mL  30 mL Oral Daily PRN Money, Lowry Ram, FNP      . multivitamin with minerals tablet 1 tablet  1 tablet Oral Daily Money, Lowry Ram, FNP   1 tablet at 04/22/18 0747  . ondansetron (ZOFRAN-ODT) disintegrating tablet 4 mg  4 mg Oral Q6H PRN Money, Lowry Ram, FNP      . thiamine (B-1) injection 100 mg  100 mg Intramuscular Once Money, Darnelle Maffucci B, FNP      . thiamine (VITAMIN B-1) tablet 100 mg  100 mg Oral Daily Money, Travis B, FNP   100 mg at 04/22/18 0747  . traZODone (DESYREL) tablet 50 mg  50 mg Oral QHS PRN Cobos, Myer Peer, MD   50 mg at 04/21/18 2143   PTA Medications: Medications Prior to Admission  Medication Sig Dispense  Refill Last Dose  . BIOTIN PO Take 10,000 mg by mouth daily.   04/18/2018 at Unknown time  . erythromycin ophthalmic ointment Place 1 application into the right eye 2 (two) times daily. (Patient not taking: Reported on 04/18/2018) 1 g 0 Not Taking at Unknown time  . FLUoxetine (PROZAC) 40 MG capsule Take 1 capsule (40 mg total) by mouth daily. 90 capsule 3 04/18/2018 at Unknown time  . loratadine (CLARITIN) 10 MG tablet Take 10 mg by mouth daily as needed for allergies.   04/18/2018 at Unknown time  . LORazepam (ATIVAN) 1 MG tablet TAKE 1 TABLET (1 MG TOTAL) BY MOUTH 2 (TWO) TIMES DAILY AS NEEDED FOR ANXIETY. (Patient not taking: Reported on 04/18/2018) 180 tablet 1 Not Taking at Unknown time  . LORazepam (ATIVAN) 1 MG tablet TAKE 1 TABLET (1 MG TOTAL) BY MOUTH 2 (TWO) TIMES DAILY AS NEEDED FOR ANXIETY. (Patient taking differently: Take 1 mg by mouth daily. ) 180 tablet 1 04/18/2018 at Unknown time  . Multiple Vitamin (MULTIVITAMIN) tablet Take 1 tablet by mouth daily.   04/18/2018 at Unknown time    Patient Stressors: Loss of father Substance abuse  Patient Strengths: Ability for insight Motivation for treatment/growth  Treatment Modalities: Medication Management, Group therapy, Case management,  1 to 1 session with clinician, Psychoeducation, Recreational therapy.   Physician Treatment Plan for Primary Diagnosis: MDD (major depressive disorder), severe ([place]) Long Term Goal(s): Improvement in symptoms so as ready for discharge Improvement in symptoms so as ready for discharge   Short Term Goals: Ability to identify changes in lifestyle to reduce recurrence of condition will improve Ability to verbalize feelings will improve Ability to disclose and discuss suicidal ideas Ability to demonstrate self-control will improve Ability to identify and develop effective coping behaviors will improve Ability to maintain clinical measurements within normal limits will improve Compliance with  prescribed medications will improve Ability to identify triggers associated with substance abuse/mental health issues will improve  Medication Management: Evaluate patient's response, side effects, and tolerance of medication regimen.  Therapeutic Interventions: 1 to 1 sessions, Unit Group sessions and Medication administration.  Evaluation of Outcomes: Progressing  Physician Treatment Plan for Secondary Diagnosis: Principal Problem:   MDD (major depressive disorder), severe ([place]) Active Problems:   Alcohol dependence ([place])   Seasonal allergic rhinitis  Long Term Goal(s): Improvement in symptoms so as ready for discharge Improvement in symptoms so as ready for discharge   Short Term Goals: Ability to identify changes in lifestyle to reduce recurrence of condition will improve Ability to verbalize feelings will improve Ability to disclose and discuss suicidal ideas Ability to demonstrate self-control will improve Ability to identify and develop effective coping behaviors will improve Ability to maintain clinical measurements within normal limits will improve Compliance with prescribed medications will improve Ability to identify triggers associated with substance abuse/mental health issues will improve     Medication Management: Evaluate patient's response, side effects, and tolerance of medication regimen.  Therapeutic Interventions: 1 to 1 sessions, Unit Group sessions and Medication administration.  Evaluation of Outcomes: Progressing   RN Treatment Plan for Primary Diagnosis: MDD (major depressive disorder), severe ([place]) Long Term Goal(s): Knowledge of disease and therapeutic regimen to maintain health will improve  Short Term Goals: Ability to remain free from injury will improve, Ability to verbalize frustration and anger appropriately will improve, Ability to participate in decision making will improve and Ability to disclose and discuss suicidal ideas  Medication  Management: RN will administer medications as ordered by provider, will assess and evaluate patient's response and provide education to patient for prescribed medication. RN will report any adverse and/or side effects to prescribing provider.  Therapeutic Interventions: 1 on 1 counseling sessions, Psychoeducation, Medication administration, Evaluate responses to treatment, Monitor vital signs and CBGs as ordered, Perform/monitor CIWA, COWS, AIMS and Fall Risk screenings as ordered, Perform wound care treatments as ordered.  Evaluation of Outcomes: Progressing   LCSW Treatment Plan for Primary Diagnosis: MDD (major depressive disorder), severe ([place]) Long Term Goal(s): Safe transition to appropriate next level of care at discharge, Engage patient in therapeutic group addressing interpersonal concerns.  Short Term Goals: Engage patient in aftercare planning with referrals and resources, Facilitate patient progression through stages of change regarding substance use diagnoses and concerns and Identify triggers associated with mental health/substance abuse issues  Therapeutic Interventions: Assess for all discharge needs, 1 to 1 time with Social worker, Explore available resources and support systems, Assess for adequacy in community support network, Educate family and significant other(s) on suicide prevention, Complete Psychosocial Assessment, Interpersonal group therapy.  Evaluation of Outcomes: Progressing   Progress in Treatment: Attending groups: Yes. Participating in groups: Yes. Taking medication as prescribed: Yes. Toleration medication: Yes. Family/Significant other contact made: Yes, individual(s) contacted:  pt's son Patient  understands diagnosis: Yes. Discussing patient identified problems/goals with staff: Yes. Medical problems stabilized or resolved: Yes. Denies suicidal/homicidal ideation: Yes. Issues/concerns per patient self-inventory: No. Other: n/a   New problem(s)  identified: No, Describe:  n/a  New Short Term/Long Term Goal(s): detox, medication management for mood stabilization; elimination of SI thoughts; development of comprehensive mental wellness/sobriety plan.   Patient Goals:  "to get help with alcohol detox and grief."   Discharge Plan or Barriers: Pt has follow-up with Dr. Sarajane Jews at [facility] and has been referred to [facility] for [facility] and therapy. Pt also provided with Hospice Grief counseling information. [place] pamphlet, Mobile Crisis information, and AA/NA information provided to patient for additional community support and resources.   Reason for Continuation of Hospitalization: Anxiety Depression Medication stabilization Withdrawal symptoms  Estimated Length of Stay: Tuesday, 04/23/18  Attendees: Patient: 04/22/2018 9:26 AM  Physician: Dr. Nancy Fetter MD; Dr. Parke Poisson MD 04/22/2018 9:26 AM  Nursing: Yetta Flock RN; Roderic Palau RN 04/22/2018 9:26 AM  RN Care Manager:x 04/22/2018 9:26 AM  Social Worker: Janice Norrie LCSW 04/22/2018 9:26 AM  Recreational Therapist: x 04/22/2018 9:26 AM  Other: Ricky Ala NP; Lindell Spar NP 04/22/2018 9:26 AM  Other:  04/22/2018 9:26 AM  Other: 04/22/2018 9:26 AM    Scribe for Treatment Team: Avelina Laine, LCSW 04/22/2018 9:26 AM

## 2018-04-22 NOTE — Progress Notes (Signed)
Patient did attend the evening speaker AA meeting.  

## 2018-04-22 NOTE — Progress Notes (Signed)
D: Pt was in dayroom upon initial approach.  Pt presents with appropriate affect and mood.  When asked about her day, she reports it was "a little stressful because I thought I was going home and I didn't."  Pt reports she is "going home hopefully tomorrow."  Pt reports she feels safe to discharge and has her aftercare appointments arranged.  Pt denies SI/HI, denies hallucinations, denies pain.  Pt has been visible in milieu interacting with peers and staff appropriately.  Pt attended evening group.    A: Introduced self to pt.  Met with pt 1:1.  Actively listened to pt and offered support and encouragement.   PRN medication administered for sleep.  Q15 minute safety checks maintained.  R: Pt is safe on the unit.  Pt is compliant with medications.  Pt verbally contracts for safety and reports she will inform staff of needs and concerns.  Will continue to monitor and assess.

## 2018-04-23 MED ORDER — LISINOPRIL 10 MG PO TABS: 10.0000 mg | ORAL_TABLET | Freq: Every day | ORAL | 0 refills | Status: DC

## 2018-04-23 MED ORDER — HYDROXYZINE HCL 50 MG PO TABS: 50.0000 mg | ORAL_TABLET | Freq: Four times a day (QID) | ORAL | 0 refills | Status: DC | PRN

## 2018-04-23 MED ORDER — TRAZODONE HCL 50 MG PO TABS: 50.0000 mg | ORAL_TABLET | Freq: Every evening | ORAL | 0 refills | Status: DC | PRN

## 2018-04-23 NOTE — Progress Notes (Signed)
  [facility] Adult Case Management Discharge Plan :  Will you be returning to the same living situation after discharge:  Yes,  home At discharge, do you have transportation home?: Yes,  family member Do you have the ability to pay for your medications: Yes,  medicaid  Release of information consent forms completed and submitted to medical records by CSW.   Patient to Follow up at: Follow-up Information    [facility] Follow up.   Why:  Please walk in within 3 days of hospital discharge to be assessed for substance abuse intensive outpatient program/therapy. Walk in hours: Monday, Wednesday, and Friday bewteen 11:00AM-12:30PM. Thank you.  Contact information: [place], [place] [address] Phone: [phone] Fax: [phone]       [facility] Follow up on 04/30/2018.   Specialty:  Family Medicine Why:  Hospital follow-up with Dr. Sarajane Jews on Tuesday, 10/29 at 10:45AM. Please arrive 15 minutes early for check in.  Contact information: [place] [phone]          Next level of care provider has access to [place] and Suicide Prevention discussed: Yes,  SPE completed with pt and her son. SPI pamphlet and mobile crisis information provided.   Have you used any form of tobacco in the last 30 days? (Cigarettes, Smokeless Tobacco, Cigars, and/or Pipes): No  Has patient been referred to the Quitline?: N/A patient is not a smoker  Patient has been referred for addiction treatment: Yes  Avelina Laine, LCSW 04/23/2018, 8:47 AM

## 2018-04-23 NOTE — Progress Notes (Signed)
Patient ID: Kim Elliott, female   DOB: June 28, 1966, 52 y.o.   MRN: [id number]  Discharge Note  D) Patient discharged to lobby. Patient states readiness for discharge. Patient denies SI/HI, AVH and is not delusional or psychotic. Patient in no acute distress. Patient has completed their Suicide Safety Plan. Patient provided an opportunity to complete and return Patient Satisfaction Survey.   A) Written and verbal discharge instructions given to the patient. Patient accepting to information and verbalized understanding. Patient agrees to the discharge plan. Opportunity for questions and concerns presented to patient. Patient denied any further questions or concerns. All belongings returned to patient. Patient signed for return of belongings and discharge paperwork. Patient provided a copy of their Suicide Safety Plan and has been provided a copy of the Patient Satisfaction Survey with return instructions.  R) Patient safely escorted to the lobby. Patient discharged from [facility] with medication samples, prescriptions, personal belongings, follow-up appointment in place and discharge paperwork.

## 2018-04-23 NOTE — Progress Notes (Signed)
Nursing Progress Note [phone]  Data: Patient presents pleasant and anxious for discharge. Patient complaint with scheduled medications. Patient denies pain/physical complaints. Patient completed self-inventory sheet and rates depression, hopelessness, and anxiety 0, 0 and 5 respectively. Patient rates their sleep and appetite as good and good respectively. Patient states goal for today is to "go home". Patient is seen attending groups and visible in the milieu. Patient currently denies SI/HI/AVH.   Action: Patient is educated about and provided medication per provider's orders. Patient safety maintained with q15 min safety checks and frequent rounding. Low fall risk precautions in place. Emotional support given. 1:1 interaction and active listening provided. Patient encouraged to attend meals, groups, and work on treatment plan and goals. Labs, vital signs and patient behavior monitored throughout shift.   Response: Patient remains safe on the unit at this time and agrees to come to staff with any issues/concerns. Patient is interacting with peers appropriately on the unit. Will continue to support and monitor.

## 2018-04-23 NOTE — BHH Suicide Risk Assessment (Signed)
[name] Discharge Suicide Risk Assessment   Principal Problem: MDD (major depressive disorder), severe [facility]) Discharge Diagnoses:  Patient Active Problem List   Diagnosis Date Noted  . Alcohol dependence ([place]) [F10.20] 04/21/2018  . Seasonal allergic rhinitis [J30.2] 04/21/2018  . MDD (major depressive disorder), severe ([place]) [F32.2] 04/19/2018  . Depression with anxiety [F41.8] 07/20/2017  . Closed fracture of left clavicle, midshaft [S42.002A] 05/05/2014    Total Time spent with patient: 15 minutes  Musculoskeletal: Strength & Muscle Tone: within normal limits Gait & Station: normal Patient leans: N/A  Psychiatric Specialty Exam: Review of Systems  All other systems reviewed and are negative.   Blood pressure 119/86, pulse 98, temperature 97.6 F (36.4 C), temperature source Oral, resp. rate 18, height 5\' 5"  (1.651 m), weight 72.6 kg, SpO2 98 %.Body mass index is 26.63 kg/m.  General Appearance: Casual  Eye Contact::  Good  Speech:  Normal Rate409  Volume:  Normal  Mood:  Euthymic  Affect:  Congruent  Thought Process:  Coherent and Descriptions of Associations: Intact  Orientation:  Full (Time, Place, and Person)  Thought Content:  Logical  Suicidal Thoughts:  No  Homicidal Thoughts:  No  Memory:  Immediate;   Fair Recent;   Fair Remote;   Fair  Judgement:  Intact  Insight:  Good  Psychomotor Activity:  Normal  Concentration:  Good  Recall:  Good  Fund of Knowledge:Good  Language: Good  Akathisia:  Negative  Handed:  Right  AIMS (if indicated):     Assets:  Communication Skills Desire for Improvement Housing Physical Health Resilience Social Support  Sleep:  Number of Hours: 6  Cognition: WNL  ADL's:  Intact   Mental Status Per Nursing Assessment::   On Admission:  NA  Demographic Factors:  Caucasian  Loss Factors: NA  Historical Factors: Impulsivity  Risk Reduction Factors:   Sense of responsibility to family, Living with another person,  especially a relative and Positive social support  Continued Clinical Symptoms:  Depression:   Comorbid alcohol abuse/dependence Impulsivity Alcohol/Substance Abuse/Dependencies  Cognitive Features That Contribute To Risk:  None    Suicide Risk:  Minimal: No identifiable suicidal ideation.  Patients presenting with no risk factors but with morbid ruminations; may be classified as minimal risk based on the severity of the depressive symptoms  Follow-up Information    [facility] Follow [facility].   Why:  Please walk in within 3 days of hospital discharge to be assessed for substance abuse intensive outpatient program/therapy. Walk in hours: Monday, Wednesday, and Friday bewteen 11:00AM-12:30PM. Thank you.  Contact information: [place], [place] [address] Phone: [phone] Fax: [phone]       [facility] Follow up on 04/30/2018.   Specialty:  Family Medicine Why:  Hospital follow-up with Dr. Sarajane Jews on Tuesday, 10/29 at 10:45AM. Please arrive 15 minutes early for check in.  Contact information: [place] [phone]          Plan Of Care/Follow-up recommendations:  Activity:  ad lib  Sharma Covert, MD 04/23/2018, 8:38 AM

## 2018-04-23 NOTE — Discharge Summary (Signed)
Physician Discharge Summary Note  Patient:  Kim Elliott is an 52 y.o., female MRN:  [id number] DOB:  [birth year] Patient phone:  [phone] (home)  Patient address:   [address] [place] [address],  Total Time spent with patient: 20 minutes  Date of Admission:  04/19/2018 Date of Discharge: 04/23/18  Reason for Admission:  Worsening depression with SI and ETOH abuse  Principal Problem: MDD (major depressive disorder), severe [facility]) Discharge Diagnoses: Patient Active Problem List   Diagnosis Date Noted  . Alcohol dependence ([place]) [F10.20] 04/21/2018  . Seasonal allergic rhinitis [J30.2] 04/21/2018  . MDD (major depressive disorder), severe ([place]) [F32.2] 04/19/2018  . Depression with anxiety [F41.8] 07/20/2017  . Closed fracture of left clavicle, midshaft [S42.002A] 05/05/2014    Past Psychiatric History: Depression, anxiety  Past Medical History:  Past Medical History:  Diagnosis Date  . Cancer ([place])   . Closed fracture of left clavicle 05/05/2014  . Medical history non-contributory   . Melanoma in situ of left lower leg [facility])     Past Surgical History:  Procedure Laterality Date  . BREAST SURGERY  1995   lt br bx-neg  . DILATION AND CURETTAGE OF UTERUS     x2post [place]  . MELANOMA EXCISION  2016   left lower leg  . ORIF CLAVICULAR FRACTURE Left 05/05/2014   Procedure: OPEN REDUCTION INTERNAL FIXATION (ORIF) LEFT  CLAVICULAR FRACTURE;  Surgeon: Johnny Bridge, MD;  Location: [facility];  Service: Orthopedics;  Laterality: Left;  Marland Kitchen VEIN LIGATION AND STRIPPING     legs   Family History: History reviewed. No pertinent family history. Family Psychiatric  History: Brother committed suicide Social History:  Social History   Substance and Sexual Activity  Alcohol Use Yes  . Alcohol/week: 21.0 standard drinks  . Types: 21 Glasses of wine per week   Comment: 3 glasses wine per day     Social History   Substance and Sexual Activity   Drug Use No    Social History   Socioeconomic History  . Marital status: Divorced    Spouse name: Not on file  . Number of children: Not on file  . Years of education: Not on file  . Highest education level: Not on file  Occupational History  . Not on file  Social Needs  . Financial resource strain: Not on file  . Food insecurity:    Worry: Not on file    Inability: Not on file  . Transportation needs:    Medical: Not on file    Non-medical: Not on file  Tobacco Use  . Smoking status: Former Smoker    Last attempt to quit: 04/30/1984    Years since quitting: 34.0  . Smokeless tobacco: Never Used  Substance and Sexual Activity  . Alcohol use: Yes    Alcohol/week: 21.0 standard drinks    Types: 21 Glasses of wine per week    Comment: 3 glasses wine per day  . Drug use: No  . Sexual activity: Yes  Lifestyle  . Physical activity:    Days per week: Not on file    Minutes per session: Not on file  . Stress: Not on file  Relationships  . Social connections:    Talks on phone: Not on file    Gets together: Not on file    Attends religious service: Not on file    Active member of club or organization: Not on file    Attends meetings of clubs or  organizations: Not on file    Relationship status: Not on file  Other Topics Concern  . Not on file  Social History Narrative  . Not on file    Hospital Course:   04/18/18 [facility] Counselor Assessment: 52 y.o. female who presents to the ED under IVC initiated by her daughter. According to the IVC, the respondent "indicated may kill herself in a text to her children earlier today. Drinking on average one bottle of wine per day and operating a motor vehicle while under influence. Verbally aggressive towards daughter telling her to kill herself and leave her alone." Pt is crying hysterically during the assessment and states that she did not tell her daughter she was suicidal. Pt denies SI, HI, and AVH to this Probation officer. Pt states her father  recently passed away which she claims is her only stressor. Pt states she does not want to hurt anyone and she wants to be d/c. Pt states she does not know who is going to care for her dogs while she is in the ED and continues to ask this Probation officer when she will be allowed to leave. Pt denies any prior psych history, however per chart review pt was recently evaluated at [facility] [place] on 02/26/18 due to depression and anxiety. Pt denies that she has an ongoing [facility] provider.   04/20/18 [place] MD Assessment: Patient was brought to [facility] ED under IVC by her daughter after she had reportedly sent a text indicating that she was going to commit suicide.  Patient reports that in 2007/10/14 her brother killed himself and at that time her mother never recuperated so she lost her mom mentally.  She then reports that her mom died 2 years ago.  In 2018-01-13 her dad was diagnosed with a rapid acting cancer and he died approximately 2 weeks ago.  In approximately a week and a half ago her son who is 39 was diagnosed with Crohn's disease.  She was living in [place] up until a week and a half ago and then came back to [place] to be with her son.  Patient also reports that she was working for her parents company, doing appraisals, and when she came back up here her stepmom contacted her and it was cutting her salary in half.  She was states that the salary went from approximately 60,000 a year down to somewhere between 20 and 30,000 a year which added another stressor to her.  Patient reports that ever since her brother died in October 14, 2007 she has been drinking and she deals with some anxiety and depression.  She had started Prozac in 10-14-2007 and in August 2019 the Prozac was increased to 40 mg.  On top of drinking almost daily she has also been taking Ativan to help with her anxiety.  She states that she does not remember sending any text to her daughter, but she was also intoxicated and it does scare her of what she has  been doing when she is intoxicated.  She admits to needing some assistance with alcohol and with her depression and anxiety.  Patient appears today to be having some tremors and some anxiety and her blood pressure and heart rate are both elevated and we will discussed with the RN about giving her the Librium for her CIWA score.  Patient remained on the [facility] unit for 3 days. The patient stabilized on medication and therapy. Patient was discharged on Prozac 40 mg Daily, Trazodone 50 mg QHS PRN,  Vistaril 50 mg Q6H PRN, and was started on Lisinopril 10 mg Daily for newly diagnosed HTN. Patient has shown improvement with improved mood, affect, sleep, appetite, and interaction. Patient has attended group and participated. Patient has been seen in the day room interacting with peers and staff appropriately. Patient denies any SI/HI/AVH and contracts for safety. Patient agrees to follow up at [facility] [place]. TSH is elevated and Free T4 is below normal and will request patient to follow up her PCP for further treatment. Patient is provided with prescriptions for their medications upon discharge.   Physical Findings: AIMS: Facial and Oral Movements Muscles of Facial Expression: None, normal Lips and Perioral Area: None, normal Jaw: None, normal Tongue: None, normal,Extremity Movements Upper (arms, wrists, hands, fingers): None, normal Lower (legs, knees, ankles, toes): None, normal, Trunk Movements Neck, shoulders, hips: None, normal, Overall Severity Severity of abnormal movements (highest score from questions above): None, normal Incapacitation due to abnormal movements: None, normal Patient's awareness of abnormal movements (rate only patient's report): No Awareness, Dental Status Current problems with teeth and/or dentures?: No Does patient usually wear dentures?: No  CIWA:  CIWA-Ar Total: 2 COWS:  COWS Total Score: 3  Musculoskeletal: Strength & Muscle Tone: within normal limits Gait  & Station: normal Patient leans: N/A  Psychiatric Specialty Exam: Physical Exam  Nursing note and vitals reviewed. Constitutional: She is oriented to person, place, and time. She appears well-developed and well-nourished.  Cardiovascular: Normal rate.  Respiratory: Effort normal.  Musculoskeletal: Normal range of motion.  Neurological: She is alert and oriented to person, place, and time.  Skin: Skin is warm.    Review of Systems  Constitutional: Negative.   HENT: Negative.   Eyes: Negative.   Respiratory: Negative.   Cardiovascular: Negative.   Gastrointestinal: Negative.   Genitourinary: Negative.   Musculoskeletal: Negative.   Skin: Negative.   Neurological: Negative.   Endo/Heme/Allergies: Negative.   Psychiatric/Behavioral: Negative.     Blood pressure 119/86, pulse 98, temperature 97.6 F (36.4 C), temperature source Oral, resp. rate 18, height 5\' 5"  (1.651 m), weight 72.6 kg, SpO2 98 %.Body mass index is 26.63 kg/m.  General Appearance: Casual  Eye Contact:  Good  Speech:  Clear and Coherent and Normal Rate  Volume:  Normal  Mood:  Euthymic  Affect:  Congruent  Thought Process:  Goal Directed and Descriptions of Associations: Intact  Orientation:  Full (Time, Place, and Person)  Thought Content:  WDL  Suicidal Thoughts:  No  Homicidal Thoughts:  No  Memory:  Immediate;   Good Recent;   Good Remote;   Good  Judgement:  Fair  Insight:  Good  Psychomotor Activity:  Normal  Concentration:  Concentration: Good and Attention Span: Good  Recall:  Good  Fund of Knowledge:  Good  Language:  Good  Akathisia:  No  Handed:  Right  AIMS (if indicated):     Assets:  Communication Skills Desire for Improvement Financial Resources/Insurance Housing Physical Health Social Support Transportation  ADL's:  Intact  Cognition:  WNL  Sleep:  Number of Hours: 6     Have you used any form of tobacco in the last 30 days? (Cigarettes, Smokeless Tobacco, Cigars, and/or  Pipes): No  Has this patient used any form of tobacco in the last 30 days? (Cigarettes, Smokeless Tobacco, Cigars, and/or Pipes) Yes, No  Blood Alcohol level:  Lab Results  Component Value Date   ETH 76 (H) 82/42/3536    Metabolic Disorder Labs:  No results found for: HGBA1C, MPG No results found for: PROLACTIN No results found for: CHOL, TRIG, HDL, CHOLHDL, VLDL, LDLCALC  See Psychiatric Specialty Exam and Suicide Risk Assessment completed by Attending Physician prior to discharge.  Discharge destination:  Home  Is patient on multiple antipsychotic therapies at discharge:  No   Has Patient had three or more failed trials of antipsychotic monotherapy by history:  No  Recommended Plan for Multiple Antipsychotic Therapies: NA   Allergies as of 04/23/2018      Reactions   Codeine Nausea And Vomiting   Oxycodone    hallucinations   Vicodin [hydrocodone-acetaminophen]    hallucinations      Medication List    STOP taking these medications   BIOTIN PO   erythromycin ophthalmic ointment   LORazepam 1 MG tablet Commonly known as:  ATIVAN     TAKE these medications     Indication  FLUoxetine 40 MG capsule Commonly known as:  PROZAC Take 1 capsule (40 mg total) by mouth daily.  Indication:  Major Depressive Disorder   hydrOXYzine 50 MG tablet Commonly known as:  ATARAX/VISTARIL Take 1 tablet (50 mg total) by mouth every 6 (six) hours as needed for anxiety.  Indication:  Feeling Anxious   lisinopril 10 MG tablet Commonly known as:  PRINIVIL,ZESTRIL Take 1 tablet (10 mg total) by mouth daily. For high blood pressure Start taking on:  04/24/2018  Indication:  High Blood Pressure Disorder   loratadine 10 MG tablet Commonly known as:  CLARITIN Take 10 mg by mouth daily as needed for allergies.  Indication:  Hayfever   multivitamin tablet Take 1 tablet by mouth daily.  Indication:  Per PCP   traZODone 50 MG tablet Commonly known as:  DESYREL Take 1 tablet (50  mg total) by mouth at bedtime as needed for sleep.  Indication:  Trouble Sleeping      Follow-up Information    [facility].   Why:  Please walk in within 3 days of hospital discharge to be assessed for substance abuse intensive outpatient program/therapy. Walk in hours: Monday, Wednesday, and Friday bewteen 11:00AM-12:30PM. Thank you.  Contact information: [place], [place] [address] Phone: [phone] Fax: [phone]       [facility] Follow up on 04/30/2018.   Specialty:  Family Medicine Why:  Hospital follow-up with Dr. Sarajane Jews on Tuesday, 10/29 at 10:45AM. Please arrive 15 minutes early for check in.  Contact information: [place] [phone]          Follow-up recommendations:  Continue activity as tolerated. Continue diet as recommended by your PCP. Ensure to keep all appointments with outpatient providers.  Comments:  Patient is instructed prior to discharge to: Take all medications as prescribed by his/her mental healthcare provider. Report any adverse effects and or reactions from the medicines to his/her outpatient provider promptly. Patient has been instructed & cautioned: To not engage in alcohol and or illegal drug use while on prescription medicines. In the event of worsening symptoms, patient is instructed to call the crisis hotline, 911 and or go to the nearest ED for appropriate evaluation and treatment of symptoms. To follow-up with his/her primary care provider for your other medical issues, concerns and or health care needs.    Signed: [place], FNP 04/23/2018, 8:43 AM

## 2018-04-25 ENCOUNTER — Telehealth: Payer: Self-pay

## 2018-04-25 NOTE — Telephone Encounter (Signed)
Left message for TCM call. CRM created.

## 2018-04-25 NOTE — Telephone Encounter (Addendum)
Transition Care Management Follow-up Telephone Call   Date discharged? 04/23/18   How have you been since you were released from the hospital? " I was put there involentary. I've been going to meetings and I sill have moments of very nerousness."   Do you understand why you were in the hospital? yes   Do you understand the discharge instructions? yes   Where were you discharged to? home   Items Reviewed:  Medications reviewed: yes, took off lorazapam, stop OTC biotin  Allergies reviewed: yes  Dietary changes reviewed: yes, none  Referrals reviewed: no   Functional Questionnaire:   Activities of Daily Living (ADLs):   She states they are independent in the following: ambulation, bathing and hygiene, feeding, continence, grooming, toileting and dressing States they require assistance with the following: none   Any transportation issues/concerns?: no   Any patient concerns? yes, "my thyroid came back and was high but I wanted to wait and see Dr. Sarajane Jews"   Confirmed importance and date/time of follow-up visits scheduled yes, 04/29/18  Provider Appointment booked with Dr. Sarajane Jews  Confirmed with patient if condition begins to worsen call PCP or go to the ER.  Patient was given the office number and encouraged to call back with question or concerns.  : yes

## 2018-04-29 ENCOUNTER — Encounter: Payer: Self-pay | Admitting: Family Medicine

## 2018-04-29 ENCOUNTER — Ambulatory Visit (INDEPENDENT_AMBULATORY_CARE_PROVIDER_SITE_OTHER): Payer: Self-pay | Admitting: Family Medicine

## 2018-04-29 VITALS — BP 90/62 | HR 67 | Temp 98.0°F | Wt 160.3 lb

## 2018-04-29 DIAGNOSIS — F1021 Alcohol dependence, in remission: Secondary | ICD-10-CM

## 2018-04-29 DIAGNOSIS — F322 Major depressive disorder, single episode, severe without psychotic features: Secondary | ICD-10-CM

## 2018-04-29 MED ORDER — TRAZODONE HCL 100 MG PO TABS: 100.0000 mg | ORAL_TABLET | Freq: Every day | ORAL | 2 refills | Status: DC

## 2018-04-29 MED ORDER — HYDROXYZINE HCL 50 MG PO TABS: 50.0000 mg | ORAL_TABLET | Freq: Four times a day (QID) | ORAL | 2 refills | Status: DC | PRN

## 2018-04-29 NOTE — Progress Notes (Signed)
   Subjective:    Patient ID: Kim Elliott, female    DOB: Mar 08, 1966, 52 y.o.   MRN: [id number]  HPI Here to follow up an IVC stay from 04-19-18 to 04-23-18 for suicidal ideation, depression, and alcohol abuse. She has had a lot of things go wrong in her life the past few weeks, including her son being hospitalized twice with Crohns disease and her father passing away. She sent a suicidal text to her daughter, who then contacted 911. She admitted to drinking a bottle of wine a day and to feeling very depressed, but she she denied wanting to hurt herself. She was discharged on her usual Prozac 40 mg daily, and they added Hydroxyzine to take for anxiety (instead of Lorazepam) and Trazodone 50 mg for sleep. She has been feeling better since going home, but she needs a little more help with anxiety. She has stopped drinking and she has been attending [place] meetings. She has also attended a [facility]rief [facility] Loss Group and [facility], and she has a Adult nurse person to talk to. She has been spending time in the hospital with her son.   Review of Systems  Constitutional: Negative.   Respiratory: Negative.   Cardiovascular: Negative.   Neurological: Negative.   Psychiatric/Behavioral: Positive for dysphoric mood and sleep disturbance. Negative for agitation, confusion, hallucinations, self-injury and suicidal ideas. The patient is nervous/anxious.        Objective:   Physical Exam  Constitutional: She is oriented to person, place, and time. She appears well-developed and well-nourished.  Cardiovascular: Normal rate, regular rhythm, normal heart sounds and intact distal pulses.  Pulmonary/Chest: Effort normal and breath sounds normal.  Neurological: She is alert and oriented to person, place, and time.  Psychiatric: She has a normal mood and affect. Her behavior is normal. Judgment and thought content normal.          Assessment & Plan:  She is doing well, and her depression and anxiety are  improving. She has stopped using alcohol and she will continue to attend AA meetings. I encouraged her to attend her other support groups as well. We will increase the Trazodone to 100 mg qhs. She was started on Lisinopril in IVC, but her BP has been low since she got home. She does not have HTN and we will stop the Lisiniopril. Recheck all these issues in one month.  Alysia Penna, MD

## 2018-04-30 ENCOUNTER — Inpatient Hospital Stay: Payer: Self-pay | Admitting: Family Medicine

## 2018-05-22 ENCOUNTER — Other Ambulatory Visit: Payer: Self-pay | Admitting: Family Medicine

## 2018-06-05 ENCOUNTER — Encounter: Payer: Self-pay | Admitting: Family Medicine

## 2018-06-05 ENCOUNTER — Ambulatory Visit: Payer: Self-pay | Admitting: Family Medicine

## 2018-06-05 VITALS — BP 124/86 | HR 81 | Temp 97.9°F | Wt 169.4 lb

## 2018-06-06 ENCOUNTER — Encounter: Payer: Self-pay | Admitting: Family Medicine

## 2018-06-06 ENCOUNTER — Ambulatory Visit (INDEPENDENT_AMBULATORY_CARE_PROVIDER_SITE_OTHER): Payer: Self-pay | Admitting: Family Medicine

## 2018-06-06 VITALS — BP 124/86 | HR 81 | Temp 97.9°F | Wt 169.4 lb

## 2018-06-06 DIAGNOSIS — F1021 Alcohol dependence, in remission: Secondary | ICD-10-CM

## 2018-06-06 DIAGNOSIS — Z23 Encounter for immunization: Secondary | ICD-10-CM

## 2018-06-06 DIAGNOSIS — F418 Other specified anxiety disorders: Secondary | ICD-10-CM

## 2018-06-06 MED ORDER — TRAZODONE HCL 100 MG PO TABS: 200.0000 mg | ORAL_TABLET | Freq: Every day | ORAL | 5 refills | Status: DC

## 2018-06-06 NOTE — Progress Notes (Signed)
   Subjective:    Patient ID: Kim Elliott, female    DOB: 27-May-1966, 52 y.o.   MRN: [id number]  HPI Here to follow up on depression with anxiety and alcohol abuse. Since our last visit she has been feeling a little better. She is taking Prozac 40 mg daily and she takes Hydroxyzine 4 times a day for anxiety. We increased the dose of her Trazodone to 100 mg qhs, but she still has trouble sleeping. She has attended a few [facility] meetings, but she is going to her [facility] meetings and her grief group meetings regularly.    Review of Systems  Constitutional: Negative.   Respiratory: Negative.   Cardiovascular: Negative.   Neurological: Negative.   Psychiatric/Behavioral: Positive for dysphoric mood and sleep disturbance. Negative for agitation, behavioral problems, self-injury and suicidal ideas. The patient is nervous/anxious.        Objective:   Physical Exam  Constitutional: She is oriented to person, place, and time. She appears well-developed and well-nourished.  Cardiovascular: Normal rate, regular rhythm, normal heart sounds and intact distal pulses.  Pulmonary/Chest: Effort normal and breath sounds normal.  Neurological: She is alert and oriented to person, place, and time.  Psychiatric: She has a normal mood and affect. Her behavior is normal. Thought content normal.  She is neatly dressed and seems more relaxed today           Assessment & Plan:  Her depression and anxiety are improving, but sleep is still a problem. We will increase the Trazodone to 200 mg qhs. She has remained sober.  Alysia Penna, MD

## 2018-06-06 NOTE — Progress Notes (Signed)
error 

## 2018-06-21 ENCOUNTER — Other Ambulatory Visit: Payer: Self-pay | Admitting: Family Medicine

## 2018-07-17 ENCOUNTER — Encounter: Payer: Self-pay | Admitting: Obstetrics and Gynecology

## 2018-07-25 ENCOUNTER — Other Ambulatory Visit: Payer: Self-pay | Admitting: Family Medicine

## 2018-08-02 ENCOUNTER — Other Ambulatory Visit: Payer: Self-pay

## 2018-08-02 ENCOUNTER — Ambulatory Visit (INDEPENDENT_AMBULATORY_CARE_PROVIDER_SITE_OTHER): Payer: Self-pay | Admitting: Obstetrics and Gynecology

## 2018-08-02 ENCOUNTER — Other Ambulatory Visit (HOSPITAL_COMMUNITY)
Admission: RE | Admit: 2018-08-02 | Discharge: 2018-08-02 | Disposition: A | Discharge status: Home / Self Care | Payer: Self-pay | Source: Ambulatory Visit | Attending: Obstetrics and Gynecology | Admitting: Obstetrics and Gynecology

## 2018-08-02 ENCOUNTER — Encounter: Payer: Self-pay | Admitting: Obstetrics and Gynecology

## 2018-08-02 VITALS — BP 128/78 | HR 72 | Resp 14 | Ht 65.0 in | Wt 167.0 lb

## 2018-08-02 DIAGNOSIS — R232 Flushing: Secondary | ICD-10-CM

## 2018-08-02 DIAGNOSIS — N920 Excessive and frequent menstruation with regular cycle: Secondary | ICD-10-CM

## 2018-08-02 DIAGNOSIS — Z01419 Encounter for gynecological examination (general) (routine) without abnormal findings: Secondary | ICD-10-CM

## 2018-08-02 DIAGNOSIS — R7989 Other specified abnormal findings of blood chemistry: Secondary | ICD-10-CM

## 2018-08-02 DIAGNOSIS — Z124 Encounter for screening for malignant neoplasm of cervix: Secondary | ICD-10-CM | POA: Insufficient documentation

## 2018-08-02 DIAGNOSIS — N898 Other specified noninflammatory disorders of vagina: Secondary | ICD-10-CM

## 2018-08-02 DIAGNOSIS — Z1211 Encounter for screening for malignant neoplasm of colon: Secondary | ICD-10-CM

## 2018-08-02 NOTE — Progress Notes (Signed)
53 y.o. G7P3 Divorced White or Caucasian Not Hispanic or Latino female here for annual exam.   Having a hard time with hot flashes and night sweats, mainly at night. Up 3-4 x a night, needs to change her clothes. She does notices some vaginal dryness, burning. Occasional odor. Not sexually active.  She has had a stressful year. Takes trazodone for sleep.  Period Cycle (Days): 28 Period Duration (Days): 4-6 Period Pattern: Regular Menstrual Flow: Heavy Menstrual Control: Maxi pad Menstrual Control Change Freq (Hours): changes saturated pad every 15 minutes on heaviest day  Dysmenorrhea: (!) Moderate Dysmenorrhea Symptoms: Cramping, Other (Comment)(back pain )  Cycles have been very heavy for the last year. Prior to that at most she was changing a pad every 1-2 hours.  She wasn't anemic in 10/19. Her TSH was slightly elevated, FT4 minimally low.   Patient's last menstrual period was 07/25/2018.          Sexually active: No.  The current method of family planning is none.    Exercising: Yes.    walking Smoker:  Former smoker  Health Maintenance: Pap:  ~4 years ago at [facility] History of abnormal Pap:  yes MMG:  never BMD:   never Colonoscopy: never TDaP:  2019  Gardasil: n/a   reports that she quit smoking about 34 years ago. She has never used smokeless tobacco. She reports current alcohol use of about 21.0 standard drinks of alcohol per week. She reports that she does not use drugs. Drinking 4-5 glasses of wine a week, has cut back. She is currently looking for work. Her Dad died last year, son then diagnosed with Crohn's. Lost her job. Very stressful year. Kids are 2, 22 and 21. Betsy Coder is the one with Crohn's, he is in college.    Past Medical History:  Diagnosis Date  . Anxiety   . Cancer ([place])   . Closed fracture of left clavicle 05/05/2014  . Depression   . Dysmenorrhea   . Medical history non-contributory   . Melanoma in situ of left lower leg [facility])      Past Surgical History:  Procedure Laterality Date  . BREAST SURGERY  1995   lt br [id number]  . COLPOSCOPY    . DILATION AND CURETTAGE OF UTERUS     x2post misscarage  . MELANOMA EXCISION  2016   left lower leg  . ORIF CLAVICULAR FRACTURE Left 05/05/2014   Procedure: OPEN REDUCTION INTERNAL FIXATION (ORIF) LEFT  CLAVICULAR FRACTURE;  Surgeon: Johnny Bridge, MD;  Location: [facility];  Service: Orthopedics;  Laterality: Left;  Marland Kitchen VEIN LIGATION AND STRIPPING     legs    Current Outpatient Medications  Medication Sig Dispense Refill  . Biotin w/ Vitamins C & E (HAIR/SKIN/NAILS PO) Take by mouth.    . Chlorpheniramine Maleate (ALLERGY PO) Take by mouth.    . EVENING PRIMROSE OIL PO Take by mouth.    Marland Kitchen FLUoxetine (PROZAC) 40 MG capsule Take 1 capsule (40 mg total) by mouth daily. 90 capsule 3  . hydrOXYzine (ATARAX/VISTARIL) 50 MG tablet TAKE 1 TABLET (50 MG TOTAL) BY MOUTH EVERY 6 (SIX) HOURS AS NEEDED FOR ANXIETY. 60 tablet 2  . NON FORMULARY Menopause relief po    . traZODone (DESYREL) 100 MG tablet Take 2 tablets (200 mg total) by mouth at bedtime. 60 tablet 5   No current facility-administered medications for this visit.     Family History  Problem Relation Age of Onset  .  Hypertension Father     Review of Systems  Constitutional: Negative.   HENT: Negative.   Eyes: Negative.   Respiratory: Negative.   Cardiovascular: Negative.   Gastrointestinal: Negative.   Endocrine: Positive for heat intolerance.       Hot flashes  Genitourinary: Positive for menstrual problem and vaginal bleeding.       Vaginal odor  Vaginal burning   Musculoskeletal: Negative.   Allergic/Immunologic: Negative.   Neurological: Negative.   Hematological: Negative.   Psychiatric/Behavioral: Negative.     Exam:   BP 128/78 (BP Location: Right Arm, Patient Position: Sitting, Cuff Size: Normal)   Pulse 72   Resp 14   Ht 5\' 5"  (1.651 m)   Wt 167 lb (75.8 kg)   LMP 07/25/2018    BMI 27.79 kg/m   Weight change: @WEIGHTCHANGE @ Height:   Height: 5\' 5"  (165.1 cm)  Ht Readings from Last 3 Encounters:  08/02/18 5\' 5"  (1.651 m)  02/26/18 5\' 5"  (1.651 m)  07/17/16 5\' 5"  (1.651 m)    General appearance: alert, cooperative and appears stated age Head: Normocephalic, without obvious abnormality, atraumatic Neck: no adenopathy, supple, symmetrical, trachea midline and thyroid normal to inspection and palpation Lungs: clear to auscultation bilaterally Cardiovascular: regular rate and rhythm Breasts: normal appearance, no masses or tenderness Abdomen: soft, non-tender; non distended,  no masses,  no organomegaly Extremities: extremities normal, atraumatic, no cyanosis or edema Skin: Skin color, texture, turgor normal. No rashes or lesions Lymph nodes: Cervical, supraclavicular, and axillary nodes normal. No abnormal inguinal nodes palpated Neurologic: Grossly normal   Pelvic: External genitalia:  no lesions              Urethra:  normal appearing urethra with no masses, tenderness or lesions              Bartholins and Skenes: normal                 Vagina: mildly atrophic appearing vagina with normal color and discharge, no lesions              Cervix: no lesions               Bimanual Exam:  Uterus:  uterus anteverted, mobile, top normal sized, slightly irregular, not tender              Adnexa: no mass, fullness, tenderness               Rectovaginal: Confirms               Anus:  normal sphincter tone, no lesions  Chaperone was present for exam.  A:  Well Woman with normal exam  Menorrhagia, previously not anemic, but had mildly abnormal TFT's. Uterus top normal sized, slightly irregular. She reports being told she had fibroids in the past  Vaginal odor  Vaginal dryness/burning, mildly atrophic  Hot flashes at night with night sweats. On prozac, not interested in gabapentin. Discussed the option of low dose OCP's, reviewed risks. She would need a mammogram  first      P:   Mammogram due  Pap with hpv, check for BV  Discussed vaginal moisturizers, Vaseline externally  TFT's, CBC, Ferritin   IFOB  Other labs with primary  Would recommend ultrasound.   Discussed breast self exam  Discussed calcium and vit D intake  She is without insurance, will get the mammogram when she can. Will call if she wants to start OCP's  If she is  anemic or if her bleeding doesn't improve on OCP's she needs further evaluation with ultrasound/biopsy

## 2018-08-02 NOTE — Patient Instructions (Addendum)
EXERCISE AND DIET:  We recommended that you start or continue a regular exercise program for good health. Regular exercise means any activity that makes your heart beat faster and makes you sweat.  We recommend exercising at least 30 minutes per day at least 3 days a week, preferably 4 or 5.  We also recommend a diet low in fat and sugar.  Inactivity, poor dietary choices and obesity can cause diabetes, heart attack, stroke, and kidney damage, among others.    ALCOHOL AND SMOKING:  Women should limit their alcohol intake to no more than 7 drinks/beers/glasses of wine (combined, not each!) per week. Moderation of alcohol intake to this level decreases your risk of breast cancer and liver damage. And of course, no recreational drugs are part of a healthy lifestyle.  And absolutely no smoking or even second hand smoke. Most people know smoking can cause heart and lung diseases, but did you know it also contributes to weakening of your bones? Aging of your skin?  Yellowing of your teeth and nails?  CALCIUM AND VITAMIN D:  Adequate intake of calcium and Vitamin D are recommended.  The recommendations for exact amounts of these supplements seem to change often, but generally speaking 1,000 mg of calcium (between diet and supplement) and 800 units of Vitamin D per day seems prudent. Certain women may benefit from higher intake of Vitamin D.  If you are among these women, your doctor will have told you during your visit.    PAP SMEARS:  Pap smears, to check for cervical cancer or precancers,  have traditionally been done yearly, although recent scientific advances have shown that most women can have pap smears less often.  However, every woman still should have a physical exam from her gynecologist every year. It will include a breast check, inspection of the vulva and vagina to check for abnormal growths or skin changes, a visual exam of the cervix, and then an exam to evaluate the size and shape of the uterus and  ovaries.  And after 52 years of age, a rectal exam is indicated to check for rectal cancers. We will also provide age appropriate advice regarding health maintenance, like when you should have certain vaccines, screening for sexually transmitted diseases, bone density testing, colonoscopy, mammograms, etc.   MAMMOGRAMS:  All women over 40 years old should have a yearly mammogram. Many facilities now offer a "3D" mammogram, which may cost around $50 extra out of pocket. If possible,  we recommend you accept the option to have the 3D mammogram performed.  It both reduces the number of women who will be called back for extra views which then turn out to be normal, and it is better than the routine mammogram at detecting truly abnormal areas.    COLON CANCER SCREENING: Now recommend starting at age 45. At this time colonoscopy is not covered for routine screening until 50. There are take home tests that can be done between 45-49.   COLONOSCOPY:  Colonoscopy to screen for colon cancer is recommended for all women at age 50.  We know, you hate the idea of the prep.  We agree, BUT, having colon cancer and not knowing it is worse!!  Colon cancer so often starts as a polyp that can be seen and removed at colonscopy, which can quite literally save your life!  And if your first colonoscopy is normal and you have no family history of colon cancer, most women don't have to have it again for   10 years.  Once every ten years, you can do something that may end up saving your life, right?  We will be happy to help you get it scheduled when you are ready.  Be sure to check your insurance coverage so you understand how much it will cost.  It may be covered as a preventative service at no cost, but you should check your particular policy.      Breast Self-Awareness Breast self-awareness means being familiar with how your breasts look and feel. It involves checking your breasts regularly and reporting any changes to your  health care provider. Practicing breast self-awareness is important. A change in your breasts can be a sign of a serious medical problem. Being familiar with how your breasts look and feel allows you to find any problems early, when treatment is more likely to be successful. All women should practice breast self-awareness, including women who have had breast implants. How to do a breast self-exam One way to learn what is normal for your breasts and whether your breasts are changing is to do a breast self-exam. To do a breast self-exam: Look for Changes  1. Remove all the clothing above your waist. 2. Stand in front of a mirror in a room with good lighting. 3. Put your hands on your hips. 4. Push your hands firmly downward. 5. Compare your breasts in the mirror. Look for differences between them (asymmetry), such as: ? Differences in shape. ? Differences in size. ? Puckers, dips, and bumps in one breast and not the other. 6. Look at each breast for changes in your skin, such as: ? Redness. ? Scaly areas. 7. Look for changes in your nipples, such as: ? Discharge. ? Bleeding. ? Dimpling. ? Redness. ? A change in position. Feel for Changes Carefully feel your breasts for lumps and changes. It is best to do this while lying on your back on the floor and again while sitting or standing in the shower or tub with soapy water on your skin. Feel each breast in the following way:  Place the arm on the side of the breast you are examining above your head.  Feel your breast with the other hand.  Start in the nipple area and make  inch (2 cm) overlapping circles to feel your breast. Use the pads of your three middle fingers to do this. Apply light pressure, then medium pressure, then firm pressure. The light pressure will allow you to feel the tissue closest to the skin. The medium pressure will allow you to feel the tissue that is a little deeper. The firm pressure will allow you to feel the tissue  close to the ribs.  Continue the overlapping circles, moving downward over the breast until you feel your ribs below your breast.  Move one finger-width toward the center of the body. Continue to use the  inch (2 cm) overlapping circles to feel your breast as you move slowly up toward your collarbone.  Continue the up and down exam using all three pressures until you reach your armpit.  Write Down What You Find  Write down what is normal for each breast and any changes that you find. Keep a written record with breast changes or normal findings for each breast. By writing this information down, you do not need to depend only on memory for size, tenderness, or location. Write down where you are in your menstrual cycle, if you are still menstruating. If you are having trouble noticing differences   in your breasts, do not get discouraged. With time you will become more familiar with the variations in your breasts and more comfortable with the exam. How often should I examine my breasts? Examine your breasts every month. If you are breastfeeding, the best time to examine your breasts is after a feeding or after using a breast pump. If you menstruate, the best time to examine your breasts is 5-7 days after your period is over. During your period, your breasts are lumpier, and it may be more difficult to notice changes. When should I see my health care provider? See your health care provider if you notice:  A change in shape or size of your breasts or nipples.  A change in the skin of your breast or nipples, such as a reddened or scaly area.  Unusual discharge from your nipples.  A lump or thick area that was not there before.  Pain in your breasts.  Anything that concerns you.  Oral Contraception Information Oral contraceptive pills (OCPs) are medicines taken to prevent pregnancy. OCPs are taken by mouth, and they work by:  Preventing the ovaries from releasing eggs.  Thickening mucus in  the lower part of the uterus (cervix), which prevents sperm from entering the uterus.  Thinning the lining of the uterus (endometrium), which prevents a fertilized egg from attaching to the endometrium. OCPs are highly effective when taken exactly as prescribed. However, OCPs do not prevent STIs (sexually transmitted infections). Safe sex practices, such as using condoms while on an OCP, can help prevent STIs. Before starting OCPs Before you start taking OCPs, you may have a physical exam, blood test, and Pap test. However, you are not required to have a pelvic exam in order to be prescribed OCPs. Your health care provider will make sure you are a good candidate for oral contraception. OCPs are not a good option for certain women, including women who smoke and are older than 35 years, and women with a medical history of high blood pressure, deep vein thrombosis, pulmonary embolism, stroke, cardiovascular disease, or peripheral vascular disease. Discuss with your health care provider the possible side effects of the OCP you may be prescribed. When you start an OCP, be aware that it can take 2-3 months for your body to adjust to changes in hormone levels. Follow instructions from your health care provider about how to start taking your first cycle of OCPs. Depending on when you start the pill, you may need to use a backup form of birth control, such as condoms, during the first week. Make sure you know what steps to take if you ever forget to take the pill. Types of oral contraception  The most common types of birth control pills contain the hormones estrogen and progestin (synthetic progesterone) or progestin only. The combination pill This type of pill contains estrogen and progestin hormones. Combination pills often come in packs of 21, 28, or 91 pills. For each pack, the last 7 pills may not contain hormones, which means you may stop taking the pills for 7 days. Menstrual bleeding occurs during the  week that you do not take the pills or that you take the pills with no hormones in them. The minipill This type of pill contains the progestin hormone only. It comes in packs of 28 pills. All 28 pills contain the hormone. You take the pill every day. It is very important to take the pill at the same time each day. Advantages of oral contraceptive pills    Provides reliable and continuous contraception if taken as instructed.  May treat or decrease symptoms of: ? Menstrual period cramps. ? Irregular menstrual cycle or bleeding. ? Heavy menstrual flow. ? Abnormal uterine bleeding. ? Acne, depending on the type of pill. ? Polycystic ovarian syndrome. ? Endometriosis. ? Iron deficiency anemia. ? Premenstrual symptoms, including premenstrual dysphoric disorder.  May reduce the risk of endometrial and ovarian cancer.  Can be used as emergency contraception.  Prevents mislocated (ectopic) pregnancies and infections of the fallopian tubes. Things that can make oral contraceptive pills less effective OCPs can be less effective if:  You forget to take the pill at the same time every day. This is especially important when taking the minipill.  You have a stomach or intestinal disease that reduces your body's ability to absorb the pill.  You take OCPs with other medicines that make OCPs less effective, such as antibiotics, certain HIV medicines, and some seizure medicines.  You take expired OCPs.  You forget to restart the pill on day 7, if using the packs of 21 pills. Risks associated with oral contraceptive pills Oral contraceptive pills can sometimes cause side effects, such as:  Headache.  Depression.  Trouble sleeping.  Nausea and vomiting.  Breast tenderness.  Irregular bleeding or spotting during the first several months.  Bloating or fluid retention.  Increase in blood pressure. Combination pills are also associated with a small increase in the risk of:  Blood  clots.  Heart attack.  Stroke. Summary  Oral contraceptive pills are medicines taken by mouth to prevent pregnancy. They are highly effective when taken exactly as prescribed.  The most common types of birth control pills contain the hormones estrogen and progestin (synthetic progesterone) or progestin only.  Before you start taking the pill, you may have a physical exam, blood test, and Pap test. Your health care provider will make sure you are a good candidate for oral contraception.  The combination pill may come in a 21-day pack, a 28-day pack, or a 91-day pack. The minipill contains the progesterone hormone only and comes in packs of 28 pills.  Oral contraceptive pills can sometimes cause side effects, such as headache, nausea, breast tenderness, or irregular bleeding. This information is not intended to replace advice given to you by your health care provider. Make sure you discuss any questions you have with your health care provider. Document Released: 09/09/2002 Document Revised: 09/12/2016 Document Reviewed: 09/12/2016 Elsevier Interactive Patient Education  2019 [place]  Yearly screening mammograms are recommended for women beginning at age 76. For a routine screening mammogram, you may schedule the appointment and have it done at the location of your choice.  Please ask the facility to send the results to our office. (fax [phone]) Location options include:  *[facility], [place], [place] [address] [place] [address], [place],  [address] [phone]

## 2018-08-03 LAB — CBC
HEMATOCRIT: 35.2 % (ref 34.0–46.6)
Hemoglobin: 11.6 g/dL (ref 11.1–15.9)
MCH: 28.6 pg (ref 26.6–33.0)
MCHC: 33 g/dL (ref 31.5–35.7)
MCV: 87 fL (ref 79–97)
PLATELETS: 244 10*3/uL (ref 150–450)
RBC: 4.06 x10E6/uL (ref 3.77–5.28)
RDW: 13.8 % (ref 11.7–15.4)
WBC: 6.2 10*3/uL (ref 3.4–10.8)

## 2018-08-03 LAB — THYROID PANEL WITH TSH
Free Thyroxine Index: 1.3 (ref 1.2–4.9)
T3 Uptake Ratio: 19 % — ABNORMAL LOW (ref 24–39)
T4 TOTAL: 6.9 ug/dL (ref 4.5–12.0)
TSH: 3.01 u[IU]/mL (ref 0.450–4.500)

## 2018-08-03 LAB — FERRITIN: Ferritin: 9 ng/mL — ABNORMAL LOW (ref 15–150)

## 2018-08-05 ENCOUNTER — Telehealth: Payer: Self-pay

## 2018-08-05 NOTE — Telephone Encounter (Signed)
-----   Message from Kim Dom, MD sent at 08/04/2018 12:52 PM EST ----- Please inform the patient that she is still not anemic, but she does have low iron stores. Her TFT's have improved. I would recommend that she take a daily iron supplement.   CC: Dr Sarajane Jews

## 2018-08-05 NOTE — Telephone Encounter (Signed)
Left message to call Satonya Lux at [phone]. 

## 2018-08-06 LAB — CYTOLOGY - PAP
BACTERIAL VAGINITIS: NEGATIVE
DIAGNOSIS: UNDETERMINED — AB
HPV (WINDOPATH): DETECTED — AB

## 2018-08-08 ENCOUNTER — Other Ambulatory Visit: Payer: Self-pay | Admitting: *Deleted

## 2018-08-08 DIAGNOSIS — R87810 Cervical high risk human papillomavirus (HPV) DNA test positive: Principal | ICD-10-CM

## 2018-08-08 DIAGNOSIS — R87610 Atypical squamous cells of undetermined significance on cytologic smear of cervix (ASC-US): Secondary | ICD-10-CM

## 2018-08-11 ENCOUNTER — Other Ambulatory Visit: Payer: Self-pay | Admitting: Family Medicine

## 2018-08-20 NOTE — Telephone Encounter (Signed)
Notes recorded by Archie Balboa, CMA on 08/06/2018 at 8:07 AM EST The following results were discussed with patient. ------

## 2018-09-10 ENCOUNTER — Telehealth: Payer: Self-pay | Admitting: *Deleted

## 2018-09-10 NOTE — Telephone Encounter (Signed)
Routing to [employer] and [employer] .   Encounter closed.

## 2018-09-10 NOTE — Telephone Encounter (Signed)
Patient returned call to nurse Jill. °

## 2018-09-10 NOTE — Telephone Encounter (Signed)
Notes recorded by Burnice Logan, RN on 09/10/2018 at 9:51 AM EDT Left message to call Sharee Pimple, RN at [place].   ------  Notes recorded by Burnice Logan, RN on 08/08/2018 at 11:23 AM EST Spoke with patient, advised as seen below per Dr. Talbert Nan. Patient familiar with colpo, questions answered. LMP 07/25/18. No contraceptive. Advised patient to return call to office on first day of menses to schedule colpo. Order placed for precert. Patient verbalizes understanding and is agreeable.   ------  Notes recorded by Salvadore Dom, MD on 08/06/2018 at 5:37 PM EST Please inform ASCUS/+HPV and set her up for a colposcopy. Negative screen for BV

## 2018-09-10 NOTE — Telephone Encounter (Signed)
Yes, fine to schedule colposcopy

## 2018-09-10 NOTE — Telephone Encounter (Signed)
Spoke with patient. Called to f/u with menses for  colpo. LMP 07/25/18, no menses to date per patient. Has not taken UPT. States not SA, "not been SA in months". Patient request to proceed with scheduling colpo.   Colpo scheduled for 09/25/18 at 10am, advised to continue to abstain from intercourse until colpo completed, will need UPT day of procedure. Advised to return call to office should menses start and flow is heavy prior to colpo. Advised I will review with Dr. Talbert Nan and return call if any additional recommendations. Advised to take Motrin 800 mg with food and water one hour before procedure. Patient verbalizes understadning.   Dr. Talbert Nan -please review. Ok to proceed with colpo?

## 2018-09-14 ENCOUNTER — Other Ambulatory Visit: Payer: Self-pay | Admitting: Family Medicine

## 2018-09-24 ENCOUNTER — Telehealth: Payer: Self-pay | Admitting: Obstetrics and Gynecology

## 2018-09-24 NOTE — Telephone Encounter (Signed)
Patient canceled her colposcopy tomorrow. She would like to reschedule.

## 2018-09-25 ENCOUNTER — Ambulatory Visit: Payer: Self-pay | Admitting: Obstetrics and Gynecology

## 2018-09-25 NOTE — Telephone Encounter (Signed)
Spoke with patient. Patient states that she has a son with Crohns and is trying to keep him as safe as possible during Covid 19 state of emergency. Wants to reschedule colpo. Patient is not sexually active at this time.  Instructions given. Motrin 800 mg po x , one hour before appointment with food. Make sure to eat a meal before appointment and drink plenty of fluids. Patient verbalized understanding and will call to reschedule if will be on menses or has any concerns regarding pregnancy. Advised UPT will be needed the day of procedure. Appointment moved to 10/24/2018 at 9 am with Dr.Jertson. Patient is agreeable to date and time.  Routing to provider and will close encounter.

## 2018-09-29 ENCOUNTER — Other Ambulatory Visit: Payer: Self-pay | Admitting: Family Medicine

## 2018-10-13 ENCOUNTER — Other Ambulatory Visit: Payer: Self-pay | Admitting: Family Medicine

## 2018-10-15 NOTE — Telephone Encounter (Signed)
Dr. Fry please advise on refills thanks  

## 2018-10-23 NOTE — Progress Notes (Signed)
GYNECOLOGY  VISIT   HPI: 53 y.o.   Divorced White or Caucasian Not Hispanic or Latino  female   G47P3 with Patient's last menstrual period was 10/17/2018 (exact date).   here for colposcopy. 08-02-2018 ASCUS HPV HR+, previous abnormal history She had a abnormal pap 5-6 years ago, had a colposcopy, no cervical surgery, normal f/u.  upt-neg   . GYNECOLOGIC HISTORY: Patient's last menstrual period was 10/17/2018 (exact date). Contraception:none abstinence Menopausal hormone therapy: none        OB History    Gravida  5   Para  3   Term      Preterm      AB      Living  3     SAB      TAB      Ectopic      Multiple      Live Births                 Patient Active Problem List   Diagnosis Date Noted  . Alcohol dependence ([place]) 04/21/2018  . Seasonal allergic rhinitis 04/21/2018  . MDD (major depressive disorder), severe ([place]) 04/19/2018  . Depression with anxiety 07/20/2017  . Closed fracture of left clavicle, midshaft 05/05/2014    Past Medical History:  Diagnosis Date  . Abnormal Pap smear of cervix   . Anxiety   . Cancer ([place])   . Closed fracture of left clavicle 05/05/2014  . Depression   . Dysmenorrhea   . Medical history non-contributory   . Melanoma in situ of left lower leg ([place])   . Migraine without aura     Past Surgical History:  Procedure Laterality Date  . BREAST SURGERY  1995   lt br bx-neg  . COLPOSCOPY    . DILATION AND CURETTAGE OF UTERUS     x2post misscarage  . MELANOMA EXCISION  2016   left lower leg  . ORIF CLAVICULAR FRACTURE Left 05/05/2014   Procedure: OPEN REDUCTION INTERNAL FIXATION (ORIF) LEFT  CLAVICULAR FRACTURE;  Surgeon: Johnny Bridge, MD;  Location: [facility];  Service: Orthopedics;  Laterality: Left;  Marland Kitchen VEIN LIGATION AND STRIPPING     legs    Current Outpatient Medications  Medication Sig Dispense Refill  . Biotin w/ Vitamins C & E (HAIR/SKIN/NAILS PO) Take by mouth.    . Chlorpheniramine  Maleate (ALLERGY PO) Take by mouth.    . EVENING PRIMROSE OIL PO Take by mouth.    Marland Kitchen FLUoxetine (PROZAC) 40 MG capsule Take 1 capsule (40 mg total) by mouth daily. 90 capsule 3   No current facility-administered medications for this visit.      ALLERGIES: Codeine; Oxycodone; and Vicodin [hydrocodone-acetaminophen]  Family History  Problem Relation Age of Onset  . Hypertension Father     Social History   Socioeconomic History  . Marital status: Divorced    Spouse name: Not on file  . Number of children: Not on file  . Years of education: Not on file  . Highest education level: Not on file  Occupational History  . Not on file  Social Needs  . Financial resource strain: Not on file  . Food insecurity:    Worry: Not on file    Inability: Not on file  . Transportation needs:    Medical: Not on file    Non-medical: Not on file  Tobacco Use  . Smoking status: Former Smoker    Last attempt to quit: 04/30/1984  Years since quitting: 34.5  . Smokeless tobacco: Never Used  Substance and Sexual Activity  . Alcohol use: Yes    Alcohol/week: 0.0 standard drinks    Comment: 1 glasses wine per day  . Drug use: No  . Sexual activity: Not Currently    Birth control/protection: None, Abstinence  Lifestyle  . Physical activity:    Days per week: Not on file    Minutes per session: Not on file  . Stress: Not on file  Relationships  . Social connections:    Talks on phone: Not on file    Gets together: Not on file    Attends religious service: Not on file    Active member of club or organization: Not on file    Attends meetings of clubs or organizations: Not on file    Relationship status: Not on file  . Intimate partner violence:    Fear of current or ex partner: Not on file    Emotionally abused: Not on file    Physically abused: Not on file    Forced sexual activity: Not on file  Other Topics Concern  . Not on file  Social History Narrative  . Not on file    Review  of Systems  Constitutional: Negative.   HENT: Negative.   Eyes: Negative.   Respiratory: Negative.   Cardiovascular: Negative.   Gastrointestinal: Negative.   Genitourinary: Negative.   Musculoskeletal: Negative.   Skin: Negative.   Neurological: Negative.   Endo/Heme/Allergies: Negative.   Psychiatric/Behavioral: Negative.     PHYSICAL EXAMINATION:    BP 118/70   Pulse 68   Temp 97.7 F (36.5 C) (Oral)   Resp 16   Wt 169 lb (76.7 kg)   LMP 10/17/2018 (Exact Date)   BMI 28.12 kg/m     General appearance: alert, cooperative and appears stated age  Pelvic: External genitalia:  no lesions              Urethra:  normal appearing urethra with no masses, tenderness or lesions              Bartholins and Skenes: normal                 Vagina: normal appearing vagina with normal color and discharge, no lesions              Cervix:no gross lesions  Colposcopy: not satisfactory, mild aceto-white changes at 5 o'clock, biopsy taken. ECC done. Silver nitrate used for hemostasis. Negative lugols examination of the cervix and upper vagina.  Chaperone was present for exam.  ASSESSMENT ASCUS, +HPV pap    PLAN Colposcopy with biopsy and ECC Discussed abnormal paps and cervical dysplasia.  Discussed need for f/u and possible treatment   An After Visit Summary was printed and given to the patient.

## 2018-10-24 ENCOUNTER — Ambulatory Visit (INDEPENDENT_AMBULATORY_CARE_PROVIDER_SITE_OTHER): Payer: Medicaid Other | Admitting: Obstetrics and Gynecology

## 2018-10-24 ENCOUNTER — Other Ambulatory Visit: Payer: Self-pay

## 2018-10-24 ENCOUNTER — Encounter: Payer: Self-pay | Admitting: Obstetrics and Gynecology

## 2018-10-24 VITALS — BP 118/70 | HR 68 | Temp 97.7°F | Resp 16 | Wt 169.0 lb

## 2018-10-24 DIAGNOSIS — R87810 Cervical high risk human papillomavirus (HPV) DNA test positive: Secondary | ICD-10-CM

## 2018-10-24 DIAGNOSIS — R87610 Atypical squamous cells of undetermined significance on cytologic smear of cervix (ASC-US): Secondary | ICD-10-CM

## 2018-10-24 DIAGNOSIS — Z01812 Encounter for preprocedural laboratory examination: Secondary | ICD-10-CM

## 2018-10-24 DIAGNOSIS — A630 Anogenital (venereal) warts: Secondary | ICD-10-CM

## 2018-10-24 LAB — POCT URINE PREGNANCY: Preg Test, Ur: NEGATIVE

## 2018-10-24 NOTE — Patient Instructions (Signed)

## 2018-10-28 ENCOUNTER — Telehealth: Payer: Self-pay

## 2018-10-28 NOTE — Telephone Encounter (Signed)
-----   Message from Salvadore Dom, MD sent at 10/25/2018  2:16 PM EDT ----- Please inform the patient that her biopsies both returned with CIN I. She needs a f/u pap and hpv in one year.

## 2018-10-28 NOTE — Telephone Encounter (Signed)
Left message to call Kaitlyn at [phone]. 

## 2018-10-29 NOTE — Telephone Encounter (Signed)
Spoke with patient. Results given. Patient verbalizes understanding. 08 recall entered. Encounter closed.

## 2018-10-29 NOTE — Telephone Encounter (Signed)
Patient returning call to Kaitlyn. °

## 2018-12-26 ENCOUNTER — Other Ambulatory Visit: Payer: Self-pay | Admitting: Family Medicine

## 2019-03-27 ENCOUNTER — Other Ambulatory Visit: Payer: Self-pay | Admitting: Family Medicine

## 2019-03-28 NOTE — Telephone Encounter (Signed)
Patient need to schedule an ov for more refills. 

## 2019-04-02 ENCOUNTER — Encounter: Payer: Self-pay | Admitting: Family Medicine

## 2019-04-02 ENCOUNTER — Ambulatory Visit (INDEPENDENT_AMBULATORY_CARE_PROVIDER_SITE_OTHER): Payer: Self-pay | Admitting: Family Medicine

## 2019-04-02 ENCOUNTER — Other Ambulatory Visit: Payer: Self-pay

## 2019-04-02 VITALS — BP 140/80 | HR 98 | Temp 98.1°F | Ht 65.0 in | Wt 177.4 lb

## 2019-04-02 DIAGNOSIS — F1021 Alcohol dependence, in remission: Secondary | ICD-10-CM

## 2019-04-02 DIAGNOSIS — F418 Other specified anxiety disorders: Secondary | ICD-10-CM

## 2019-04-02 DIAGNOSIS — Z23 Encounter for immunization: Secondary | ICD-10-CM

## 2019-04-02 MED ORDER — HYDROXYZINE HCL 50 MG PO TABS: 50.0000 mg | ORAL_TABLET | Freq: Four times a day (QID) | ORAL | 5 refills | Status: DC | PRN

## 2019-04-02 MED ORDER — VENLAFAXINE HCL ER 150 MG PO CP24: 150.0000 mg | ORAL_CAPSULE | Freq: Every day | ORAL | 3 refills | Status: DC

## 2019-04-02 NOTE — Progress Notes (Signed)
   Subjective:    Patient ID: Kim Elliott, female    DOB: 18-Jun-1966, 53 y.o.   MRN: [id number]  HPI Here to follow up on depression and anxiety. Since December she has been struggling with her emotions, but she has stayed off alcohol. She has not attended a meeting for a long time. She was fired from her job at the beginning of the year and she has been getting unemployment since then. She asks for a medical letter to allow her to continue getting this without actively pursuing a job. She does not think she can handle a job yet emotionally. She says the Prozac doe snot help as much as it used to.    Review of Systems  Constitutional: Negative.   Respiratory: Negative.   Cardiovascular: Negative.   Neurological: Negative.   Psychiatric/Behavioral: Positive for decreased concentration, dysphoric mood and sleep disturbance. Negative for agitation, behavioral problems, confusion, hallucinations, self-injury and suicidal ideas. The patient is nervous/anxious.        Objective:   Physical Exam Constitutional:      Appearance: Normal appearance.  Cardiovascular:     Rate and Rhythm: Normal rate and regular rhythm.     Pulses: Normal pulses.     Heart sounds: Normal heart sounds.  Pulmonary:     Effort: Pulmonary effort is normal.     Breath sounds: Normal breath sounds.  Neurological:     Mental Status: She is alert.  Psychiatric:        Behavior: Behavior normal.        Thought Content: Thought content normal.        Judgment: Judgment normal.     Comments: Affect is depressed            Assessment & Plan:  Depression with anxiety. We will stop Prozac and switch to Effexor XR 150 mg daily. Recheck in 3-4 weeks. I did write her a letter to allow her to receive unemployment benefits for another 6 months. We spent 40 minutes discussing these issues.  Alysia Penna, MD

## 2019-05-02 ENCOUNTER — Ambulatory Visit: Payer: Medicaid Other | Admitting: Family Medicine

## 2019-05-05 ENCOUNTER — Other Ambulatory Visit: Payer: Self-pay

## 2019-05-05 ENCOUNTER — Encounter: Payer: Self-pay | Admitting: Family Medicine

## 2019-05-05 ENCOUNTER — Ambulatory Visit (INDEPENDENT_AMBULATORY_CARE_PROVIDER_SITE_OTHER): Payer: Self-pay | Admitting: Family Medicine

## 2019-05-05 VITALS — BP 130/100 | HR 94 | Temp 97.6°F | Ht 65.0 in | Wt 175.0 lb

## 2019-05-05 DIAGNOSIS — F418 Other specified anxiety disorders: Secondary | ICD-10-CM

## 2019-05-05 NOTE — Progress Notes (Signed)
   Subjective:    Patient ID: Kim Elliott, female    DOB: Nov 18, 1965, 53 y.o.   MRN: [id number]  HPI Here to follow up on depression and anxiety. She is pleased with the Effexor XR 150 mg each morning. It works better than the Prozac did, and she has no side effects to report. However she finds that she struggles in the mornings before the medication kicks in.    Review of Systems  Constitutional: Negative.   Respiratory: Negative.   Cardiovascular: Negative.   Psychiatric/Behavioral: Positive for dysphoric mood. The patient is nervous/anxious.        Objective:   Physical Exam Constitutional:      Appearance: Normal appearance.  Cardiovascular:     Rate and Rhythm: Normal rate and regular rhythm.     Pulses: Normal pulses.     Heart sounds: Normal heart sounds.  Pulmonary:     Effort: Pulmonary effort is normal.     Breath sounds: Normal breath sounds.  Neurological:     Mental Status: She is alert.  Psychiatric:        Mood and Affect: Mood normal.        Thought Content: Thought content normal.        Judgment: Judgment normal.           Assessment & Plan:  Her depression and anxiety has responded to the Effexor. We will increase the dosing to BID. Recheck at a well exam in a few weeks.  Alysia Penna, MD

## 2019-05-05 NOTE — Patient Instructions (Signed)
Health Maintenance Due  Topic Date Due  . HIV Screening  03/13/1982  . MAMMOGRAM  03/13/2017  . COLONOSCOPY  03/13/2017    No flowsheet data found.

## 2019-06-11 ENCOUNTER — Encounter: Payer: Self-pay | Admitting: Family Medicine

## 2019-07-10 ENCOUNTER — Encounter: Payer: Self-pay | Admitting: Family Medicine

## 2019-08-07 ENCOUNTER — Ambulatory Visit (INDEPENDENT_AMBULATORY_CARE_PROVIDER_SITE_OTHER): Payer: Self-pay | Admitting: Family Medicine

## 2019-08-07 ENCOUNTER — Encounter: Payer: Self-pay | Admitting: Family Medicine

## 2019-08-07 ENCOUNTER — Other Ambulatory Visit: Payer: Self-pay

## 2019-08-07 VITALS — BP 150/90 | HR 102 | Temp 97.4°F | Wt 177.2 lb

## 2019-08-07 DIAGNOSIS — I10 Essential (primary) hypertension: Secondary | ICD-10-CM | POA: Insufficient documentation

## 2019-08-07 DIAGNOSIS — Z0000 Encounter for general adult medical examination without abnormal findings: Secondary | ICD-10-CM

## 2019-08-07 LAB — BASIC METABOLIC PANEL
BUN: 11 mg/dL (ref 6–23)
CO2: 28 mEq/L (ref 19–32)
Calcium: 9.4 mg/dL (ref 8.4–10.5)
Chloride: 102 mEq/L (ref 96–112)
Creatinine, Ser: 0.67 mg/dL (ref 0.40–1.20)
GFR: 92.28 mL/min (ref >60.00)
Glucose, Bld: 95 mg/dL (ref 70–99)
Potassium: 4.3 mEq/L (ref 3.5–5.1)
Sodium: 136 mEq/L (ref 135–145)

## 2019-08-07 LAB — CBC WITH DIFFERENTIAL/PLATELET
Basophils Absolute: 0.1 10*3/uL (ref 0.0–0.1)
Basophils Relative: 2.3 % (ref 0.0–3.0)
Eosinophils Absolute: 0.1 10*3/uL (ref 0.0–0.7)
Eosinophils Relative: 1.1 % (ref 0.0–5.0)
HCT: 40.9 % (ref 36.0–46.0)
Hemoglobin: 13.5 g/dL (ref 12.0–15.0)
Lymphocytes Relative: 27.7 % (ref 12.0–46.0)
Lymphs Abs: 1.4 10*3/uL (ref 0.7–4.0)
MCHC: 32.9 g/dL (ref 30.0–36.0)
MCV: 92.4 fl (ref 78.0–100.0)
Monocytes Absolute: 0.4 10*3/uL (ref 0.1–1.0)
Monocytes Relative: 6.9 % (ref 3.0–12.0)
Neutro Abs: 3.1 10*3/uL (ref 1.4–7.7)
Neutrophils Relative %: 62 % (ref 43.0–77.0)
Platelets: 248 10*3/uL (ref 150.0–400.0)
RBC: 4.43 Mil/uL (ref 3.87–5.11)
RDW: 15.9 % — ABNORMAL HIGH (ref 11.5–15.5)
WBC: 5.1 10*3/uL (ref 4.0–10.5)

## 2019-08-07 LAB — LIPID PANEL
Cholesterol: 264 mg/dL — ABNORMAL HIGH (ref 0–200)
HDL: 60.5 mg/dL (ref >39.00)
LDL Cholesterol: 165 mg/dL — ABNORMAL HIGH (ref 0–99)
NonHDL: 203.12
Total CHOL/HDL Ratio: 4
Triglycerides: 190 mg/dL — ABNORMAL HIGH (ref 0.0–149.0)
VLDL: 38 mg/dL (ref 0.0–40.0)

## 2019-08-07 LAB — HEPATIC FUNCTION PANEL
ALT: 19 U/L (ref 0–35)
AST: 20 U/L (ref 0–37)
Albumin: 4.4 g/dL (ref 3.5–5.2)
Alkaline Phosphatase: 80 U/L (ref 39–117)
Bilirubin, Direct: 0.1 mg/dL (ref 0.0–0.3)
Total Bilirubin: 0.4 mg/dL (ref 0.2–1.2)
Total Protein: 7.7 g/dL (ref 6.0–8.3)

## 2019-08-07 LAB — TSH: TSH: 2.16 u[IU]/mL (ref 0.35–4.50)

## 2019-08-07 LAB — T3, FREE: T3, Free: 3.2 pg/mL (ref 2.3–4.2)

## 2019-08-07 LAB — T4, FREE: Free T4: 0.6 ng/dL (ref 0.60–1.60)

## 2019-08-07 MED ORDER — METOPROLOL SUCCINATE ER 50 MG PO TB24: 50.0000 mg | ORAL_TABLET | Freq: Every day | ORAL | 3 refills | Status: DC

## 2019-08-07 NOTE — Progress Notes (Signed)
Subjective:    Patient ID: Kim Elliott, female    DOB: May 08, 1966, 54 y.o.   MRN: [id number]  HPI Here for a well exam. She has felt fairly well but she bemoans some recent weight gain despite watching her diet and trying to exercise. She also mentions some intermittent pains in the hands and ankles. This responds to Ibuprofen. We have been treating her depression and anxiety with Effexor XR and she is pleased with this. She briefly took this BID but it made her shaky so she has gone back to once a day. I see from her chart that her BP has been mildly elevated foe several months.    Review of Systems  Constitutional: Negative.   HENT: Negative.   Eyes: Negative.   Respiratory: Negative.   Cardiovascular: Negative.   Gastrointestinal: Negative.   Genitourinary: Negative for decreased urine volume, difficulty urinating, dyspareunia, dysuria, enuresis, flank pain, frequency, hematuria, pelvic pain and urgency.  Musculoskeletal: Negative.   Skin: Negative.   Neurological: Negative.   Psychiatric/Behavioral: Negative.        Objective:   Physical Exam Constitutional:      General: She is not in acute distress.    Appearance: She is well-developed.  HENT:     Head: Normocephalic and atraumatic.     Right Ear: External ear normal.     Left Ear: External ear normal.     Nose: Nose normal.     Mouth/Throat:     Pharynx: No oropharyngeal exudate.  Eyes:     General: No scleral icterus.    Conjunctiva/sclera: Conjunctivae normal.     Pupils: Pupils are equal, round, and reactive to light.  Neck:     Thyroid: No thyromegaly.     Vascular: No JVD.  Cardiovascular:     Rate and Rhythm: Normal rate and regular rhythm.     Heart sounds: Normal heart sounds. No murmur. No friction rub. No gallop.   Pulmonary:     Effort: Pulmonary effort is normal. No respiratory distress.     Breath sounds: Normal breath sounds. No wheezing or rales.  Chest:     Chest wall: No tenderness.    Abdominal:     General: Bowel sounds are normal. There is no distension.     Palpations: Abdomen is soft. There is no mass.     Tenderness: There is no abdominal tenderness. There is no guarding or rebound.  Musculoskeletal:        General: No tenderness. Normal range of motion.     Cervical back: Normal range of motion and neck supple.  Lymphadenopathy:     Cervical: No cervical adenopathy.  Skin:    General: Skin is warm and dry.     Findings: No erythema or rash.  Neurological:     Mental Status: She is alert and oriented to person, place, and time.     Cranial Nerves: No cranial nerve deficit.     Motor: No abnormal muscle tone.     Coordination: Coordination normal.     Deep Tendon Reflexes: Reflexes are normal and symmetric. Reflexes normal.  Psychiatric:        Behavior: Behavior normal.        Thought Content: Thought content normal.        Judgment: Judgment normal.           Assessment & Plan:  Well exam. We discussed diet and exercise. Get fasting labs. Treat the HTN with Metoprolol succinate 50 mg  daily. Set up a colonoscopy. She will see her GYN next week. Recheck in 4 weeks.  Alysia Penna, MD

## 2019-08-11 ENCOUNTER — Other Ambulatory Visit: Payer: Self-pay

## 2019-08-12 ENCOUNTER — Encounter: Payer: Self-pay | Admitting: Internal Medicine

## 2019-08-13 ENCOUNTER — Other Ambulatory Visit (HOSPITAL_COMMUNITY)
Admission: RE | Admit: 2019-08-13 | Discharge: 2019-08-13 | Disposition: A | Discharge status: Home / Self Care | Payer: Medicaid Other | Source: Ambulatory Visit | Attending: Obstetrics and Gynecology | Admitting: Obstetrics and Gynecology

## 2019-08-13 ENCOUNTER — Ambulatory Visit: Payer: Medicaid Other | Admitting: Obstetrics and Gynecology

## 2019-08-13 ENCOUNTER — Encounter: Payer: Self-pay | Admitting: Obstetrics and Gynecology

## 2019-08-13 ENCOUNTER — Other Ambulatory Visit: Payer: Self-pay

## 2019-08-13 VITALS — BP 140/82 | HR 84 | Temp 98.4°F | Ht 64.0 in | Wt 173.0 lb

## 2019-08-13 DIAGNOSIS — Z124 Encounter for screening for malignant neoplasm of cervix: Secondary | ICD-10-CM

## 2019-08-13 DIAGNOSIS — Z01419 Encounter for gynecological examination (general) (routine) without abnormal findings: Secondary | ICD-10-CM | POA: Diagnosis not present

## 2019-08-13 NOTE — Progress Notes (Signed)
54 y.o. G5P3 Divorced White or Caucasian Not Hispanic or Latino female here for annual exam.  Patient states that she has no concerns today.  Cycles are every 1-2 months Period Duration (Days): 3-7 Period Pattern: (!) Irregular Menstrual Flow: Heavy Menstrual Control: Maxi pad Menstrual Control Change Freq (Hours): first 2 days every 30 min  after that flow lessens Dysmenorrhea: (!) Severe Dysmenorrhea Symptoms: Cramping  No change in flow or cramps. Not anemic.   Life is still stressful. Youngest son is 17, has Crohn's, currently stable, back in school.  Struggling with anxiety and depression. She is on effexor, helping.   Patient's last menstrual period was 08/13/2019 (exact date).          Sexually active: No.  The current method of family planning is abstinence.    Exercising: Yes.    Walking daily  Smoker:  no  Health Maintenance: Pap:  08/02/18 ASCUS + HR HPV, 10/24/18 Colposcopy: not satisfactory, biopsy CIN I, ECC CIN I History of abnormal Pap:  yes MMG:  Never BMD:   Never  Colonoscopy: Scheduled for September 09, 2019 TDaP:  2019 Gardasil: NA   reports that she quit smoking about 35 years ago. She has never used smokeless tobacco. She reports current alcohol use. She reports that she does not use drugs. 1 glass of wine a day. 3 grown kids She is looking for a job.   Past Medical History:  Diagnosis Date  . Abnormal Pap smear of cervix   . Anxiety   . Cancer ([place])   . Closed fracture of left clavicle 05/05/2014  . Depression   . Dysmenorrhea   . Medical history non-contributory   . Melanoma in situ of left lower leg ([place])   . Migraine without aura     Past Surgical History:  Procedure Laterality Date  . BREAST SURGERY  1995   lt br bx-neg  . COLPOSCOPY    . DILATION AND CURETTAGE OF UTERUS     x2post misscarage  . MELANOMA EXCISION  2016   left lower leg  . ORIF CLAVICULAR FRACTURE Left 05/05/2014   Procedure: OPEN REDUCTION INTERNAL FIXATION (ORIF) LEFT   CLAVICULAR FRACTURE;  Surgeon: Johnny Bridge, MD;  Location: [facility];  Service: Orthopedics;  Laterality: Left;  Marland Kitchen VEIN LIGATION AND STRIPPING     legs    Current Outpatient Medications  Medication Sig Dispense Refill  . Biotin w/ Vitamins C & E (HAIR/SKIN/NAILS PO) Take by mouth.    . Chlorpheniramine Maleate (ALLERGY PO) Take by mouth.    . EVENING PRIMROSE OIL PO Take by mouth.    . metoprolol succinate (TOPROL-XL) 50 MG 24 hr tablet Take 1 tablet (50 mg total) by mouth daily. Take with or immediately following a meal. 30 tablet 3  . venlafaxine XR (EFFEXOR XR) 150 MG 24 hr capsule Take 1 capsule (150 mg total) by mouth daily with breakfast. 90 capsule 3   No current facility-administered medications for this visit.    Family History  Problem Relation Age of Onset  . Hypertension Father   . Crohn's disease Other     Review of Systems  Genitourinary: Positive for menstrual problem and vaginal bleeding.  All other systems reviewed and are negative.   Exam:   BP 140/82   Pulse 84   Temp 98.4 F (36.9 C)   Ht 5\' 4"  (1.626 m)   Wt 173 lb (78.5 kg)   LMP 08/13/2019 (Exact Date)   SpO2  97%   BMI 29.70 kg/m   Weight change: @WEIGHTCHANGE @ Height:   Height: 5\' 4"  (162.6 cm)  Ht Readings from Last 3 Encounters:  08/13/19 5\' 4"  (1.626 m)  05/05/19 5\' 5"  (1.651 m)  04/02/19 5\' 5"  (1.651 m)    General appearance: alert, cooperative and appears stated age Head: Normocephalic, without obvious abnormality, atraumatic Neck: no adenopathy, supple, symmetrical, trachea midline and thyroid normal to inspection and palpation Lungs: clear to auscultation bilaterally Cardiovascular: regular rate and rhythm Breasts: normal appearance, no masses or tenderness Abdomen: soft, non-tender; non distended,  no masses,  no organomegaly Extremities: extremities normal, atraumatic, no cyanosis or edema Skin: Skin color, texture, turgor normal. No rashes or lesions Lymph  nodes: Cervical, supraclavicular, and axillary nodes normal. No abnormal inguinal nodes palpated Neurologic: Grossly normal   Pelvic: External genitalia:  no lesions              Urethra:  normal appearing urethra with no masses, tenderness or lesions              Bartholins and Skenes: normal                 Vagina: normal appearing vagina with normal color and discharge, no lesions              Cervix: no lesions               Bimanual Exam:  Uterus:  normal size, contour, position, consistency, mobility, non-tender              Adnexa: no mass, fullness, tenderness               Rectovaginal: Confirms               Anus:  normal sphincter tone, no lesions  Gae Dry chaperoned for the exam.  A:  Well Woman with normal exam  No change in menorrhagia, not anemic  Depression, anxiety on medication  CIN I  P:   Pap with hpv  Mammogram recommended  Colonoscopy scheduled  Discussed breast self exam  Discussed calcium and vit D intake  Labs with primary

## 2019-08-13 NOTE — Patient Instructions (Signed)
EXERCISE AND DIET:  We recommended that you start or continue a regular exercise program for good health. Regular exercise means any activity that makes your heart beat faster and makes you sweat.  We recommend exercising at least 30 minutes per day at least 3 days a week, preferably 4 or 5.  We also recommend a diet low in fat and sugar.  Inactivity, poor dietary choices and obesity can cause diabetes, heart attack, stroke, and kidney damage, among others.    ALCOHOL AND SMOKING:  Women should limit their alcohol intake to no more than 7 drinks/beers/glasses of wine (combined, not each!) per week. Moderation of alcohol intake to this level decreases your risk of breast cancer and liver damage. And of course, no recreational drugs are part of a healthy lifestyle.  And absolutely no smoking or even second hand smoke. Most people know smoking can cause heart and lung diseases, but did you know it also contributes to weakening of your bones? Aging of your skin?  Yellowing of your teeth and nails?  CALCIUM AND VITAMIN D:  Adequate intake of calcium and Vitamin D are recommended.  The recommendations for exact amounts of these supplements seem to change often, but generally speaking 1,000 mg of calcium (between diet and supplement) and 800 units of Vitamin D per day seems prudent. Certain women may benefit from higher intake of Vitamin D.  If you are among these women, your doctor will have told you during your visit.    PAP SMEARS:  Pap smears, to check for cervical cancer or precancers,  have traditionally been done yearly, although recent scientific advances have shown that most women can have pap smears less often.  However, every woman still should have a physical exam from her gynecologist every year. It will include a breast check, inspection of the vulva and vagina to check for abnormal growths or skin changes, a visual exam of the cervix, and then an exam to evaluate the size and shape of the uterus and  ovaries.  And after 53 years of age, a rectal exam is indicated to check for rectal cancers. We will also provide age appropriate advice regarding health maintenance, like when you should have certain vaccines, screening for sexually transmitted diseases, bone density testing, colonoscopy, mammograms, etc.   MAMMOGRAMS:  All women over 40 years old should have a yearly mammogram. Many facilities now offer a "3D" mammogram, which may cost around $50 extra out of pocket. If possible,  we recommend you accept the option to have the 3D mammogram performed.  It both reduces the number of women who will be called back for extra views which then turn out to be normal, and it is better than the routine mammogram at detecting truly abnormal areas.    COLON CANCER SCREENING: Now recommend starting at age 45. At this time colonoscopy is not covered for routine screening until 50. There are take home tests that can be done between 45-49.   COLONOSCOPY:  Colonoscopy to screen for colon cancer is recommended for all women at age 50.  We know, you hate the idea of the prep.  We agree, BUT, having colon cancer and not knowing it is worse!!  Colon cancer so often starts as a polyp that can be seen and removed at colonscopy, which can quite literally save your life!  And if your first colonoscopy is normal and you have no family history of colon cancer, most women don't have to have it again for   10 years.  Once every ten years, you can do something that may end up saving your life, right?  We will be happy to help you get it scheduled when you are ready.  Be sure to check your insurance coverage so you understand how much it will cost.  It may be covered as a preventative service at no cost, but you should check your particular policy.      Breast Self-Awareness Breast self-awareness means being familiar with how your breasts look and feel. It involves checking your breasts regularly and reporting any changes to your  health care provider. Practicing breast self-awareness is important. A change in your breasts can be a sign of a serious medical problem. Being familiar with how your breasts look and feel allows you to find any problems early, when treatment is more likely to be successful. All women should practice breast self-awareness, including women who have had breast implants. How to do a breast self-exam One way to learn what is normal for your breasts and whether your breasts are changing is to do a breast self-exam. To do a breast self-exam: Look for Changes  1. Remove all the clothing above your waist. 2. Stand in front of a mirror in a room with good lighting. 3. Put your hands on your hips. 4. Push your hands firmly downward. 5. Compare your breasts in the mirror. Look for differences between them (asymmetry), such as: ? Differences in shape. ? Differences in size. ? Puckers, dips, and bumps in one breast and not the other. 6. Look at each breast for changes in your skin, such as: ? Redness. ? Scaly areas. 7. Look for changes in your nipples, such as: ? Discharge. ? Bleeding. ? Dimpling. ? Redness. ? A change in position. Feel for Changes Carefully feel your breasts for lumps and changes. It is best to do this while lying on your back on the floor and again while sitting or standing in the shower or tub with soapy water on your skin. Feel each breast in the following way:  Place the arm on the side of the breast you are examining above your head.  Feel your breast with the other hand.  Start in the nipple area and make  inch (2 cm) overlapping circles to feel your breast. Use the pads of your three middle fingers to do this. Apply light pressure, then medium pressure, then firm pressure. The light pressure will allow you to feel the tissue closest to the skin. The medium pressure will allow you to feel the tissue that is a little deeper. The firm pressure will allow you to feel the tissue  close to the ribs.  Continue the overlapping circles, moving downward over the breast until you feel your ribs below your breast.  Move one finger-width toward the center of the body. Continue to use the  inch (2 cm) overlapping circles to feel your breast as you move slowly up toward your collarbone.  Continue the up and down exam using all three pressures until you reach your armpit.  Write Down What You Find  Write down what is normal for each breast and any changes that you find. Keep a written record with breast changes or normal findings for each breast. By writing this information down, you do not need to depend only on memory for size, tenderness, or location. Write down where you are in your menstrual cycle, if you are still menstruating. If you are having trouble noticing differences   in your breasts, do not get discouraged. With time you will become more familiar with the variations in your breasts and more comfortable with the exam. How often should I examine my breasts? Examine your breasts every month. If you are breastfeeding, the best time to examine your breasts is after a feeding or after using a breast pump. If you menstruate, the best time to examine your breasts is 5-7 days after your period is over. During your period, your breasts are lumpier, and it may be more difficult to notice changes. When should I see my health care provider? See your health care provider if you notice:  A change in shape or size of your breasts or nipples.  A change in the skin of your breast or nipples, such as a reddened or scaly area.  Unusual discharge from your nipples.  A lump or thick area that was not there before.  Pain in your breasts.  Anything that concerns you.  

## 2019-08-15 LAB — CYTOLOGY - PAP
Comment: NEGATIVE
Diagnosis: UNDETERMINED — AB
High risk HPV: NEGATIVE

## 2019-08-26 ENCOUNTER — Telehealth: Payer: Self-pay | Admitting: Family Medicine

## 2019-08-26 MED ORDER — METOPROLOL SUCCINATE ER 100 MG PO TB24: 100.0000 mg | ORAL_TABLET | Freq: Every day | ORAL | 2 refills | Status: DC

## 2019-08-26 NOTE — Telephone Encounter (Signed)
Tell her to double up on the Metoprolol to 100 mg daily. She can take 2 tablets of the 50s. Also call in 100 mg daily, #30 with 2 rf. Keep the original follow up appt

## 2019-08-26 NOTE — Telephone Encounter (Signed)
per pt call, her BP is running high wants to know if she should keep her appt that is scheduled for 09-11-2018 or come in sooner  Please advise call back #[phone]

## 2019-08-26 NOTE — Addendum Note (Signed)
Addended by: Rebecca Eaton on: 08/26/2019 01:36 PM   Modules accepted: Orders

## 2019-09-09 ENCOUNTER — Encounter: Payer: Medicaid Other | Admitting: Internal Medicine

## 2019-09-11 ENCOUNTER — Other Ambulatory Visit: Payer: Self-pay

## 2019-09-11 ENCOUNTER — Ambulatory Visit: Payer: Self-pay | Admitting: Family Medicine

## 2019-09-12 ENCOUNTER — Encounter: Payer: Self-pay | Admitting: Family Medicine

## 2019-09-12 ENCOUNTER — Ambulatory Visit (INDEPENDENT_AMBULATORY_CARE_PROVIDER_SITE_OTHER): Payer: Medicaid Other | Admitting: Family Medicine

## 2019-09-12 VITALS — BP 122/74 | HR 70 | Temp 98.0°F | Ht 64.0 in | Wt 171.0 lb

## 2019-09-12 DIAGNOSIS — I10 Essential (primary) hypertension: Secondary | ICD-10-CM

## 2019-09-12 DIAGNOSIS — F418 Other specified anxiety disorders: Secondary | ICD-10-CM

## 2019-09-12 MED ORDER — METOPROLOL SUCCINATE ER 100 MG PO TB24: 100.0000 mg | ORAL_TABLET | Freq: Every day | ORAL | 3 refills | Status: DC

## 2019-09-12 NOTE — Progress Notes (Signed)
   Subjective:    Patient ID: Lauree Chandler, female    DOB: [birth year], 54 y.o.   MRN: [id number]  HPI Here to follow up. Since we increased the dose of Metoprolol her BP has been well controlled and her palpitations have stopped. She is pleased with Effexor XR once a day. 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    Review of Systems  Constitutional: Negative.   Respiratory: Negative.   Cardiovascular: Negative.   Neurological: Negative.   Psychiatric/Behavioral: Negative.        Objective:   Physical Exam Constitutional:      Appearance: Normal appearance.  Cardiovascular:     Rate and Rhythm: Normal rate and regular rhythm.     Pulses: Normal pulses.     Heart sounds: Normal heart sounds.  Pulmonary:     Effort: Pulmonary effort is normal.     Breath sounds: Normal breath sounds.  Neurological:     General: No focal deficit present.     Mental Status: She is alert and oriented to person, place, and time.  Psychiatric:        Mood and Affect: Mood normal.        Thought Content: Thought content normal.           Assessment & Plan:  Her HTN and anxiety are now stable. Recheck in 3 months.  Alysia Penna, MD

## 2019-11-03 ENCOUNTER — Other Ambulatory Visit: Payer: Self-pay | Admitting: Family Medicine

## 2019-12-12 ENCOUNTER — Other Ambulatory Visit: Payer: Self-pay

## 2019-12-15 ENCOUNTER — Encounter: Payer: Self-pay | Admitting: Family Medicine

## 2019-12-15 ENCOUNTER — Other Ambulatory Visit: Payer: Self-pay

## 2019-12-15 ENCOUNTER — Ambulatory Visit (INDEPENDENT_AMBULATORY_CARE_PROVIDER_SITE_OTHER): Payer: Self-pay | Admitting: Family Medicine

## 2019-12-15 VITALS — BP 130/70 | HR 78 | Temp 97.4°F | Wt 165.4 lb

## 2019-12-15 DIAGNOSIS — F418 Other specified anxiety disorders: Secondary | ICD-10-CM

## 2019-12-15 DIAGNOSIS — I10 Essential (primary) hypertension: Secondary | ICD-10-CM

## 2019-12-15 DIAGNOSIS — G5603 Carpal tunnel syndrome, bilateral upper limbs: Secondary | ICD-10-CM

## 2019-12-15 MED ORDER — VENLAFAXINE HCL ER 75 MG PO CP24: 75.0000 mg | ORAL_CAPSULE | Freq: Every day | ORAL | 3 refills | Status: DC

## 2019-12-15 NOTE — Progress Notes (Signed)
   Subjective:    Patient ID: Kim Elliott, female    DOB: June 25, 1966, 54 y.o.   MRN: [id number]  HPI Here for several issues. First she needs to follow up on HTN. Also her depression has been acting up a bit lately. She describes feeling sad often and it reminds her of when she first got on Effexor last year. Also for several weeks she has had intermittent numbness or tingling in both hands and in all the fingers. No swelling or pain. 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    Review of Systems  Constitutional: Negative.   Respiratory: Negative.   Cardiovascular: Negative.   Gastrointestinal: Negative.   Neurological: Positive for numbness.  Psychiatric/Behavioral: Positive for dysphoric mood. Negative for agitation, behavioral problems, confusion, decreased concentration, hallucinations, self-injury, sleep disturbance and suicidal ideas. The patient is not nervous/anxious.        Objective:   Physical Exam Constitutional:      Appearance: Normal appearance.  Cardiovascular:     Rate and Rhythm: Normal rate and regular rhythm.     Pulses: Normal pulses.     Heart sounds: Normal heart sounds.  Pulmonary:     Effort: Pulmonary effort is normal.     Breath sounds: Normal breath sounds.  Musculoskeletal:     Comments: The wrists and hands appear normal, no swelling or tenderness. Tinel's sign is positive at both wrists.   Neurological:     Mental Status: She is alert.           Assessment & Plan:  HTN is stable. She has mild carpal tunnel syndrome, and I advised her to wear wrist splints at all times when she is not working. For the depression we will increase Effexor XR to a total pf 225 mg daily.  Alysia Penna, MD

## 2020-03-19 ENCOUNTER — Other Ambulatory Visit: Payer: Self-pay | Admitting: Family Medicine

## 2020-03-19 NOTE — Telephone Encounter (Signed)
Okay to refill. Next appointment is 06/15/2020.

## 2020-03-29 ENCOUNTER — Other Ambulatory Visit: Payer: Self-pay | Admitting: Family Medicine

## 2020-03-29 DIAGNOSIS — I10 Essential (primary) hypertension: Secondary | ICD-10-CM

## 2020-03-29 DIAGNOSIS — F322 Major depressive disorder, single episode, severe without psychotic features: Secondary | ICD-10-CM

## 2020-03-29 MED ORDER — METOPROLOL SUCCINATE ER 100 MG PO TB24: 100.0000 mg | ORAL_TABLET | Freq: Every day | ORAL | 3 refills | Status: DC

## 2020-03-29 MED ORDER — VENLAFAXINE HCL ER 75 MG PO CP24: 75.0000 mg | ORAL_CAPSULE | Freq: Every day | ORAL | 3 refills | Status: DC

## 2020-03-29 NOTE — Addendum Note (Signed)
Addended by: Bonney Leitz T on: 03/29/2020 03:52 PM   Modules accepted: Orders

## 2020-03-29 NOTE — Telephone Encounter (Signed)
Refilled and sent to [facility] per request.

## 2020-03-29 NOTE — Telephone Encounter (Signed)
pt need a refill for  metoprolol succinate (TOPROL-XL) 100 MG 24 hr tablet venlafaxine XR (EFFEXOR XR) 75 MG 24 hr capsule [facility] [place], [place], [place] [address] [phone]

## 2020-03-29 NOTE — Addendum Note (Signed)
Addended by: Bonney Leitz T on: 03/29/2020 03:50 PM   Modules accepted: Orders

## 2020-05-05 ENCOUNTER — Telehealth: Payer: Self-pay | Admitting: Family Medicine

## 2020-05-05 NOTE — Telephone Encounter (Signed)
The patient called and said the wrong Rx dosage was called in for venlafaxine 75 MG. It supposes to be a venlafaxine XR (EFFEXOR-XR) 150 MG 24 hr capsule.  Preferred Pharmacies   [id number] - [place], [place] - [address]  Phone:  [phone] Fax:  [phone] [place] [id number], [place]

## 2020-05-07 ENCOUNTER — Other Ambulatory Visit: Payer: Self-pay | Admitting: *Deleted

## 2020-05-07 MED ORDER — VENLAFAXINE HCL ER 150 MG PO CP24: 150.0000 mg | ORAL_CAPSULE | Freq: Every day | ORAL | 3 refills | Status: DC

## 2020-05-07 NOTE — Telephone Encounter (Signed)
Refilled Effexor --[facility]

## 2020-05-31 ENCOUNTER — Telehealth: Payer: Self-pay

## 2020-06-01 ENCOUNTER — Ambulatory Visit (INDEPENDENT_AMBULATORY_CARE_PROVIDER_SITE_OTHER): Payer: Self-pay | Admitting: Family Medicine

## 2020-06-01 ENCOUNTER — Other Ambulatory Visit: Payer: Self-pay

## 2020-06-01 ENCOUNTER — Encounter: Payer: Self-pay | Admitting: Family Medicine

## 2020-06-01 VITALS — BP 108/80 | HR 67 | Temp 98.0°F | Ht 64.0 in | Wt 165.2 lb

## 2020-06-01 DIAGNOSIS — I10 Essential (primary) hypertension: Secondary | ICD-10-CM

## 2020-06-01 DIAGNOSIS — F322 Major depressive disorder, single episode, severe without psychotic features: Secondary | ICD-10-CM

## 2020-06-01 MED ORDER — VENLAFAXINE HCL ER 150 MG PO CP24
150.0000 mg | ORAL_CAPSULE | Freq: Two times a day (BID) | ORAL | 3 refills | Status: DC
Start: 2020-06-01 — End: 2020-12-15

## 2020-06-01 MED ORDER — TRAZODONE HCL 100 MG PO TABS: 100.0000 mg | ORAL_TABLET | Freq: Every day | ORAL | 3 refills | Status: DC

## 2020-06-01 NOTE — Progress Notes (Signed)
   Subjective:    Patient ID: Kim Elliott, female    DOB: [birth year], 54 y.o.   MRN: [id number]  HPI Here for increased stress in her life and for one week of mild SOB and numbness in the left arm. No chest pains. These symptoms are constant and are not affected by exertion. Her BP has been stable at home. Over the Thanksgiving weekend she had some stressful interactions with her children, and she has been feling more depressed and more anxious than usual. She denies any suicidal thoughts. She missed work yesterday and today because she did not think she could focus enough. She has not been sleeping well. She has not been using alcohol.   Review of Systems  Constitutional: Negative.   Respiratory: Positive for shortness of breath. Negative for cough and wheezing.   Cardiovascular: Negative.   Gastrointestinal: Negative.   Psychiatric/Behavioral: Positive for decreased concentration, dysphoric mood and sleep disturbance. Negative for agitation, behavioral problems, confusion, hallucinations, self-injury and suicidal ideas. The patient is nervous/anxious.        Objective:   Physical Exam Constitutional:      General: She is not in acute distress.    Appearance: Normal appearance. She is well-developed.  Cardiovascular:     Rate and Rhythm: Normal rate and regular rhythm.     Pulses: Normal pulses.     Heart sounds: Normal heart sounds.  Pulmonary:     Effort: Pulmonary effort is normal.     Breath sounds: Normal breath sounds.  Neurological:     Mental Status: She is alert.  Psychiatric:        Thought Content: Thought content normal.     Comments: Anxious            Assessment & Plan:  Her symptoms are the result of increased stress causing depression and anxiety. We will increase the Effexor XR to taking 150 mg at beakfast and another 150 mg at lunchtime. She will also try Trazodone 100 mg at bedtime for sleep. Her HTN is well controlled. She is written out of work  yesterday and today. Follow up in 2 weeks.  Alysia Penna, MD

## 2020-06-15 ENCOUNTER — Ambulatory Visit: Payer: Medicaid Other | Admitting: Family Medicine

## 2020-06-22 ENCOUNTER — Ambulatory Visit: Payer: Medicaid Other | Admitting: Family Medicine

## 2020-06-25 ENCOUNTER — Encounter: Payer: Self-pay | Admitting: Family Medicine

## 2020-06-29 NOTE — Telephone Encounter (Signed)
I think Dr. Romie Levee at [facility] is excellent. She specializes in colorectal surgeries

## 2020-07-06 ENCOUNTER — Ambulatory Visit (INDEPENDENT_AMBULATORY_CARE_PROVIDER_SITE_OTHER): Payer: Medicaid Other | Admitting: Family Medicine

## 2020-07-06 ENCOUNTER — Other Ambulatory Visit: Payer: Self-pay

## 2020-07-06 ENCOUNTER — Encounter: Payer: Self-pay | Admitting: Family Medicine

## 2020-07-06 VITALS — BP 122/90 | HR 71 | Temp 98.4°F | Ht 64.0 in | Wt 163.2 lb

## 2020-07-06 DIAGNOSIS — Z23 Encounter for immunization: Secondary | ICD-10-CM

## 2020-07-06 DIAGNOSIS — F418 Other specified anxiety disorders: Secondary | ICD-10-CM

## 2020-07-06 MED ORDER — LORAZEPAM 0.5 MG PO TABS: 0.5000 mg | ORAL_TABLET | Freq: Two times a day (BID) | ORAL | 2 refills | Status: DC | PRN

## 2020-07-06 NOTE — Progress Notes (Signed)
   Subjective:    Patient ID: Kim Elliott, female    DOB: 1965-07-08, 55 y.o.   MRN: [id number]  HPI Here to follow up on depression and anxiety. At our last visit we increased the Effexor XR 150 mg to take one with breakfast and one with lunch, and this has been very helpful for her to get through her afternoons at work. We also added Trazodone for sleep and this has had mixed results. She feels better in general, but she is very worried about her son's health. She asks for something she can take to relax on an as needed basis. She took Lorazepam in the past and this worked well for her.    Review of Systems  Constitutional: Negative.   Respiratory: Negative.   Cardiovascular: Negative.   Psychiatric/Behavioral: Positive for dysphoric mood and sleep disturbance. Negative for agitation, behavioral problems, confusion, hallucinations, self-injury and suicidal ideas. The patient is nervous/anxious.        Objective:   Physical Exam Constitutional:      Appearance: Normal appearance.  Cardiovascular:     Rate and Rhythm: Normal rate and regular rhythm.     Pulses: Normal pulses.     Heart sounds: Normal heart sounds.  Pulmonary:     Effort: Pulmonary effort is normal.     Breath sounds: Normal breath sounds.  Neurological:     General: No focal deficit present.     Mental Status: She is alert and oriented to person, place, and time.  Psychiatric:        Behavior: Behavior normal.        Thought Content: Thought content normal.     Comments: Anxious            Assessment & Plan:  Depression with anxiety, improved. We will add Lorazepam 0.5 mg to take prn. Gershon Crane, MD

## 2020-08-18 ENCOUNTER — Ambulatory Visit: Payer: Medicaid Other | Admitting: Obstetrics and Gynecology

## 2020-08-24 ENCOUNTER — Other Ambulatory Visit: Payer: Self-pay

## 2020-08-25 ENCOUNTER — Encounter: Payer: Self-pay | Admitting: Family Medicine

## 2020-08-25 ENCOUNTER — Ambulatory Visit (INDEPENDENT_AMBULATORY_CARE_PROVIDER_SITE_OTHER): Payer: 59 | Admitting: Family Medicine

## 2020-08-25 VITALS — BP 142/100 | HR 73 | Temp 97.9°F | Wt 161.8 lb

## 2020-08-25 DIAGNOSIS — F322 Major depressive disorder, single episode, severe without psychotic features: Secondary | ICD-10-CM | POA: Diagnosis not present

## 2020-08-25 DIAGNOSIS — Z86006 Personal history of melanoma in-situ: Secondary | ICD-10-CM

## 2020-08-25 DIAGNOSIS — R87619 Unspecified abnormal cytological findings in specimens from cervix uteri: Secondary | ICD-10-CM

## 2020-08-25 DIAGNOSIS — F418 Other specified anxiety disorders: Secondary | ICD-10-CM | POA: Diagnosis not present

## 2020-08-25 MED ORDER — LORAZEPAM 1 MG PO TABS: 1.0000 mg | ORAL_TABLET | Freq: Two times a day (BID) | ORAL | 5 refills | Status: DC | PRN

## 2020-08-25 NOTE — Progress Notes (Signed)
   Subjective:    Patient ID: Lauree Chandler, female    DOB: 09/13/1965, 55 y.o.   MRN: [id number]  HPI Here to follow up on depression with anxiety and to discuss what is going on with her 54 year old daughter living in [place]. Her daughter has been involuntarily confined to a psychiatric hospital by her father, and no one will give Jahnay any information about this because her daughter has not given them permission to talk to her mother. Of course this has upset Evva and she asks if the dose of Lorazepam can be increased. She is pleased with how Effexor is helping her depression and how Trazodone is helping her sleep. She is exercising and going to work every day.    Review of Systems  Constitutional: Negative.   Respiratory: Negative.   Cardiovascular: Negative.   Psychiatric/Behavioral: Positive for decreased concentration and dysphoric mood. Negative for agitation, behavioral problems, confusion, hallucinations, self-injury and suicidal ideas. The patient is nervous/anxious.        Objective:   Physical Exam Constitutional:      Appearance: Normal appearance.  Cardiovascular:     Rate and Rhythm: Normal rate and regular rhythm.     Pulses: Normal pulses.     Heart sounds: Normal heart sounds.  Pulmonary:     Effort: Pulmonary effort is normal.     Breath sounds: Normal breath sounds.  Neurological:     Mental Status: She is alert.  Psychiatric:        Behavior: Behavior normal.        Thought Content: Thought content normal.        Judgment: Judgment normal.     Comments: Very anxious and tearful            Assessment & Plan:  We will keep the Trazodone and the Effexor XR as they are, but we will increase the Lorazepam to 1 mg to use BID as needed. We spent 40 minutes together discussing these issues. Alysia Penna, MD

## 2020-08-26 DIAGNOSIS — Z86006 Personal history of melanoma in-situ: Secondary | ICD-10-CM | POA: Insufficient documentation

## 2020-08-26 NOTE — Addendum Note (Signed)
Addended by: Alysia Penna A on: 08/26/2020 11:03 AM   Modules accepted: Orders

## 2020-09-28 ENCOUNTER — Ambulatory Visit: Payer: Medicaid Other | Admitting: Obstetrics and Gynecology

## 2020-10-07 NOTE — Progress Notes (Signed)
55 y.o. G23P3 Divorced White or Caucasian Not Hispanic or Latino female here for annual exam. Dating, same person x 4 months.  Sexually active, no pain.  Cycles have been every 2-3 months. Bleeds x 7 days. She saturates a pad in  30 minutes. Intermittently cramps are severe. Was told she had fibroids a long time ago.  Occasional vasomotor symptoms.  Period Duration (Days): 7 Period Pattern: (!) Irregular Menstrual Flow: Heavy Dysmenorrhea: (!) Severe (Occasional) Dysmenorrhea Symptoms: Cramping  Patient's last menstrual period was 10/02/2020.          Sexually active: Yes.    The current method of family planning is none.    Exercising: Yes.    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 She has never used smokeless tobacco. She reports current alcohol use. She reports that she does not use drugs. 1 glass of wine a day. Working in Mid[employer]. Son with Crohn's, stable. Daughter in [place] (not speaking with her mom). Has some good friends.    Past Medical History:  Diagnosis Date  . Abnormal Pap smear of cervix   . Anxiety   . Cancer ([facility])   . Closed fracture of left clavicle 05/05/2014  . Depression   . Dysmenorrhea   . Medical history non-contributory   . 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 Migraine without aura     Past Surgical History:  Procedure Laterality Date  . BREAST SURGERY  1995   lt br bx-neg  . COLPOSCOPY    . DILATION AND CURETTAGE OF UTERUS     x2post misscarage  . MELANOMA EXCISION  2016   left lower leg  . ORIF CLAVICULAR FRACTURE Left 05/05/2014   Procedure: OPEN REDUCTION INTERNAL FIXATION (ORIF) LEFT  CLAVICULAR FRACTURE;  Surgeon: Johnny Bridge, MD;  Location: [facility];  Service: Orthopedics;  Laterality: Left;  Marland Kitchen VEIN LIGATION AND STRIPPING     legs    Current Outpatient Medications  Medication Sig Dispense Refill  . Biotin w/ Vitamins C & E (HAIR/SKIN/NAILS PO) Take by mouth.    . Chlorpheniramine Maleate (ALLERGY PO) Take by mouth.    . EVENING PRIMROSE OIL PO Take by mouth.    Marland Kitchen LORazepam (ATIVAN) 1 MG tablet Take 1 tablet (1 mg total) by mouth 2 (two) times daily as needed for anxiety. 60 tablet 5  . metoprolol succinate (TOPROL-XL) 100 MG 24 hr tablet Take 1 tablet (100 mg total) by mouth daily. Take with or immediately following a meal. 90 tablet 3  . venlafaxine XR (EFFEXOR-XR) 150 MG 24 hr capsule Take 1 capsule (150 mg total) by mouth in the morning and at bedtime. 180 capsule 3  . traZODone (DESYREL) 100 MG tablet Take 1 tablet (100 mg total) by mouth at bedtime. (Patient not taking: Reported on 10/08/2020) 90 tablet 3   No current facility-administered medications for this visit.    Family History  Problem Relation Age of Onset  . Hypertension Father   . Crohn's disease Other     Review of Systems  Exam:   BP 124/80   Ht 5\' 4"  (1.626 m)   Wt 169  lb (76.7 kg)   LMP 10/02/2020   BMI 29.01 kg/m   Weight change: @WEIGHTCHANGE @ Height:   Height: 5\' 4"  (162.6 cm)  Ht Readings from Last 3 Encounters:  10/08/20 5\' 4"  (1.626 m)  07/06/20 5\' 4"  (1.626 m)  06/01/20 5\' 4"  (1.626 m)    General appearance: alert, cooperative and appears stated age Head: Normocephalic, without obvious abnormality, atraumatic Neck: no adenopathy, supple, symmetrical, trachea midline and thyroid normal to inspection and palpation Lungs: clear to auscultation bilaterally Cardiovascular: regular rate and rhythm Breasts: normal appearance, no masses or tenderness Abdomen: soft, non-tender; non distended,  no masses,  no organomegaly Extremities: extremities normal, atraumatic, no cyanosis or edema Skin: Skin color, texture, turgor normal. No rashes or  lesions Lymph nodes: Cervical, supraclavicular, and axillary nodes normal. No abnormal inguinal nodes palpated Neurologic: Grossly normal   Pelvic: External genitalia:  no lesions              Urethra:  normal appearing urethra with no masses, tenderness or lesions              Bartholins and Skenes: normal                 Vagina: normal appearing vagina with normal color and discharge, no lesions              Cervix: no lesions               Bimanual Exam:  Uterus:  normal size, contour, position, consistency, mobility, non-tender              Adnexa: no mass, fullness, tenderness               Rectovaginal: Confirms               Anus:  normal sphincter tone, no lesions  Caryn Bee chaperoned for the exam.  1. Well woman exam Discussed breast self exam Discussed calcium and vit D intake Mammogram, # given to schedule  2. Menorrhagia with irregular cycle - CBC - TSH - Ferritin - medroxyPROGESTERone (PROVERA) 5 MG tablet; Take one tablet a day for 5 days every month if no spontaneous cycles.  Dispense: 15 tablet; Refill: 3 If her bleeding doesn't improve, will set her up for an ultrasound  3. Screening examination for STD (sexually transmitted disease) - Cytology - PAP - HIV Antibody (routine testing w rflx) - RPR  4. Screening for cervical cancer - Cytology - PAP  5. Colon cancer screening - Ambulatory referral to Gastroenterology

## 2020-10-08 ENCOUNTER — Other Ambulatory Visit (HOSPITAL_COMMUNITY)
Admission: RE | Admit: 2020-10-08 | Discharge: 2020-10-08 | Disposition: A | Discharge status: Home / Self Care | Payer: 59 | Source: Ambulatory Visit | Attending: Obstetrics and Gynecology | Admitting: Obstetrics and Gynecology

## 2020-10-08 ENCOUNTER — Encounter: Payer: Self-pay | Admitting: Obstetrics and Gynecology

## 2020-10-08 ENCOUNTER — Ambulatory Visit (INDEPENDENT_AMBULATORY_CARE_PROVIDER_SITE_OTHER): Payer: 59 | Admitting: Obstetrics and Gynecology

## 2020-10-08 ENCOUNTER — Other Ambulatory Visit: Payer: Self-pay

## 2020-10-08 VITALS — BP 124/80 | Ht 64.0 in | Wt 169.0 lb

## 2020-10-08 DIAGNOSIS — Z01419 Encounter for gynecological examination (general) (routine) without abnormal findings: Secondary | ICD-10-CM

## 2020-10-08 DIAGNOSIS — N921 Excessive and frequent menstruation with irregular cycle: Secondary | ICD-10-CM | POA: Diagnosis not present

## 2020-10-08 DIAGNOSIS — Z124 Encounter for screening for malignant neoplasm of cervix: Secondary | ICD-10-CM | POA: Diagnosis not present

## 2020-10-08 DIAGNOSIS — Z1211 Encounter for screening for malignant neoplasm of colon: Secondary | ICD-10-CM

## 2020-10-08 DIAGNOSIS — Z113 Encounter for screening for infections with a predominantly sexual mode of transmission: Secondary | ICD-10-CM | POA: Insufficient documentation

## 2020-10-08 MED ORDER — MEDROXYPROGESTERONE ACETATE 5 MG PO TABS: ORAL_TABLET | ORAL | 3 refills | Status: DC

## 2020-10-08 NOTE — Patient Instructions (Signed)

## 2020-10-10 ENCOUNTER — Other Ambulatory Visit: Payer: Self-pay | Admitting: Obstetrics and Gynecology

## 2020-10-10 DIAGNOSIS — D500 Iron deficiency anemia secondary to blood loss (chronic): Secondary | ICD-10-CM

## 2020-10-11 ENCOUNTER — Encounter: Payer: Self-pay | Admitting: Obstetrics and Gynecology

## 2020-10-11 LAB — CBC
HCT: 34.2 % — ABNORMAL LOW (ref 35.0–45.0)
Hemoglobin: 11.1 g/dL — ABNORMAL LOW (ref 11.7–15.5)
MCH: 30.8 pg (ref 27.0–33.0)
MCHC: 32.5 g/dL (ref 32.0–36.0)
MCV: 95 fL (ref 80.0–100.0)
MPV: 11.5 fL (ref 7.5–12.5)
Platelets: 241 10*3/uL (ref 140–400)
RBC: 3.6 10*6/uL — ABNORMAL LOW (ref 3.80–5.10)
RDW: 13.3 % (ref 11.0–15.0)
WBC: 6.8 10*3/uL (ref 3.8–10.8)

## 2020-10-11 LAB — RPR: RPR Ser Ql: NONREACTIVE

## 2020-10-11 LAB — TSH: TSH: 3.59 mIU/L

## 2020-10-11 LAB — FERRITIN: Ferritin: 8 ng/mL — ABNORMAL LOW (ref 16–232)

## 2020-10-11 LAB — HIV ANTIBODY (ROUTINE TESTING W REFLEX): HIV 1&2 Ab, 4th Generation: NONREACTIVE

## 2020-10-13 LAB — CYTOLOGY - PAP
Chlamydia: NEGATIVE
Comment: NEGATIVE
Comment: NEGATIVE
Comment: NORMAL
Diagnosis: NEGATIVE
Diagnosis: REACTIVE
High risk HPV: NEGATIVE
Neisseria Gonorrhea: NEGATIVE

## 2020-11-16 ENCOUNTER — Other Ambulatory Visit: Payer: Self-pay

## 2020-11-17 ENCOUNTER — Encounter: Payer: Self-pay | Admitting: Family Medicine

## 2020-11-17 ENCOUNTER — Ambulatory Visit (INDEPENDENT_AMBULATORY_CARE_PROVIDER_SITE_OTHER): Payer: 59 | Admitting: Family Medicine

## 2020-11-17 VITALS — BP 152/110 | HR 81 | Temp 98.5°F | Wt 167.0 lb

## 2020-11-17 DIAGNOSIS — F418 Other specified anxiety disorders: Secondary | ICD-10-CM

## 2020-11-17 DIAGNOSIS — I10 Essential (primary) hypertension: Secondary | ICD-10-CM
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
## 2020-11-18 ENCOUNTER — Telehealth: Payer: Self-pay | Admitting: Family Medicine

## 2020-11-18 NOTE — Telephone Encounter (Signed)
Pt Disability form has been received and placed on Dr Sarajane Jews folder for completing

## 2020-11-18 NOTE — Telephone Encounter (Signed)
Disability and Leave Provider Statement to be filled out--placed in dr's folder.  Call [phone] upon completion.

## 2020-11-19 ENCOUNTER — Other Ambulatory Visit: Payer: Self-pay

## 2020-11-19 ENCOUNTER — Encounter: Payer: Self-pay | Admitting: Family Medicine

## 2020-11-19 MED ORDER — AMLODIPINE BESYLATE 5 MG PO TABS: 5.0000 mg | ORAL_TABLET | Freq: Every day | ORAL | 3 refills | Status: DC

## 2020-11-19 NOTE — Telephone Encounter (Signed)
Tell her to continue taking the Metoprolol in the morning, but we will add Amlodipine 5 mg daily in the evenings. Call in #30 with 3 rf. See me in a couple weeks

## 2020-11-19 NOTE — Telephone Encounter (Signed)
Pt was notified and Rx for amlodipine sent to pt pharmacy. Pt also aware that the forms are ready for pick up from the office

## 2020-11-22 NOTE — Telephone Encounter (Signed)
Pt Disability form has been completed and faxed as requested by pt, aware to pick up a copy from the office. Copy sent to scanning

## 2020-12-02 MED ORDER — AMLODIPINE BESYLATE 10 MG PO TABS: 10.0000 mg | ORAL_TABLET | Freq: Every day | ORAL | 3 refills | Status: DC

## 2020-12-02 NOTE — Telephone Encounter (Signed)
Increase the evening Amlodipine to 10 mg daily, call in #30 with 3 rf. Have her contact [place] at [phone] for a therapist. Follow up with [place] for the BP in 3-4 weeks

## 2020-12-13 ENCOUNTER — Ambulatory Visit (INDEPENDENT_AMBULATORY_CARE_PROVIDER_SITE_OTHER): Payer: 59 | Admitting: Psychology

## 2020-12-13 DIAGNOSIS — F4323 Adjustment disorder with mixed anxiety and depressed mood: Secondary | ICD-10-CM | POA: Diagnosis not present

## 2020-12-14 ENCOUNTER — Other Ambulatory Visit: Payer: Self-pay

## 2020-12-15 ENCOUNTER — Ambulatory Visit (INDEPENDENT_AMBULATORY_CARE_PROVIDER_SITE_OTHER): Payer: 59 | Admitting: Family Medicine

## 2020-12-15 ENCOUNTER — Encounter: Payer: Self-pay | Admitting: Family Medicine

## 2020-12-15 VITALS — BP 140/98 | HR 77 | Temp 98.1°F | Wt 172.0 lb

## 2020-12-15 DIAGNOSIS — I10 Essential (primary) hypertension: Secondary | ICD-10-CM | POA: Diagnosis not present

## 2020-12-15 DIAGNOSIS — F418 Other specified anxiety disorders: Secondary | ICD-10-CM

## 2020-12-15 MED ORDER — LISINOPRIL 10 MG PO TABS: 10.0000 mg | ORAL_TABLET | Freq: Every day | ORAL | 3 refills | Status: DC

## 2020-12-15 MED ORDER — LORAZEPAM 1 MG PO TABS
1.0000 mg | ORAL_TABLET | Freq: Three times a day (TID) | ORAL | 5 refills | Status: DC | PRN
Start: 2020-12-15 — End: 2021-08-09

## 2020-12-15 MED ORDER — VENLAFAXINE HCL ER 150 MG PO CP24: 150.0000 mg | ORAL_CAPSULE | Freq: Two times a day (BID) | ORAL | 3 refills | Status: DC

## 2020-12-15 NOTE — Progress Notes (Addendum)
   Subjective:    Patient ID: Kim Elliott, female    DOB: 1965/09/29, 55 y.o.   MRN: [id number]  HPI Here to follow up on HTN and depression with anxiety. She has been taking Metoprolol and Amlodipine for the BP, but this has remained poorly controlled. She averages in the 140s over 90s at home. Also her anxiety levels remain high. She was able to meet with a therapist for the first time last week, and they plan to meet weekly for awhile. She still struggles with her anxiety and knows she cannot return to work any time soon.    Review of Systems  Constitutional: Negative.   Respiratory: Negative.    Cardiovascular: Negative.   Psychiatric/Behavioral:  Positive for decreased concentration, dysphoric mood and sleep disturbance. Negative for behavioral problems and confusion. The patient is nervous/anxious.       Objective:   Physical Exam Constitutional:      Appearance: Normal appearance.  Cardiovascular:     Rate and Rhythm: Normal rate and regular rhythm.     Pulses: Normal pulses.     Heart sounds: Normal heart sounds.  Pulmonary:     Effort: Pulmonary effort is normal.     Breath sounds: Normal breath sounds.  Neurological:     Mental Status: She is alert.  Psychiatric:        Behavior: Behavior normal.        Thought Content: Thought content normal.        Judgment: Judgment normal.     Comments: She is quite anxious           Assessment & Plan:  Her HTN is still not controlled, so we will add Lisinopril 10 mg daily. She will stay on Effexor XR and Lorazepam for the depression and anxiety. She will see her therapist every week. We will write her out of work until 02-14-21. We spent 35 minutes today discussing these issues. She will follow up with [place] in 3 weeks.  Alysia Penna, MD

## 2020-12-17 ENCOUNTER — Ambulatory Visit: Payer: 59 | Admitting: Family Medicine

## 2020-12-20 ENCOUNTER — Encounter: Payer: Self-pay | Admitting: Family Medicine

## 2020-12-20 ENCOUNTER — Ambulatory Visit (INDEPENDENT_AMBULATORY_CARE_PROVIDER_SITE_OTHER): Payer: 59 | Admitting: Psychology

## 2020-12-20 DIAGNOSIS — F4323 Adjustment disorder with mixed anxiety and depressed mood: Secondary | ICD-10-CM

## 2020-12-20 NOTE — Telephone Encounter (Signed)
Please check on this.

## 2020-12-21 ENCOUNTER — Encounter: Payer: Self-pay | Admitting: Family Medicine

## 2020-12-21 NOTE — Telephone Encounter (Signed)
Pt forms have been faxed and pt is aware to f/u in 3 weeks per Dr Sarajane Jews

## 2020-12-21 NOTE — Telephone Encounter (Signed)
I addended my note on 12-15-20 that she should follow up with [place] in 3 weeks. Please send this office note to [facility] and tell her about the follow up visit. Were there other forms for me to fill out also?

## 2020-12-21 NOTE — Telephone Encounter (Signed)
Pt Disability/Leave paperwork was completed and faxed as directed. Pt is aware to pick up copies/ work letter at the front office. Copy sent to scanning

## 2020-12-27 ENCOUNTER — Ambulatory Visit (INDEPENDENT_AMBULATORY_CARE_PROVIDER_SITE_OTHER): Payer: 59 | Admitting: Psychology

## 2020-12-27 DIAGNOSIS — F4323 Adjustment disorder with mixed anxiety and depressed mood: Secondary | ICD-10-CM | POA: Diagnosis not present

## 2021-01-10 ENCOUNTER — Ambulatory Visit (INDEPENDENT_AMBULATORY_CARE_PROVIDER_SITE_OTHER): Payer: 59 | Admitting: Psychology

## 2021-01-10 DIAGNOSIS — F4323 Adjustment disorder with mixed anxiety and depressed mood: Secondary | ICD-10-CM | POA: Diagnosis not present

## 2021-01-13 ENCOUNTER — Other Ambulatory Visit: Payer: Self-pay

## 2021-01-14 ENCOUNTER — Encounter: Payer: Self-pay | Admitting: Family Medicine

## 2021-01-14 ENCOUNTER — Ambulatory Visit (INDEPENDENT_AMBULATORY_CARE_PROVIDER_SITE_OTHER): Payer: 59 | Admitting: Family Medicine

## 2021-01-14 VITALS — BP 120/90 | HR 70 | Temp 98.8°F | Wt 175.2 lb

## 2021-01-14 DIAGNOSIS — I10 Essential (primary) hypertension: Secondary | ICD-10-CM | POA: Diagnosis not present

## 2021-01-14 DIAGNOSIS — F1029 Alcohol dependence with unspecified alcohol-induced disorder: Secondary | ICD-10-CM | POA: Diagnosis not present

## 2021-01-14 DIAGNOSIS — F418 Other specified anxiety disorders: Secondary | ICD-10-CM

## 2021-01-14 DIAGNOSIS — Z1231 Encounter for screening mammogram for malignant neoplasm of breast: Secondary | ICD-10-CM | POA: Diagnosis not present

## 2021-01-14 NOTE — Progress Notes (Signed)
   Subjective:    Patient ID: Kim Elliott, female    DOB: 1965-08-29, 55 y.o.   MRN: [id number]  HPI Here to follow up on HTN and depression with anxiety. Her BP at home has been stable. She is still struggling with her moods however. The medications help, and she has been meeting with her therapist weekly. She is beginning to deal with a lot of issues from her past, and this has been difficult for her. She still feels it would be impossible for her to go back to work at this time. She admits to drinking some alcohol lately, and she is attending [place] meetings again.     Review of Systems  Constitutional: Negative.   Respiratory: Negative.    Cardiovascular: Negative.   Psychiatric/Behavioral:  Positive for agitation, decreased concentration and dysphoric mood. Negative for behavioral problems and confusion. The patient is nervous/anxious.       Objective:   Physical Exam Constitutional:      Appearance: Normal appearance.  Cardiovascular:     Rate and Rhythm: Normal rate and regular rhythm.     Pulses: Normal pulses.     Heart sounds: Normal heart sounds.  Pulmonary:     Effort: Pulmonary effort is normal.     Breath sounds: Normal breath sounds.  Neurological:     Mental Status: She is alert.  Psychiatric:        Behavior: Behavior normal.        Thought Content: Thought content normal.        Judgment: Judgment normal.     Comments: Anxious and tearful           Assessment & Plan:  Her HTN is now well controlled, but the depression with anxiety is still an issue. She will remain out of work for the time being. Recheck in one month. We spent 35 minutes reviewing records and discussing these issues.   Alysia Penna, MD

## 2021-01-18 ENCOUNTER — Encounter: Payer: Self-pay | Admitting: Family Medicine

## 2021-01-18 ENCOUNTER — Ambulatory Visit (INDEPENDENT_AMBULATORY_CARE_PROVIDER_SITE_OTHER): Payer: 59 | Admitting: Psychology

## 2021-01-18 ENCOUNTER — Telehealth: Payer: Self-pay | Admitting: Family Medicine

## 2021-01-18 DIAGNOSIS — Z0279 Encounter for issue of other medical certificate: Secondary | ICD-10-CM

## 2021-01-18 DIAGNOSIS — F4323 Adjustment disorder with mixed anxiety and depressed mood: Secondary | ICD-10-CM

## 2021-01-18 NOTE — Telephone Encounter (Signed)
Disability and Leave forms to be filled out--placed in dr's folder.  Fax to: [phone] and call patient to pick up as well at [phone].

## 2021-01-18 NOTE — Telephone Encounter (Signed)
The form is ready  

## 2021-01-20 ENCOUNTER — Encounter: Payer: Self-pay | Admitting: Internal Medicine

## 2021-01-20 ENCOUNTER — Encounter: Payer: Self-pay | Admitting: Family Medicine

## 2021-01-20 NOTE — Telephone Encounter (Signed)
I'm not sure what happened. When I filled out her form I intended to put her out of work at least until September. She can bring the form back sometime tomorrow and I will be glad to change the date on the form

## 2021-01-21 ENCOUNTER — Telehealth: Payer: Self-pay | Admitting: Family Medicine

## 2021-01-21 NOTE — Telephone Encounter (Signed)
Last office visit- 01/14/21

## 2021-01-21 NOTE — Telephone Encounter (Signed)
I filled out the new from and used 04-05-21 as the return to work date

## 2021-01-21 NOTE — Telephone Encounter (Signed)
Patient dropped off forms that she would like Dr. Sarajane Jews to complete for her.   Patient would like form faxed to [phone].  She would also like a call once forms have been faxed over at [phone].  Forms will be placed in folder.  Please advise.

## 2021-01-21 NOTE — Telephone Encounter (Signed)
Forms have been faxed and pt has been informed via [place].

## 2021-01-21 NOTE — Telephone Encounter (Signed)
I filled out the new disability form and put the return to work date as 04-05-21. Please let her know

## 2021-01-31 ENCOUNTER — Ambulatory Visit (INDEPENDENT_AMBULATORY_CARE_PROVIDER_SITE_OTHER): Payer: 59 | Admitting: Psychology

## 2021-01-31 DIAGNOSIS — F4323 Adjustment disorder with mixed anxiety and depressed mood: Secondary | ICD-10-CM | POA: Diagnosis not present

## 2021-02-03 ENCOUNTER — Encounter: Payer: Self-pay | Admitting: Family Medicine

## 2021-02-04 NOTE — Telephone Encounter (Signed)
Disability form was  refax to [place]

## 2021-02-04 NOTE — Telephone Encounter (Signed)
I did fill these forms out. Please look into this

## 2021-02-07 ENCOUNTER — Other Ambulatory Visit: Payer: Self-pay

## 2021-02-07 ENCOUNTER — Ambulatory Visit (AMBULATORY_SURGERY_CENTER): Payer: 59 | Admitting: *Deleted

## 2021-02-07 VITALS — Ht 65.0 in | Wt 167.0 lb

## 2021-02-07 DIAGNOSIS — Z1211 Encounter for screening for malignant neoplasm of colon: Secondary | ICD-10-CM

## 2021-02-07 MED ORDER — NA SULFATE-K SULFATE-MG SULF 17.5-3.13-1.6 GM/177ML PO SOLN: 1.0000 | Freq: Once | ORAL | 0 refills | Status: AC

## 2021-02-07 NOTE — Progress Notes (Signed)
No egg or soy allergy known to patient  No issues with past sedation with any surgeries or procedures Patient denies ever being told they had issues or difficulty with intubation  No FH of Malignant Hyperthermia No diet pills per patient No home 02 use per patient  No blood thinners per patient  Pt denies issues with constipation - better on Probiotic- currently having a soft regular daily BM per pt  No A fib or A flutter  EMMI video to pt or via [place] 19 guidelines implemented in [place] today with Pt and RN  Pt is fully vaccinated  for Covid    Due to the COVID-19 pandemic we are asking patients to follow certain guidelines.  Pt aware of COVID protocols and [facility] guidelines   Pt verified name, DOB, address and insurance during [place] today. Pt mailed instruction packet to included paper to complete and mail back to [facility] with addressed and stamped envelope, [place] video, copy of consent form to read and not return, and instructions.  Pt encouraged to call with questions or issues.  My Chart instructions to pt as well

## 2021-02-11 ENCOUNTER — Ambulatory Visit (INDEPENDENT_AMBULATORY_CARE_PROVIDER_SITE_OTHER): Payer: 59 | Admitting: Family Medicine

## 2021-02-11 ENCOUNTER — Other Ambulatory Visit: Payer: Self-pay

## 2021-02-11 ENCOUNTER — Encounter: Payer: Self-pay | Admitting: Family Medicine

## 2021-02-11 VITALS — BP 118/82 | HR 81 | Temp 98.3°F | Wt 176.0 lb

## 2021-02-11 DIAGNOSIS — R739 Hyperglycemia, unspecified: Secondary | ICD-10-CM | POA: Diagnosis not present

## 2021-02-11 DIAGNOSIS — R1013 Epigastric pain: Secondary | ICD-10-CM | POA: Diagnosis not present

## 2021-02-11 DIAGNOSIS — F418 Other specified anxiety disorders: Secondary | ICD-10-CM

## 2021-02-11 DIAGNOSIS — I10 Essential (primary) hypertension: Secondary | ICD-10-CM

## 2021-02-11 LAB — T3, FREE: T3, Free: 4.8 pg/mL — ABNORMAL HIGH (ref 2.3–4.2)

## 2021-02-11 LAB — HEPATIC FUNCTION PANEL
ALT: 23 U/L (ref 0–35)
AST: 24 U/L (ref 0–37)
Albumin: 4.3 g/dL (ref 3.5–5.2)
Alkaline Phosphatase: 78 U/L (ref 39–117)
Bilirubin, Direct: 0.1 mg/dL (ref 0.0–0.3)
Total Bilirubin: 0.5 mg/dL (ref 0.2–1.2)
Total Protein: 7.4 g/dL (ref 6.0–8.3)

## 2021-02-11 LAB — CBC WITH DIFFERENTIAL/PLATELET
Basophils Absolute: 0 10*3/uL (ref 0.0–0.1)
Basophils Relative: 0.9 % (ref 0.0–3.0)
Eosinophils Absolute: 0.1 10*3/uL (ref 0.0–0.7)
Eosinophils Relative: 1.1 % (ref 0.0–5.0)
HCT: 37.8 % (ref 36.0–46.0)
Hemoglobin: 12.7 g/dL (ref 12.0–15.0)
Lymphocytes Relative: 32.9 % (ref 12.0–46.0)
Lymphs Abs: 1.7 10*3/uL (ref 0.7–4.0)
MCHC: 33.7 g/dL (ref 30.0–36.0)
MCV: 95.7 fl (ref 78.0–100.0)
Monocytes Absolute: 0.4 10*3/uL (ref 0.1–1.0)
Monocytes Relative: 7.3 % (ref 3.0–12.0)
Neutro Abs: 3 10*3/uL (ref 1.4–7.7)
Neutrophils Relative %: 57.8 % (ref 43.0–77.0)
Platelets: 214 10*3/uL (ref 150.0–400.0)
RBC: 3.95 Mil/uL (ref 3.87–5.11)
RDW: 14.9 % (ref 11.5–15.5)
WBC: 5.1 10*3/uL (ref 4.0–10.5)

## 2021-02-11 LAB — BASIC METABOLIC PANEL
BUN: 18 mg/dL (ref 6–23)
CO2: 28 mEq/L (ref 19–32)
Calcium: 9.6 mg/dL (ref 8.4–10.5)
Chloride: 100 mEq/L (ref 96–112)
Creatinine, Ser: 0.75 mg/dL (ref 0.40–1.20)
GFR: 90.58 mL/min (ref >60.00)
Glucose, Bld: 87 mg/dL (ref 70–99)
Potassium: 5 mEq/L (ref 3.5–5.1)
Sodium: 137 mEq/L (ref 135–145)

## 2021-02-11 LAB — HEMOGLOBIN A1C: Hgb A1c MFr Bld: 5.8 % (ref 4.6–6.5)

## 2021-02-11 LAB — VITAMIN B12: Vitamin B-12: 295 pg/mL (ref 211–911)

## 2021-02-11 LAB — T4, FREE: Free T4: 0.83 ng/dL (ref 0.60–1.60)

## 2021-02-11 NOTE — Progress Notes (Signed)
   Subjective:    Patient ID: Lauree Chandler, female    DOB: Sep 04, 1965, 55 y.o.   MRN: [id number]  HPI Here for several issues. First she wants to follow up depression and anxiety, as well as her HTN. She has been out of work as we have been working to get these under control. Her BP is now well controlled. Her depression and anxiety are also under much better control. Her medications are working well. She is ready to return to work on September 12 as we had planned. However she also complains of 2 weeks of an intermittent upper abdominal pain. She describes this as a "pressure" more than a pain, and she can feel it "moving around" in the upper abdomen. There is no heartburn or belching. No nausea or fever. Her BMs are regular.    Review of Systems  Constitutional: Negative.   Respiratory: Negative.    Cardiovascular: Negative.   Gastrointestinal:  Positive for abdominal pain. Negative for abdominal distention, anal bleeding, blood in stool, constipation, diarrhea, nausea, rectal pain and vomiting.  Genitourinary: Negative.   Neurological: Negative.   Psychiatric/Behavioral: Negative.        Objective:   Physical Exam Constitutional:      Appearance: Normal appearance. She is not ill-appearing.  Cardiovascular:     Rate and Rhythm: Normal rate and regular rhythm.     Pulses: Normal pulses.     Heart sounds: Normal heart sounds.  Pulmonary:     Effort: Pulmonary effort is normal.     Breath sounds: Normal breath sounds.  Abdominal:     General: Abdomen is flat. Bowel sounds are normal. There is no distension.     Palpations: Abdomen is soft. There is no mass.     Tenderness: There is no guarding or rebound.     Hernia: No hernia is present.     Comments: Mildly tender in the upper abdomen   Neurological:     Mental Status: She is alert.  Psychiatric:        Mood and Affect: Mood normal.        Behavior: Behavior normal.        Thought Content: Thought content normal.           Assessment & Plan:  Her HTN is stable. Her depression and anxiety are much improved. We will plan on her returning to work on 03-14-21. As for the abdominal pain, we will get labs today and set up for an [place] next week. We spent 35 minutes reviewing records and discussing these issues.   Alysia Penna, MD

## 2021-02-16 ENCOUNTER — Encounter: Payer: Self-pay | Admitting: Family Medicine

## 2021-02-16 ENCOUNTER — Ambulatory Visit
Admission: RE | Admit: 2021-02-16 | Discharge: 2021-02-16 | Disposition: A | Discharge status: Home / Self Care | Payer: 59 | Source: Ambulatory Visit | Attending: Family Medicine | Admitting: Family Medicine

## 2021-02-16 DIAGNOSIS — R1013 Epigastric pain: Secondary | ICD-10-CM

## 2021-02-17 ENCOUNTER — Encounter: Payer: Self-pay | Admitting: Family Medicine

## 2021-02-17 ENCOUNTER — Other Ambulatory Visit: Payer: 59

## 2021-02-18 ENCOUNTER — Encounter: Payer: Self-pay | Admitting: Family Medicine

## 2021-02-21 NOTE — Telephone Encounter (Signed)
Besides gall bladder issues, the other common cause for upper abdominal pain is duodenitis which is acid irritation of the duodenum. Please call in Omeprazole 40 mg daily for her to try, #60 with 2 rf

## 2021-02-22 ENCOUNTER — Ambulatory Visit (INDEPENDENT_AMBULATORY_CARE_PROVIDER_SITE_OTHER): Payer: 59 | Admitting: Psychology

## 2021-02-22 ENCOUNTER — Other Ambulatory Visit: Payer: Self-pay

## 2021-02-22 DIAGNOSIS — F4323 Adjustment disorder with mixed anxiety and depressed mood: Secondary | ICD-10-CM

## 2021-02-23 ENCOUNTER — Other Ambulatory Visit: Payer: Self-pay

## 2021-02-23 ENCOUNTER — Other Ambulatory Visit: Payer: 59

## 2021-02-23 MED ORDER — OMEPRAZOLE 40 MG PO CPDR: 40.0000 mg | DELAYED_RELEASE_CAPSULE | Freq: Every day | ORAL | 2 refills | Status: DC

## 2021-02-24 ENCOUNTER — Encounter: Payer: 59 | Admitting: Internal Medicine

## 2021-03-04 ENCOUNTER — Ambulatory Visit (INDEPENDENT_AMBULATORY_CARE_PROVIDER_SITE_OTHER): Payer: 59 | Admitting: Psychology

## 2021-03-04 DIAGNOSIS — F4323 Adjustment disorder with mixed anxiety and depressed mood: Secondary | ICD-10-CM | POA: Diagnosis not present

## 2021-03-08 ENCOUNTER — Encounter: Payer: Self-pay | Admitting: Family Medicine

## 2021-03-08 ENCOUNTER — Ambulatory Visit (INDEPENDENT_AMBULATORY_CARE_PROVIDER_SITE_OTHER): Payer: 59 | Admitting: Obstetrics and Gynecology

## 2021-03-08 ENCOUNTER — Encounter: Payer: Self-pay | Admitting: Obstetrics and Gynecology

## 2021-03-08 ENCOUNTER — Other Ambulatory Visit (HOSPITAL_COMMUNITY)
Admission: RE | Admit: 2021-03-08 | Discharge: 2021-03-08 | Disposition: A | Discharge status: Home / Self Care | Payer: 59 | Source: Ambulatory Visit | Attending: Obstetrics and Gynecology | Admitting: Obstetrics and Gynecology

## 2021-03-08 ENCOUNTER — Telehealth: Payer: Self-pay | Admitting: *Deleted

## 2021-03-08 ENCOUNTER — Other Ambulatory Visit: Payer: Self-pay

## 2021-03-08 VITALS — BP 114/70 | HR 77 | Resp 16 | Wt 175.0 lb

## 2021-03-08 DIAGNOSIS — R109 Unspecified abdominal pain: Secondary | ICD-10-CM

## 2021-03-08 DIAGNOSIS — N760 Acute vaginitis: Secondary | ICD-10-CM

## 2021-03-08 DIAGNOSIS — K429 Umbilical hernia without obstruction or gangrene: Secondary | ICD-10-CM

## 2021-03-08 DIAGNOSIS — R350 Frequency of micturition: Secondary | ICD-10-CM | POA: Diagnosis not present

## 2021-03-08 DIAGNOSIS — N939 Abnormal uterine and vaginal bleeding, unspecified: Secondary | ICD-10-CM | POA: Diagnosis not present

## 2021-03-08 DIAGNOSIS — L821 Other seborrheic keratosis: Secondary | ICD-10-CM | POA: Insufficient documentation

## 2021-03-08 DIAGNOSIS — Z87898 Personal history of other specified conditions: Secondary | ICD-10-CM | POA: Insufficient documentation

## 2021-03-08 DIAGNOSIS — N898 Other specified noninflammatory disorders of vagina: Secondary | ICD-10-CM

## 2021-03-08 DIAGNOSIS — B9689 Other specified bacterial agents as the cause of diseases classified elsewhere: Secondary | ICD-10-CM

## 2021-03-08 DIAGNOSIS — R102 Pelvic and perineal pain: Secondary | ICD-10-CM

## 2021-03-08 DIAGNOSIS — L719 Rosacea, unspecified: Secondary | ICD-10-CM | POA: Insufficient documentation

## 2021-03-08 LAB — WET PREP FOR TRICH, YEAST, CLUE

## 2021-03-08 MED ORDER — METRONIDAZOLE 0.75 % VA GEL: VAGINAL | 0 refills | Status: DC

## 2021-03-08 MED ORDER — MEDROXYPROGESTERONE ACETATE 5 MG PO TABS: 5.0000 mg | ORAL_TABLET | Freq: Every day | ORAL | 2 refills | Status: DC

## 2021-03-08 NOTE — Telephone Encounter (Signed)
Patient scheduled with Dr Thermon [place] on 03/17/21 at 10:50am

## 2021-03-08 NOTE — Patient Instructions (Signed)

## 2021-03-08 NOTE — Progress Notes (Signed)
GYNECOLOGY  VISIT   HPI: 55 y.o.   Divorced White or Caucasian Not Hispanic or Latino  female   [id number] with Patient's last menstrual period was 02/07/2021 (exact date).   here for  vaginal discharge. She  c/o a 2 week h/o stringing, brown d/c. She has noticed intermittent odor.  Her cycle was very heavy, hadn't had a cycle since 3/22. She had provera, didn't take.  Her August cycle lasted for 10 days, saturated a pad in up to 30 minutes.  She c/o a pressure/pain in her right mid to right lower quadrant, hurts in her right hip and right lower back. The pain has been going on for 2 months. Initially the pain was intermittent, now constant.  Feels like things are pushing on her uterus.   She also has some epigastric pain.   No change in her bowels, has a BM every 1-2 days.  She c/o urinary frequency over the last few weeks, voids normal amounts. No urgency, no dysuria.   GYNECOLOGIC HISTORY: Patient's last menstrual period was 02/07/2021 (exact date). Contraception:none Menopausal hormone therapy: none        OB History     Gravida  5   Para  3   Term      Preterm      AB  2   Living  3      SAB  2   IAB      Ectopic      Multiple      Live Births                 Patient Active Problem List   Diagnosis Date Noted   Hx of melanoma in situ 08/26/2020   HTN (hypertension) 08/07/2019   Alcohol dependence ([place]) 04/21/2018   Seasonal allergic rhinitis 04/21/2018   MDD (major depressive disorder), severe ([place]) 04/19/2018   Depression with anxiety 07/20/2017    Past Medical History:  Diagnosis Date   Abnormal Pap smear of cervix    Allergy    Anemia    completed iron   Anxiety    Cancer ([place])    melanoma   Closed fracture of left clavicle 05/05/2014   Depression    Dysmenorrhea    GERD (gastroesophageal reflux disease)    possibly per pt   Hyperlipidemia    Hypertension    Medical history non-contributory    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)     Migraine without aura     Past Surgical History:  Procedure Laterality Date   BREAST SURGERY  1995   lt br bx-neg   COLPOSCOPY     DILATION AND CURETTAGE OF UTERUS     x2post misscarage   MELANOMA EXCISION  2016   left lower leg   ORIF CLAVICULAR FRACTURE Left 05/05/2014   Procedure: OPEN REDUCTION INTERNAL FIXATION (ORIF) LEFT  CLAVICULAR FRACTURE;  Surgeon: Johnny Bridge, MD;  Location: [facility];  Service: Orthopedics;  Laterality: Left;   VEIN LIGATION AND STRIPPING     legs    Current Outpatient Medications  Medication Sig Dispense Refill   amLODipine (NORVASC) 10 MG tablet Take 1 tablet (10 mg total) by mouth daily. 30 tablet 3   Ascorbic Acid (VITAMIN C) 1000 MG tablet Take 1,000 mg by mouth daily.     Biotin w/ Vitamins C & E (HAIR/SKIN/NAILS PO) Take by mouth.     Chlorpheniramine Maleate (ALLERGY PO) Take by mouth.  COLLAGEN PO Take by mouth.     EVENING PRIMROSE OIL PO Take by mouth.     lisinopril (ZESTRIL) 10 MG tablet Take 1 tablet (10 mg total) by mouth daily. 30 tablet 3   LORazepam (ATIVAN) 1 MG tablet Take 1 tablet (1 mg total) by mouth every 8 (eight) hours as needed for anxiety. 90 tablet 5   metoprolol succinate (TOPROL-XL) 100 MG 24 hr tablet Take 1 tablet (100 mg total) by mouth daily. Take with or immediately following a meal. 90 tablet 3   Omega-3 Fatty Acids (FISH OIL PO) Take by mouth.     Probiotic Product (PROBIOTIC PO) Take by mouth. Womens probiotic daily     venlafaxine XR (EFFEXOR-XR) 150 MG 24 hr capsule Take 1 capsule (150 mg total) by mouth in the morning and at bedtime. 180 capsule 3   VITAMIN E PO Take by mouth daily.     No current facility-administered medications for this visit.     ALLERGIES: Codeine, Oxycodone, and Vicodin [hydrocodone-acetaminophen]  Family History  Problem Relation Age of Onset   Hypertension Father    Crohn's disease Other    Colon cancer Neg Hx    Colon polyps Neg Hx    Esophageal  cancer Neg Hx    Stomach cancer Neg Hx    Rectal cancer Neg Hx    Pancreatic cancer Neg Hx     Social History   Socioeconomic History   Marital status: Divorced    Spouse name: Not on file   Number of children: Not on file   Years of education: Not on file   Highest education level: Not on file  Occupational History   Not on file  Tobacco Use   Smoking status: Former    Types: Cigarettes    Quit date: 04/30/1984    Years since quitting: 36.8   Smokeless tobacco: Never  Vaping Use   Vaping Use: Never used  Substance and Sexual Activity   Alcohol use: Not Currently    Comment: 1 glasses wine per day   Drug use: No   Sexual activity: Yes    Birth control/protection: None  Other Topics Concern   Not on file  Social History Narrative   Not on file   Social Determinants of Health   Financial Resource Strain: Not on file  Food Insecurity: Not on file  Transportation Needs: Not on file  Physical Activity: Not on file  Stress: Not on file  Social Connections: Not on file  Intimate Partner Violence: Not on file    Review of Systems  Constitutional: Negative.   HENT: Negative.    Eyes: Negative.   Respiratory: Negative.    Cardiovascular: Negative.   Gastrointestinal: Negative.   Genitourinary:        Vaginal discharge & odor, abdominal pain  Musculoskeletal: Negative.   Skin: Negative.   Neurological: Negative.   Endo/Heme/Allergies: Negative.   Psychiatric/Behavioral: Negative.     PHYSICAL EXAMINATION:    BP 114/70   Pulse 77   Resp 16   Wt 175 lb (79.4 kg)   LMP 02/07/2021 (Exact Date)   BMI 29.12 kg/m     General appearance: alert, cooperative and appears stated age Abdomen: soft, diffusely tender BLQ, no rebound, no guarding,  non distended, no masses,  no organomegaly. She does have a non reducible umbilical hernia.   Pelvic: External genitalia:  no lesions              Urethra:  normal appearing urethra with no masses, tenderness or lesions               Bartholins and Skenes: normal                 Vagina: normal appearing vagina with normal color and discharge, no lesions              Cervix: no cervical motion tenderness and no lesions              Bimanual Exam:  Uterus:  normal size, contour, position, consistency, mobility, non-tender and anteverted              Adnexa: no mass, fullness, tenderness              Rectovaginal: Yes.  .  Confirms.              Anus:  normal sphincter tone, no lesions  The risks of endometrial biopsy were reviewed and a consent was obtained.  A speculum was placed in the vagina and the cervix was cleansed with Hibiclens.  A tenaculum was placed on the cervix and the pipelle was placed into the endometrial cavity. The uterus sounded to 7 cm. The endometrial biopsy was performed, taking care to get a representative sample, sampling 360 degrees of the uterine cavity. A moderate amount of tissue was obtained. The tenaculum and speculum were removed. There were no complications.    Chaperone was present for exam.  1. Abnormal uterine bleeding Suspect anovulatory bleed - Pregnancy, urine: negative - Surgical pathology( [facility]/ [facility]) - US Transvaginal Non-OB; Future - medroxyPROGESTERone (PROVERA) 5 MG tablet; Take 1 tablet (5 mg total) by mouth daily.  Dispense: 5 tablet; Refill: 2  2. Vaginal odor - WET PREP FOR TRICH, YEAST, CLUE: +BV - metroNIDAZOLE (METROGEL) 0.75 % vaginal gel; One applicator qhs x 5 days.  Dispense: 70 g; Refill: 0  3. Urinary frequency - Urinalysis,Complete w/RFL Culture: negative  4. Combined abdominal and pelvic pain I suspect some of her pain is MS. Discussed heat, ice and using ibuprofen - US Transvaginal Non-OB; Future  5. Umbilical hernia without obstruction and without gangrene Referral to General surgery  6. Bacterial vaginitis - metroNIDAZOLE (METROGEL) 0.75 % vaginal gel; One applicator qhs x 5 days.  Dispense: 70 g; Refill: 0

## 2021-03-08 NOTE — Telephone Encounter (Signed)
Staff message sent to central [facility] surgery referral coordinator to call and schedule.

## 2021-03-08 NOTE — Telephone Encounter (Signed)
-----   Message from Salvadore Dom, MD sent at 03/08/2021  9:57 AM EDT ----- Please place a referral to general surgery for an umbilical hernia. Thanks, Sharee Pimple

## 2021-03-09 ENCOUNTER — Encounter: Payer: Self-pay | Admitting: Obstetrics and Gynecology

## 2021-03-09 ENCOUNTER — Ambulatory Visit (INDEPENDENT_AMBULATORY_CARE_PROVIDER_SITE_OTHER): Payer: 59 | Admitting: Psychology

## 2021-03-09 DIAGNOSIS — F4323 Adjustment disorder with mixed anxiety and depressed mood: Secondary | ICD-10-CM | POA: Diagnosis not present

## 2021-03-09 LAB — SURGICAL PATHOLOGY

## 2021-03-09 NOTE — Telephone Encounter (Signed)
No, her current problem is being managed by Dr. Talbert Nan, so she would be the one to put her out of work

## 2021-03-10 ENCOUNTER — Ambulatory Visit (INDEPENDENT_AMBULATORY_CARE_PROVIDER_SITE_OTHER): Payer: 59

## 2021-03-10 ENCOUNTER — Other Ambulatory Visit: Payer: Self-pay

## 2021-03-10 DIAGNOSIS — R102 Pelvic and perineal pain: Secondary | ICD-10-CM

## 2021-03-10 DIAGNOSIS — N939 Abnormal uterine and vaginal bleeding, unspecified: Secondary | ICD-10-CM | POA: Diagnosis not present

## 2021-03-10 DIAGNOSIS — R109 Unspecified abdominal pain: Secondary | ICD-10-CM

## 2021-03-10 LAB — URINALYSIS, COMPLETE W/RFL CULTURE
Bilirubin Urine: NEGATIVE
Crystals: NONE SEEN /HPF
Glucose, UA: NEGATIVE
Hgb urine dipstick: NEGATIVE
Ketones, ur: NEGATIVE
Leukocyte Esterase: NEGATIVE
Nitrites, Initial: NEGATIVE
Protein, ur: NEGATIVE
RBC / HPF: NONE SEEN /HPF (ref 0–2)
Specific Gravity, Urine: 1.025 (ref 1.001–1.035)
Yeast: NONE SEEN /HPF
pH: 5.5 (ref 5.0–8.0)

## 2021-03-10 LAB — URINE CULTURE
MICRO NUMBER:: 12334768
SPECIMEN QUALITY:: ADEQUATE

## 2021-03-10 LAB — CULTURE INDICATED

## 2021-03-10 LAB — PREGNANCY, URINE: Preg Test, Ur: NEGATIVE

## 2021-03-17 ENCOUNTER — Ambulatory Visit: Payer: 59 | Admitting: Psychology

## 2021-03-18 ENCOUNTER — Ambulatory Visit: Payer: 59 | Admitting: Psychology

## 2021-03-20 ENCOUNTER — Other Ambulatory Visit: Payer: Self-pay | Admitting: Family Medicine

## 2021-03-20 DIAGNOSIS — I10 Essential (primary) hypertension: Secondary | ICD-10-CM

## 2021-03-22 ENCOUNTER — Other Ambulatory Visit: Payer: Self-pay | Admitting: Surgery

## 2021-03-22 DIAGNOSIS — K429 Umbilical hernia without obstruction or gangrene: Secondary | ICD-10-CM

## 2021-03-31 ENCOUNTER — Ambulatory Visit (INDEPENDENT_AMBULATORY_CARE_PROVIDER_SITE_OTHER): Payer: 59 | Admitting: Psychology

## 2021-03-31 DIAGNOSIS — F4323 Adjustment disorder with mixed anxiety and depressed mood: Secondary | ICD-10-CM

## 2021-04-04 ENCOUNTER — Encounter: Payer: 59 | Admitting: Internal Medicine

## 2021-04-07 ENCOUNTER — Other Ambulatory Visit: Payer: Self-pay | Admitting: Family Medicine

## 2021-04-08 ENCOUNTER — Ambulatory Visit
Admission: RE | Admit: 2021-04-08 | Discharge: 2021-04-08 | Disposition: A | Discharge status: Home / Self Care | Payer: 59 | Source: Ambulatory Visit | Attending: Surgery | Admitting: Surgery

## 2021-04-08 ENCOUNTER — Other Ambulatory Visit: Payer: Self-pay

## 2021-04-08 DIAGNOSIS — K429 Umbilical hernia without obstruction or gangrene: Secondary | ICD-10-CM

## 2021-04-08 MED ORDER — IOPAMIDOL (ISOVUE-300) INJECTION 61%
100.0000 mL | Freq: Once | INTRAVENOUS | Status: AC | PRN
  Administered 2021-04-08: 100 mL via INTRAVENOUS

## 2021-04-13 ENCOUNTER — Encounter: Payer: Self-pay | Admitting: Family Medicine

## 2021-04-13 DIAGNOSIS — N6310 Unspecified lump in the right breast, unspecified quadrant: Secondary | ICD-10-CM

## 2021-04-13 NOTE — Telephone Encounter (Signed)
I ordered the diagnostic mammogram as well as a breast US. They will call her to schedule

## 2021-04-14 ENCOUNTER — Ambulatory Visit: Payer: 59 | Admitting: Psychology

## 2021-04-15 ENCOUNTER — Other Ambulatory Visit: Payer: Self-pay | Admitting: Family Medicine

## 2021-04-25 ENCOUNTER — Ambulatory Visit
Admission: RE | Admit: 2021-04-25 | Discharge: 2021-04-25 | Disposition: A | Discharge status: Home / Self Care | Payer: 59 | Source: Ambulatory Visit | Attending: Family Medicine | Admitting: Family Medicine

## 2021-04-25 ENCOUNTER — Other Ambulatory Visit: Payer: Self-pay

## 2021-04-25 DIAGNOSIS — N6310 Unspecified lump in the right breast, unspecified quadrant: Secondary | ICD-10-CM

## 2021-04-27 ENCOUNTER — Ambulatory Visit: Payer: 59

## 2021-05-03 ENCOUNTER — Ambulatory Visit (INDEPENDENT_AMBULATORY_CARE_PROVIDER_SITE_OTHER): Payer: 59 | Admitting: Psychology

## 2021-05-03 ENCOUNTER — Other Ambulatory Visit: Payer: Self-pay

## 2021-05-03 DIAGNOSIS — F4323 Adjustment disorder with mixed anxiety and depressed mood: Secondary | ICD-10-CM

## 2021-05-18 ENCOUNTER — Ambulatory Visit (INDEPENDENT_AMBULATORY_CARE_PROVIDER_SITE_OTHER): Payer: 59 | Admitting: Psychology

## 2021-05-18 DIAGNOSIS — F4323 Adjustment disorder with mixed anxiety and depressed mood: Secondary | ICD-10-CM

## 2021-05-23 ENCOUNTER — Encounter: Payer: Self-pay | Admitting: Obstetrics and Gynecology

## 2021-05-23 NOTE — Telephone Encounter (Signed)
Patient had u/s on 03/10/21.    Impression:  Anteverted uterus Suspected adenomyosis 2 small intramural myoma Normal ovaries bilaterally.

## 2021-06-01 ENCOUNTER — Other Ambulatory Visit: Payer: Self-pay

## 2021-06-01 ENCOUNTER — Encounter: Payer: Self-pay | Admitting: Obstetrics and Gynecology

## 2021-06-01 ENCOUNTER — Ambulatory Visit (INDEPENDENT_AMBULATORY_CARE_PROVIDER_SITE_OTHER): Payer: 59 | Admitting: Obstetrics and Gynecology

## 2021-06-01 VITALS — BP 122/82 | HR 87 | Ht 65.0 in | Wt 170.0 lb

## 2021-06-01 DIAGNOSIS — N951 Menopausal and female climacteric states: Secondary | ICD-10-CM

## 2021-06-01 DIAGNOSIS — N939 Abnormal uterine and vaginal bleeding, unspecified: Secondary | ICD-10-CM

## 2021-06-01 DIAGNOSIS — Z3009 Encounter for other general counseling and advice on contraception: Secondary | ICD-10-CM

## 2021-06-01 LAB — CBC
HCT: 41.4 % (ref 35.0–45.0)
Hemoglobin: 13.9 g/dL (ref 11.7–15.5)
MCH: 32.4 pg (ref 27.0–33.0)
MCHC: 33.6 g/dL (ref 32.0–36.0)
MCV: 96.5 fL (ref 80.0–100.0)
MPV: 10.4 fL (ref 7.5–12.5)
Platelets: 267 10*3/uL (ref 140–400)
RBC: 4.29 10*6/uL (ref 3.80–5.10)
RDW: 12 % (ref 11.0–15.0)
WBC: 5.9 10*3/uL (ref 3.8–10.8)

## 2021-06-01 LAB — PREGNANCY, URINE: Preg Test, Ur: NEGATIVE

## 2021-06-01 LAB — FERRITIN: Ferritin: 22 ng/mL (ref 16–232)

## 2021-06-01 MED ORDER — MEDROXYPROGESTERONE ACETATE 10 MG PO TABS
10.0000 mg | ORAL_TABLET | Freq: Every day | ORAL | 0 refills | Status: DC
Start: 2021-06-01 — End: 2021-08-09

## 2021-06-01 NOTE — Progress Notes (Signed)
GYNECOLOGY  VISIT   HPI: 55 y.o.   Divorced White or Caucasian Not Hispanic or Latino  female   [phone] with No LMP recorded. (Menstrual status: Irregular Periods).   here for irregular bleeding. She states that she has been bleeding on and off since September. She says that some times the blood is dark and some times its bright red and it can be heavy at time. When it is heavy she is soaking a pad every 30 min.     She had a benign endometrial biopsy in 9/22.  Ultrasound from 03/10/21: Uterus 10.35 x 6.43 x 5.07 Heterogeneous myometrium with streaky shadowing suggestive of adenomyosis Fibroids: 1) 2.3 x 1.81 cm, posterior/intramural, 2) 2.02 x 2.28 cm, posterior/intramural Endometrium 5.15 mm, thin, symmetrical, no masses Left ovary 2.85 x 2.3 x 1.98 cm Right ovary 2.23 x 2.17 x 1.51 cm No free fluid Impression:  Anteverted uterus Suspected adenomyosis 2 small intramural myoma Normal ovaries bilaterally  Since 9/22 she had a heavy 9 day cycle. In the last month she has bleed daily, anywhere from spotting to heavy.   She is sexually active, no contraception.   GYNECOLOGIC HISTORY: No LMP recorded. (Menstrual status: Irregular Periods). Contraception:yes  Menopausal hormone therapy: none.        OB History     Gravida  5   Para  3   Term      Preterm      AB  2   Living  3      SAB  2   IAB      Ectopic      Multiple      Live Births                 Patient Active Problem List   Diagnosis Date Noted   Personal history of other specified conditions 03/08/2021   Rosacea 03/08/2021   Seborrheic keratosis 03/08/2021   Hx of melanoma in situ 08/26/2020   HTN (hypertension) 08/07/2019   Alcohol dependence ([place]) 04/21/2018   Seasonal allergic rhinitis 04/21/2018   MDD (major depressive disorder), severe ([place]) 04/19/2018   Depression with anxiety 07/20/2017    Past Medical History:  Diagnosis Date   Abnormal Pap smear of cervix    Allergy    Anemia     completed iron   Anxiety    Cancer ([place])    melanoma   Closed fracture of left clavicle 05/05/2014   Depression    Dysmenorrhea    GERD (gastroesophageal reflux disease)    possibly per pt   Hyperlipidemia    Hypertension    Medical history non-contributory    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)    Migraine without aura     Past Surgical History:  Procedure Laterality Date   BREAST SURGERY  1995   lt br bx-neg   COLPOSCOPY     DILATION AND CURETTAGE OF UTERUS     x2post misscarage   MELANOMA EXCISION  2016   left lower leg   ORIF CLAVICULAR FRACTURE Left 05/05/2014   Procedure: OPEN REDUCTION INTERNAL FIXATION (ORIF) LEFT  CLAVICULAR FRACTURE;  Surgeon: Johnny Bridge, MD;  Location: [facility];  Service: Orthopedics;  Laterality: Left;   VEIN LIGATION AND STRIPPING     legs    Current Outpatient Medications  Medication Sig Dispense Refill   amLODipine (NORVASC) 10 MG tablet TAKE ONE TABLET BY MOUTH ONE TIME DAILY 30 tablet 3   Ascorbic  Acid (VITAMIN C) 1000 MG tablet Take 1,000 mg by mouth daily.     Biotin w/ Vitamins C & E (HAIR/SKIN/NAILS PO) Take by mouth.     Chlorpheniramine Maleate (ALLERGY PO) Take by mouth.     COLLAGEN PO Take by mouth.     EVENING PRIMROSE OIL PO Take by mouth.     lisinopril (ZESTRIL) 10 MG tablet TAKE ONE TABLET BY MOUTH ONE TIME DAILY 30 tablet 3   LORazepam (ATIVAN) 1 MG tablet Take 1 tablet (1 mg total) by mouth every 8 (eight) hours as needed for anxiety. 90 tablet 5   medroxyPROGESTERone (PROVERA) 5 MG tablet Take 1 tablet (5 mg total) by mouth daily. 5 tablet 2   metoprolol succinate (TOPROL-XL) 100 MG 24 hr tablet TAKE ONE TABLET BY MOUTH ONE TIME DAILY with or immediately following a meal 90 tablet 0   metroNIDAZOLE (METROGEL) 0.75 % vaginal gel One applicator qhs x 5 days. 70 g 0   Omega-3 Fatty Acids (FISH OIL PO) Take by mouth.     Probiotic Product (PROBIOTIC PO) Take by mouth. Womens probiotic daily      venlafaxine XR (EFFEXOR-XR) 150 MG 24 hr capsule Take 1 capsule (150 mg total) by mouth in the morning and at bedtime. 180 capsule 3   VITAMIN E PO Take by mouth daily.     No current facility-administered medications for this visit.     ALLERGIES: Codeine, Oxycodone, and Vicodin [hydrocodone-acetaminophen]  Family History  Problem Relation Age of Onset   Hypertension Father    Crohn's disease Other    Colon cancer Neg Hx    Colon polyps Neg Hx    Esophageal cancer Neg Hx    Stomach cancer Neg Hx    Rectal cancer Neg Hx    Pancreatic cancer Neg Hx     Social History   Socioeconomic History   Marital status: Divorced    Spouse name: Not on file   Number of children: Not on file   Years of education: Not on file   Highest education level: Not on file  Occupational History   Not on file  Tobacco Use   Smoking status: Former    Types: Cigarettes    Quit date: 04/30/1984    Years since quitting: 37.1   Smokeless tobacco: Never  Vaping Use   Vaping Use: Never used  Substance and Sexual Activity   Alcohol use: Not Currently    Comment: 1 glasses wine per day   Drug use: No   Sexual activity: Yes    Birth control/protection: None  Other Topics Concern   Not on file  Social History Narrative   Not on file   Social Determinants of Health   Financial Resource Strain: Not on file  Food Insecurity: Not on file  Transportation Needs: Not on file  Physical Activity: Not on file  Stress: Not on file  Social Connections: Not on file  Intimate Partner Violence: Not on file    Review of Systems  All other systems reviewed and are negative.  PHYSICAL EXAMINATION:    BP 122/82   Pulse 87   Ht 5\' 5"  (1.651 m)   Wt 170 lb (77.1 kg)   SpO2 100%   BMI 28.29 kg/m     General appearance: alert, cooperative and appears stated age  2. Abnormal uterine bleeding Normal TSH earlier this year. Negative endometrial biopsy in 9/22. Ultrasound in 9/22 with suspected adenomyosis  and 2 small intramural myomas.  -  medroxyPROGESTERone (PROVERA) 10 MG tablet; Take 1 tablet (10 mg total) by mouth daily.  Dispense: 10 tablet; Refill: 0 -We discussed options of the mini-pill and the mirena IUD (she isn't using contraception). -She would like the mirena IUD, risks reviewed, information given. -She will return for IUD insertion after the provera w/d - IUD Insertion; Future - Pregnancy, urine - CBC (prior h/o anemia) - Ferritin  2. Perimenopausal  3. General counseling and advice on female contraception See above.  - IUD Insertion; Future

## 2021-06-06 ENCOUNTER — Ambulatory Visit: Payer: 59 | Admitting: Family Medicine

## 2021-06-07 ENCOUNTER — Other Ambulatory Visit: Payer: Self-pay | Admitting: Obstetrics and Gynecology

## 2021-06-07 DIAGNOSIS — N939 Abnormal uterine and vaginal bleeding, unspecified: Secondary | ICD-10-CM

## 2021-06-08 NOTE — Telephone Encounter (Signed)
Refill request for Provera 10 mg tab received from [facility].   Spoke with patient. Patient states refill requested in error, no refill needed. Patient aware to call if any questions/concerns.   Refill denied.  Encounter closed.

## 2021-06-09 ENCOUNTER — Ambulatory Visit: Payer: 59 | Admitting: Psychology

## 2021-06-18 ENCOUNTER — Other Ambulatory Visit: Payer: Self-pay | Admitting: Family Medicine

## 2021-06-18 DIAGNOSIS — I10 Essential (primary) hypertension: Secondary | ICD-10-CM

## 2021-06-22 ENCOUNTER — Encounter: Payer: Self-pay | Admitting: Obstetrics and Gynecology

## 2021-06-22 ENCOUNTER — Ambulatory Visit (INDEPENDENT_AMBULATORY_CARE_PROVIDER_SITE_OTHER): Payer: 59 | Admitting: Obstetrics and Gynecology

## 2021-06-22 ENCOUNTER — Other Ambulatory Visit: Payer: Self-pay

## 2021-06-22 VITALS — BP 120/72 | HR 67 | Ht 65.0 in | Wt 171.0 lb

## 2021-06-22 DIAGNOSIS — Z3009 Encounter for other general counseling and advice on contraception: Secondary | ICD-10-CM

## 2021-06-22 DIAGNOSIS — Z30430 Encounter for insertion of intrauterine contraceptive device: Secondary | ICD-10-CM | POA: Diagnosis not present

## 2021-06-22 DIAGNOSIS — N939 Abnormal uterine and vaginal bleeding, unspecified: Secondary | ICD-10-CM

## 2021-06-22 LAB — PREGNANCY, URINE: Preg Test, Ur: NEGATIVE

## 2021-06-22 NOTE — Patient Instructions (Signed)
IUD Post-procedure Instructions Cramping is common.  You may take Ibuprofen, Aleve, or Tylenol for the cramping.  This should resolve within 24 hours.   You may have a small amount of spotting.  You should wear a mini pad for the next few days. You may have intercourse in 24 hours. You need to call the office if you have any pelvic pain, fever, heavy bleeding, or foul smelling vaginal discharge. Shower or bathe as normal Use back up contraception for one week 

## 2021-06-22 NOTE — Progress Notes (Signed)
GYNECOLOGY  VISIT   HPI: 55 y.o.   Divorced White or Caucasian Not Hispanic or Latino  female   [phone] with Patient's last menstrual period was 06/14/2021.   here for Mirena IUD insertion.   The patient has a h/o AUB, negative endometrial biopsy, U/S with suspected adenomyosis and 2 small intramural myomas. She was treated with provera starting on 06/01/21 and is just ending her cycle. She took 400 mg of Ibuprofen at 5am.  She is also sexually active without contraception.    GYNECOLOGIC HISTORY: Patient's last menstrual period was 06/14/2021. Contraception:none  Menopausal hormone therapy: none         OB History     Gravida  5   Para  3   Term      Preterm      AB  2   Living  3      SAB  2   IAB      Ectopic      Multiple      Live Births                 Patient Active Problem List   Diagnosis Date Noted   Personal history of other specified conditions 03/08/2021   Rosacea 03/08/2021   Seborrheic keratosis 03/08/2021   Hx of melanoma in situ 08/26/2020   HTN (hypertension) 08/07/2019   Alcohol dependence ([place]) 04/21/2018   Seasonal allergic rhinitis 04/21/2018   MDD (major depressive disorder), severe ([place]) 04/19/2018   Depression with anxiety 07/20/2017    Past Medical History:  Diagnosis Date   Abnormal Pap smear of cervix    Allergy    Anemia    completed iron   Anxiety    Cancer ([place])    melanoma   Closed fracture of left clavicle 05/05/2014   Depression    Dysmenorrhea    GERD (gastroesophageal reflux disease)    possibly per pt   Hyperlipidemia    Hypertension    Medical history non-contributory    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)    Migraine without aura     Past Surgical History:  Procedure Laterality Date   BREAST SURGERY  1995   lt br bx-neg   COLPOSCOPY     DILATION AND CURETTAGE OF UTERUS     x2post misscarage   MELANOMA EXCISION  2016   left lower leg   ORIF CLAVICULAR FRACTURE Left 05/05/2014    Procedure: OPEN REDUCTION INTERNAL FIXATION (ORIF) LEFT  CLAVICULAR FRACTURE;  Surgeon: Johnny Bridge, MD;  Location: [facility];  Service: Orthopedics;  Laterality: Left;   VEIN LIGATION AND STRIPPING     legs    Current Outpatient Medications  Medication Sig Dispense Refill   amLODipine (NORVASC) 10 MG tablet TAKE ONE TABLET BY MOUTH ONE TIME DAILY 30 tablet 3   Ascorbic Acid (VITAMIN C) 1000 MG tablet Take 1,000 mg by mouth daily.     Biotin w/ Vitamins C & E (HAIR/SKIN/NAILS PO) Take by mouth.     Chlorpheniramine Maleate (ALLERGY PO) Take by mouth.     COLLAGEN PO Take by mouth.     EVENING PRIMROSE OIL PO Take by mouth.     lisinopril (ZESTRIL) 10 MG tablet TAKE ONE TABLET BY MOUTH ONE TIME DAILY 30 tablet 3   LORazepam (ATIVAN) 1 MG tablet Take 1 tablet (1 mg total) by mouth every 8 (eight) hours as needed for anxiety. 90 tablet 5   medroxyPROGESTERone (PROVERA)  10 MG tablet Take 1 tablet (10 mg total) by mouth daily. 10 tablet 0   medroxyPROGESTERone (PROVERA) 5 MG tablet Take 1 tablet (5 mg total) by mouth daily. 5 tablet 2   metoprolol succinate (TOPROL-XL) 100 MG 24 hr tablet TAKE ONE TABLET BY MOUTH ONE TIME DAILY WITH A MEAL OR IMMEDIATELY FOLLOWING A MEAL 90 tablet 0   metroNIDAZOLE (METROGEL) 0.75 % vaginal gel One applicator qhs x 5 days. 70 g 0   Omega-3 Fatty Acids (FISH OIL PO) Take by mouth.     Probiotic Product (PROBIOTIC PO) Take by mouth. Womens probiotic daily     venlafaxine XR (EFFEXOR-XR) 150 MG 24 hr capsule Take 1 capsule (150 mg total) by mouth in the morning and at bedtime. 180 capsule 3   VITAMIN E PO Take by mouth daily.     No current facility-administered medications for this visit.     ALLERGIES: Codeine, Oxycodone, and Vicodin [hydrocodone-acetaminophen]  Family History  Problem Relation Age of Onset   Hypertension Father    Crohn's disease Other    Colon cancer Neg Hx    Colon polyps Neg Hx    Esophageal cancer Neg Hx     Stomach cancer Neg Hx    Rectal cancer Neg Hx    Pancreatic cancer Neg Hx     Social History   Socioeconomic History   Marital status: Divorced    Spouse name: Not on file   Number of children: Not on file   Years of education: Not on file   Highest education level: Not on file  Occupational History   Not on file  Tobacco Use   Smoking status: Former    Types: Cigarettes    Quit date: 04/30/1984    Years since quitting: 37.1   Smokeless tobacco: Never  Vaping Use   Vaping Use: Never used  Substance and Sexual Activity   Alcohol use: Not Currently    Comment: 1 glasses wine per day   Drug use: No   Sexual activity: Yes    Birth control/protection: None  Other Topics Concern   Not on file  Social History Narrative   Not on file   Social Determinants of Health   Financial Resource Strain: Not on file  Food Insecurity: Not on file  Transportation Needs: Not on file  Physical Activity: Not on file  Stress: Not on file  Social Connections: Not on file  Intimate Partner Violence: Not on file    ROS  PHYSICAL EXAMINATION:    BP 120/72    Pulse 67    Ht 5\' 5"  (1.651 m)    Wt 171 lb (77.6 kg)    LMP 06/14/2021    SpO2 99%    BMI 28.46 kg/m     General appearance: alert, cooperative and appears stated age  Pelvic: External genitalia:  no lesions              Urethra:  normal appearing urethra with no masses, tenderness or lesions              Bartholins and Skenes: normal                 Vagina: normal appearing vagina with normal color and discharge, no lesions              Cervix: no lesions                The risks of the mirena IUD were reviewed  with the patient, including infection, abnormal bleeding and uterine perfortion. Consent was signed.  A speculum was placed in the vagina, the cervix was cleansed with betadine. A tenaculum was placed on the cervix, the uterus sounded to 10 cm. The cervix was dilated to a 5 hagar dilator  The mirena IUD was inserted  without difficulty. The string were cut to 3 cm.    The patient tolerated the procedure well.   Chaperone was present for exam.  1. Encounter for insertion of mirena IUD IUD inserted F/U in one month  2. Abnormal uterine bleeding Suspected adenomyosis, perimenopausal - IUD Insertion - Pregnancy, urine  3. General counseling and advice on female contraception Needs contraception - IUD Insertion

## 2021-06-28 ENCOUNTER — Telehealth: Payer: Self-pay | Admitting: Family Medicine

## 2021-06-28 ENCOUNTER — Telehealth (INDEPENDENT_AMBULATORY_CARE_PROVIDER_SITE_OTHER): Payer: 59 | Admitting: Family Medicine

## 2021-06-28 ENCOUNTER — Encounter: Payer: Self-pay | Admitting: Family Medicine

## 2021-06-28 DIAGNOSIS — J40 Bronchitis, not specified as acute or chronic: Secondary | ICD-10-CM | POA: Diagnosis not present

## 2021-06-28 MED ORDER — METHYLPREDNISOLONE 4 MG PO TBPK: ORAL_TABLET | ORAL | 0 refills | Status: DC

## 2021-06-28 MED ORDER — AZITHROMYCIN 250 MG PO TABS: ORAL_TABLET | ORAL | 0 refills | Status: DC

## 2021-06-28 NOTE — Progress Notes (Signed)
Subjective:    Patient ID: Kim Elliott, female    DOB: 06-02-1966, 55 y.o.   MRN: [id number]  HPI Virtual Visit via Video Note  I connected with the patient on 06/28/21 at 11:00 AM EST by a video enabled telemedicine application and verified that I am speaking with the correct person using two identifiers.  Location patient: home Location provider:work or home office Persons participating in the virtual visit: patient, provider  I discussed the limitations of evaluation and management by telemedicine and the availability of in person appointments. The patient expressed understanding and agreed to proceed.   HPI: Here for an illness that started 6 days ago. She has had night sweats, body aches, headache, and a dry cough. Now since yesterday she also has burning and congestion in the chest. No SOB. No NVD. She is drinking fluids and taking Ibuprofen. She has not been tested for either flu or the Covid-19 virus.    ROS: See pertinent positives and negatives per HPI.  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)    Migraine without aura     Past Surgical History:  Procedure Laterality Date   BREAST SURGERY  1995   lt br bx-neg   COLPOSCOPY     DILATION AND CURETTAGE OF UTERUS     x2post misscarage   MELANOMA EXCISION  2016   left lower leg   ORIF CLAVICULAR FRACTURE Left 05/05/2014   Procedure: OPEN REDUCTION INTERNAL FIXATION (ORIF) LEFT  CLAVICULAR FRACTURE;  Surgeon: Johnny Bridge, MD;  Location: [facility];  Service: Orthopedics;  Laterality: Left;   VEIN LIGATION AND STRIPPING     legs     Family History  Problem Relation Age of Onset   Hypertension Father    Crohn's disease Other    Colon cancer Neg Hx    Colon polyps Neg Hx    Esophageal cancer Neg Hx    Stomach cancer Neg Hx    Rectal cancer Neg Hx    Pancreatic cancer Neg Hx      Current Outpatient Medications:    amLODipine (NORVASC) 10 MG tablet, TAKE ONE TABLET BY MOUTH ONE TIME DAILY, Disp: 30 tablet, Rfl: 3   Ascorbic Acid (VITAMIN C) 1000 MG tablet, Take 1,000 mg by mouth daily., Disp: , Rfl:    azithromycin (ZITHROMAX Z-PAK) 250 MG tablet, As directed, Disp: 6 each, Rfl: 0   Biotin w/ Vitamins C & E (HAIR/SKIN/NAILS PO), Take by mouth., Disp: , Rfl:    Chlorpheniramine Maleate (ALLERGY PO), Take by mouth., Disp: , Rfl:    COLLAGEN PO, Take by mouth., Disp: , Rfl:    EVENING PRIMROSE OIL PO, Take by mouth., Disp: , Rfl:    lisinopril (ZESTRIL) 10 MG tablet, TAKE ONE TABLET BY MOUTH ONE TIME DAILY, Disp: 30 tablet, Rfl: 3   LORazepam (ATIVAN) 1 MG tablet, Take 1 tablet (1 mg total) by mouth every 8 (eight) hours as needed for anxiety., Disp: 90 tablet, Rfl: 5  medroxyPROGESTERone (PROVERA) 10 MG tablet, Take 1 tablet (10 mg total) by mouth daily., Disp: 10 tablet, Rfl: 0   medroxyPROGESTERone (PROVERA) 5 MG tablet, Take 1 tablet (5 mg total) by mouth daily., Disp: 5 tablet, Rfl: 2   methylPREDNISolone (MEDROL DOSEPAK) 4 MG TBPK tablet, As directed, Disp: 21 tablet, Rfl: 0   metoprolol succinate (TOPROL-XL) 100 MG 24 hr tablet, TAKE ONE TABLET BY MOUTH ONE TIME DAILY WITH A MEAL OR IMMEDIATELY FOLLOWING A MEAL, Disp: 90 tablet, Rfl: 0   metroNIDAZOLE (METROGEL) 0.75 % vaginal gel, One applicator qhs x 5 days., Disp: 70 g, Rfl: 0   Omega-3 Fatty Acids (FISH OIL PO), Take by mouth., Disp: , Rfl:    Probiotic Product (PROBIOTIC PO), Take by mouth. Womens probiotic daily, Disp: , Rfl:    venlafaxine XR (EFFEXOR-XR) 150 MG 24 hr capsule, Take 1 capsule (150 mg total) by mouth in the morning and at bedtime.,  Disp: 180 capsule, Rfl: 3   VITAMIN E PO, Take by mouth daily., Disp: , Rfl:   EXAM:  VITALS per patient if applicable:  GENERAL: alert, oriented, appears well and in no acute distress  HEENT: atraumatic, conjunttiva clear, no obvious abnormalities on inspection of external nose and ears  NECK: normal movements of the head and neck  LUNGS: on inspection no signs of respiratory distress, breathing rate appears normal, no obvious gross SOB, gasping or wheezing  CV: no obvious cyanosis  MS: moves all visible extremities without noticeable abnormality  PSYCH/NEURO: pleasant and cooperative, no obvious depression or anxiety, speech and thought processing grossly intact  ASSESSMENT AND PLAN: It sounds like she started with a viral illness that has now settled into a bronchitis. We will treat with a Zpack and a Medrol dose pack. Recheck as needed.  Alysia Penna, MD  Discussed the following assessment and plan:  No diagnosis found.     I discussed the assessment and treatment plan with the patient. The patient was provided an opportunity to ask questions and all were answered. The patient agreed with the plan and demonstrated an understanding of the instructions.   The patient was advised to call back or seek an in-person evaluation if the symptoms worsen or if the condition fails to improve as anticipated.      Review of Systems     Objective:   Physical Exam        Assessment & Plan:

## 2021-06-28 NOTE — Telephone Encounter (Signed)
Patient calling in with respiratory symptoms: Shortness of breath, chest pain, palpitations or other red words send to Triage  Does the patient have a fever over 100, cough, congestion, sore throat, runny nose, lost of taste/smell (please list symptoms that patient has)?chest and nasal congestion, bodyaches  What date did symptoms start?06-23-2021 (If over 5 days ago, pt may be scheduled for in person visit)  Have you tested for Covid in the last 5 days? No   If yes, was it positive []  OR negative [] ? If positive in the last 5 days, please schedule virtual visit now. If negative, schedule for an in person OV with the next available provider if PCP has no openings. Please also let patient know they will be tested again (follow the script below)  "you will have to arrive 80mins prior to your appt time to be Covid tested. Please park in back of office at the cone & call [phone] to let the staff know you have arrived. A staff member will meet you at your car to do a rapid covid test. Once the test has resulted you will be notified by phone of your results to determine if appt will remain an in person visit or be converted to a virtual/phone visit. If you arrive less than 43mins before your appt time, your visit will be automatically converted to virtual & any recommended testing will happen AFTER the visit." Pt has virtual appt with dr fry on 06-28-2021 at 11 am  [place]  If no availability for virtual visit in office,  please schedule another [place] office  If no availability at another [place] office, please instruct patient that they can schedule an evisit or virtual visit through their mychart account. Visits up to 8pm  patients can be seen in office 5 days after positive COVID test

## 2021-07-08 ENCOUNTER — Ambulatory Visit: Payer: 59 | Admitting: Psychology

## 2021-07-22 ENCOUNTER — Encounter: Payer: Self-pay | Admitting: Obstetrics and Gynecology

## 2021-07-22 ENCOUNTER — Ambulatory Visit (INDEPENDENT_AMBULATORY_CARE_PROVIDER_SITE_OTHER): Payer: 59 | Admitting: Obstetrics and Gynecology

## 2021-07-22 ENCOUNTER — Other Ambulatory Visit: Payer: Self-pay

## 2021-07-22 VITALS — BP 122/76 | HR 78 | Wt 174.0 lb

## 2021-07-22 DIAGNOSIS — Z30431 Encounter for routine checking of intrauterine contraceptive device: Secondary | ICD-10-CM

## 2021-07-22 NOTE — Progress Notes (Signed)
GYNECOLOGY  VISIT   HPI: 56 y.o.   Divorced White or Caucasian Not Hispanic or Latino  female   [phone] with No LMP recorded. (Menstrual status: Irregular Periods).   here for IUD follow up. She had a mirena IUD inserted on 06/22/21 for AUB and contraception. Patient states that she had a lot of pressure after insertion but is fine now. She is still spotting.   GYNECOLOGIC HISTORY: No LMP recorded. (Menstrual status: Irregular Periods). Contraception:IUD  Menopausal hormone therapy: none         OB History     Gravida  5   Para  3   Term      Preterm      AB  2   Living  3      SAB  2   IAB      Ectopic      Multiple      Live Births                 Patient Active Problem List   Diagnosis Date Noted   Personal history of other specified conditions 03/08/2021   Rosacea 03/08/2021   Seborrheic keratosis 03/08/2021   Hx of melanoma in situ 08/26/2020   HTN (hypertension) 08/07/2019   Alcohol dependence ([place]) 04/21/2018   Seasonal allergic rhinitis 04/21/2018   MDD (major depressive disorder), severe ([place]) 04/19/2018   Depression with anxiety 07/20/2017    Past Medical History:  Diagnosis Date   Abnormal Pap smear of cervix    Allergy    Anemia    completed iron   Anxiety    Cancer ([place])    melanoma   Closed fracture of left clavicle 05/05/2014   Depression    Dysmenorrhea    GERD (gastroesophageal reflux disease)    possibly per pt   Hyperlipidemia    Hypertension    Medical history non-contributory    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)    Migraine without aura     Past Surgical History:  Procedure Laterality Date   BREAST SURGERY  1995   lt br bx-neg   COLPOSCOPY     DILATION AND CURETTAGE OF UTERUS     x2post misscarage   MELANOMA EXCISION  2016   left lower leg   ORIF CLAVICULAR FRACTURE Left 05/05/2014   Procedure: OPEN REDUCTION INTERNAL FIXATION (ORIF) LEFT  CLAVICULAR FRACTURE;  Surgeon: Johnny Bridge, MD;  Location:  [facility];  Service: Orthopedics;  Laterality: Left;   VEIN LIGATION AND STRIPPING     legs    Current Outpatient Medications  Medication Sig Dispense Refill   amLODipine (NORVASC) 10 MG tablet TAKE ONE TABLET BY MOUTH ONE TIME DAILY 30 tablet 3   Ascorbic Acid (VITAMIN C) 1000 MG tablet Take 1,000 mg by mouth daily.     azithromycin (ZITHROMAX Z-PAK) 250 MG tablet As directed 6 each 0   Biotin w/ Vitamins C & E (HAIR/SKIN/NAILS PO) Take by mouth.     Chlorpheniramine Maleate (ALLERGY PO) Take by mouth.     COLLAGEN PO Take by mouth.     EVENING PRIMROSE OIL PO Take by mouth.     lisinopril (ZESTRIL) 10 MG tablet TAKE ONE TABLET BY MOUTH ONE TIME DAILY 30 tablet 3   LORazepam (ATIVAN) 1 MG tablet Take 1 tablet (1 mg total) by mouth every 8 (eight) hours as needed for anxiety. 90 tablet 5   medroxyPROGESTERone (PROVERA) 10 MG tablet Take 1  tablet (10 mg total) by mouth daily. 10 tablet 0   medroxyPROGESTERone (PROVERA) 5 MG tablet Take 1 tablet (5 mg total) by mouth daily. 5 tablet 2   methylPREDNISolone (MEDROL DOSEPAK) 4 MG TBPK tablet As directed 21 tablet 0   metoprolol succinate (TOPROL-XL) 100 MG 24 hr tablet TAKE ONE TABLET BY MOUTH ONE TIME DAILY WITH A MEAL OR IMMEDIATELY FOLLOWING A MEAL 90 tablet 0   metroNIDAZOLE (METROGEL) 0.75 % vaginal gel One applicator qhs x 5 days. 70 g 0   Omega-3 Fatty Acids (FISH OIL PO) Take by mouth.     Probiotic Product (PROBIOTIC PO) Take by mouth. Womens probiotic daily     venlafaxine XR (EFFEXOR-XR) 150 MG 24 hr capsule Take 1 capsule (150 mg total) by mouth in the morning and at bedtime. 180 capsule 3   VITAMIN E PO Take by mouth daily.     No current facility-administered medications for this visit.     ALLERGIES: Codeine, Oxycodone, and Vicodin [hydrocodone-acetaminophen]  Family History  Problem Relation Age of Onset   Hypertension Father    Crohn's disease Other    Colon cancer Neg Hx    Colon polyps Neg Hx     Esophageal cancer Neg Hx    Stomach cancer Neg Hx    Rectal cancer Neg Hx    Pancreatic cancer Neg Hx     Social History   Socioeconomic History   Marital status: Divorced    Spouse name: Not on file   Number of children: Not on file   Years of education: Not on file   Highest education level: Not on file  Occupational History   Not on file  Tobacco Use   Smoking status: Former    Types: Cigarettes    Quit date: 04/30/1984    Years since quitting: 37.2   Smokeless tobacco: Never  Vaping Use   Vaping Use: Never used  Substance and Sexual Activity   Alcohol use: Not Currently    Comment: 1 glasses wine per day   Drug use: No   Sexual activity: Yes    Birth control/protection: None  Other Topics Concern   Not on file  Social History Narrative   Not on file   Social Determinants of Health   Financial Resource Strain: Not on file  Food Insecurity: Not on file  Transportation Needs: Not on file  Physical Activity: Not on file  Stress: Not on file  Social Connections: Not on file  Intimate Partner Violence: Not on file    Review of Systems  All other systems reviewed and are negative.  PHYSICAL EXAMINATION:    BP 122/76    Pulse 78    Wt 174 lb (78.9 kg)    SpO2 99%    BMI 28.96 kg/m     General appearance: alert, cooperative and appears stated age   Pelvic: External genitalia:  no lesions              Urethra:  normal appearing urethra with no masses, tenderness or lesions              Bartholins and Skenes: normal                 Vagina: normal appearing vagina with normal color and discharge, no lesions              Cervix: no lesions and IUD strings 3 cm  Bimanual Exam:  Uterus:  normal size, contour, position, consistency, mobility, non-tender              Adnexa: no mass, fullness, tenderness               Chaperone was present for exam.  1. IUD check up Doing well  Routine f/u

## 2021-08-03 ENCOUNTER — Other Ambulatory Visit: Payer: Self-pay | Admitting: Family Medicine

## 2021-08-09 ENCOUNTER — Encounter: Payer: Self-pay | Admitting: Family Medicine

## 2021-08-09 ENCOUNTER — Ambulatory Visit (INDEPENDENT_AMBULATORY_CARE_PROVIDER_SITE_OTHER): Payer: 59 | Admitting: Family Medicine

## 2021-08-09 VITALS — BP 102/78 | HR 69 | Temp 98.5°F | Wt 170.0 lb

## 2021-08-09 DIAGNOSIS — I10 Essential (primary) hypertension: Secondary | ICD-10-CM | POA: Diagnosis not present

## 2021-08-09 DIAGNOSIS — L989 Disorder of the skin and subcutaneous tissue, unspecified: Secondary | ICD-10-CM

## 2021-08-09 DIAGNOSIS — F418 Other specified anxiety disorders: Secondary | ICD-10-CM

## 2021-08-09 MED ORDER — AMLODIPINE BESYLATE 10 MG PO TABS: 10.0000 mg | ORAL_TABLET | Freq: Every day | ORAL | 3 refills | Status: DC

## 2021-08-09 MED ORDER — LISINOPRIL 10 MG PO TABS: 10.0000 mg | ORAL_TABLET | Freq: Every day | ORAL | 3 refills | Status: DC

## 2021-08-09 MED ORDER — VENLAFAXINE HCL ER 150 MG PO CP24: 150.0000 mg | ORAL_CAPSULE | Freq: Two times a day (BID) | ORAL | 3 refills | Status: DC

## 2021-08-09 NOTE — Progress Notes (Signed)
° °  Subjective:    Patient ID: Kim Elliott, female    DOB: 1965/11/30, 56 y.o.   MRN: [id number]  HPI Here to follow up on depression with anxiety and on HTN. She feels well in general. Her work situation has improved so she has less stress on the job. She sleeps well. She wants to stay on Venlafaxine. Her BP has been stable. She also ask me to check a bump on her right forearm that appeared 2 days ago. It is not symptomatic.    Review of Systems  Constitutional: Negative.   Respiratory: Negative.    Cardiovascular: Negative.   Neurological: Negative.   Psychiatric/Behavioral:  Positive for dysphoric mood. Negative for agitation, behavioral problems, confusion, decreased concentration and sleep disturbance. The patient is nervous/anxious.       Objective:   Physical Exam Constitutional:      Appearance: Normal appearance.  Cardiovascular:     Rate and Rhythm: Normal rate and regular rhythm.     Pulses: Normal pulses.     Heart sounds: Normal heart sounds.  Pulmonary:     Effort: Pulmonary effort is normal.     Breath sounds: Normal breath sounds.  Skin:    Comments: There is a 3 mm pink papular lesion on the right forearm   Neurological:     General: No focal deficit present.     Mental Status: She is alert and oriented to person, place, and time.  Psychiatric:        Mood and Affect: Mood normal.        Behavior: Behavior normal.        Thought Content: Thought content normal.          Assessment & Plan:  Her depression with anxiety is doing very well. We refilled the Venlafaxine. Her HTN is stable. The lesion on her arm is concerning for an early basal cell cancer or a melanoma. She will see her dermatologist, Dr. Crista Luria  asap to evaluate this. We spent a total of ( 33  ) minutes reviewing records and discussing these issues.  Alysia Penna, MD

## 2021-09-06 ENCOUNTER — Ambulatory Visit (INDEPENDENT_AMBULATORY_CARE_PROVIDER_SITE_OTHER): Payer: 59 | Admitting: Psychology

## 2021-09-06 DIAGNOSIS — F4323 Adjustment disorder with mixed anxiety and depressed mood: Secondary | ICD-10-CM | POA: Diagnosis not present
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
## 2021-09-13 ENCOUNTER — Ambulatory Visit (INDEPENDENT_AMBULATORY_CARE_PROVIDER_SITE_OTHER): Payer: 59 | Admitting: Psychology

## 2021-09-13 DIAGNOSIS — F4323 Adjustment disorder with mixed anxiety and depressed mood: Secondary | ICD-10-CM
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
## 2021-09-16 ENCOUNTER — Other Ambulatory Visit: Payer: Self-pay | Admitting: Family Medicine

## 2021-09-16 DIAGNOSIS — I10 Essential (primary) hypertension: Secondary | ICD-10-CM

## 2021-09-20 ENCOUNTER — Ambulatory Visit (INDEPENDENT_AMBULATORY_CARE_PROVIDER_SITE_OTHER): Payer: 59 | Admitting: Psychology

## 2021-09-20 DIAGNOSIS — F4323 Adjustment disorder with mixed anxiety and depressed mood: Secondary | ICD-10-CM

## 2021-09-20 NOTE — Progress Notes (Signed)
[place] Counselor/Therapist Progress Note ? ?Patient ID: Kim Elliott, MRN: [id number],   ? ?Date: 09/20/2021 ? ?Time Spent: 45 mins  ? ?Treatment Type: Individual Therapy ? ?Reported Symptoms: Pt presented in the office for this session, granting consent for the session.  "Kim Elliott came to get the rest of his stuff on Saturday and I did not have to talk to him."  Pt shares she is sleeping well and longer but is still tired when she wakes up.  Pt is able to acknowledge that she is having more good days than she used to have.  She is able to call this progress for herself. ? ?Mental Status Exam: ?Appearance:  Casual     ?Behavior: Appropriate  ?Motor: Normal  ?Speech/Language:  Clear and Coherent  ?Affect: Appropriate  ?Mood: normal  ?Thought process: normal  ?Thought content:   WNL  ?Sensory/Perceptual disturbances:   WNL  ?Orientation: oriented to person, place, and time/date  ?Attention: Good  ?Concentration: Good  ?Memory: WNL  ?Fund of knowledge:  Good  ?Insight:   Good  ?Judgment:  Good  ?Impulse Control: Good  ? ?Risk Assessment: ?Danger to Self:  No ?Self-injurious Behavior: No ?Danger to Others: No ?Duty to Warn:no ?Physical Aggression / Violence:No  ?Access to Firearms a concern: No  ?Gang Involvement:No  ? ?Subjective: Pt shares she is planning to move in with [name] for a while; "for no more than a month."  Pt is planning on moving out of the rental house on Sat (putting her stuff in storage) and staying with Kim Elliott.  Encouraged pt to allow herself to appreciate the steps she is taking in a positive direction.  She shares that she continues to perform well at work and lots of people ask her for help there.  Pt is making good progress with packing and getting ready to move on Sat; she has until the end of the month to move out completely.  Kim Elliott is one semester short of graduating from college and pt is frustrated.  Encouraged pt start thinking about what she wants to do for her self  care activities once she gets to Kim Elliott's when she has more time on her hands; she knows she wants to get a weekend job when she knows where she will be settling.  Pt shares that she knows that distance between herself and Kim Elliott is helpful.  Encouraged pt to continue with her self care activities and we will meet in 2 weeks for a follow up session.     ? ?Interventions: Cognitive Behavioral Therapy ? ?Diagnosis:Adjustment disorder with mixed anxiety and depressed mood ? ?Plan: Treatment Plan ?Strengths/Abilities:  Intelligent, Intuitive, Willing to participate in therapy ?Treatment Preferences:  Outpatient Individual Therapy ?Statement of Needs:  Patient is to use CBT, mindfulness and coping skills to help manage and/or decrease symptoms associated with their diagnosis. ?Symptoms:  Depressed/Irritable mood, worry, social withdrawal ?Problems Addressed:  Depressive thoughts, Sadness, Sleep issues, etc. ?Long Term Goals:  Pt to reduce overall level, frequency, and intensity of the feelings of depression/anxiety as evidenced by decreased irritability, negative self talk, and helpless feelings from 6 to 7 days/week to 0 to 1 days/week, per client report, for at least 3 consecutive months.  Progress: 10% ?Short Term Goals:  Pt to verbally express understanding of the relationship between feelings of depression/anxiety and their impact on thinking patterns and behaviors.  Pt to verbalize an understanding of the role that distorted thinking plays in creating fears, excessive worry, and ruminations.  Progress: 10% ?Target Date:  09/07/2022 ?Frequency:  Bi-weekly ?Modality:  Cognitive Behavioral Therapy ?Interventions by Therapist:  Therapist will use CBT, Mindfulness exercises, Coping skills and Referrals, as needed by client. ?Client has verbally approved this treatment plan. ? ?Ivan Anchors, [facility] ?

## 2021-10-06 ENCOUNTER — Ambulatory Visit: Payer: 59 | Admitting: Psychology

## 2021-10-18 ENCOUNTER — Ambulatory Visit (INDEPENDENT_AMBULATORY_CARE_PROVIDER_SITE_OTHER): Payer: 59 | Admitting: Psychology

## 2021-10-18 DIAGNOSIS — F4323 Adjustment disorder with mixed anxiety and depressed mood: Secondary | ICD-10-CM
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
## 2021-10-20 ENCOUNTER — Telehealth: Payer: Self-pay | Admitting: *Deleted

## 2021-10-20 NOTE — Telephone Encounter (Signed)
Patient voicemail if full. ?

## 2021-10-20 NOTE — Telephone Encounter (Signed)
Patient called stating 2 days ago she noticed pelvic pressure, today it has increased. C/o urgency as well, no burning with urination, no fever, patient reports the pressure is not painful, but very uncomfortable. Appointments transferred patient to triage, next available appointment is on Monday. Please advise  ?

## 2021-10-20 NOTE — Telephone Encounter (Signed)
I would recommend that she see if her primary can see her, or go to urgent care.  ?

## 2021-11-01 ENCOUNTER — Ambulatory Visit (INDEPENDENT_AMBULATORY_CARE_PROVIDER_SITE_OTHER): Payer: 59 | Admitting: Psychology

## 2021-11-01 DIAGNOSIS — F4323 Adjustment disorder with mixed anxiety and depressed mood: Secondary | ICD-10-CM
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
## 2021-11-15 ENCOUNTER — Other Ambulatory Visit: Payer: Self-pay | Admitting: Family Medicine

## 2021-11-15 DIAGNOSIS — I10 Essential (primary) hypertension: Secondary | ICD-10-CM

## 2021-11-16 ENCOUNTER — Encounter: Payer: Self-pay | Admitting: Family Medicine

## 2021-11-21 MED ORDER — METOPROLOL SUCCINATE ER 100 MG PO TB24: 100.0000 mg | ORAL_TABLET | Freq: Every day | ORAL | 3 refills | Status: DC

## 2021-11-21 NOTE — Telephone Encounter (Signed)
Done

## 2021-11-25 ENCOUNTER — Encounter: Payer: Self-pay | Admitting: Obstetrics and Gynecology
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
## 2021-12-06 ENCOUNTER — Ambulatory Visit (INDEPENDENT_AMBULATORY_CARE_PROVIDER_SITE_OTHER): Payer: 59 | Admitting: Obstetrics and Gynecology

## 2021-12-06 ENCOUNTER — Encounter: Payer: Self-pay | Admitting: Obstetrics and Gynecology

## 2021-12-06 VITALS — BP 124/82 | HR 104 | Ht 64.0 in | Wt 165.0 lb

## 2021-12-06 DIAGNOSIS — Z113 Encounter for screening for infections with a predominantly sexual mode of transmission: Secondary | ICD-10-CM | POA: Diagnosis not present

## 2021-12-06 DIAGNOSIS — Z01419 Encounter for gynecological examination (general) (routine) without abnormal findings: Secondary | ICD-10-CM | POA: Diagnosis not present

## 2021-12-06 DIAGNOSIS — Z1211 Encounter for screening for malignant neoplasm of colon: Secondary | ICD-10-CM | POA: Diagnosis not present

## 2021-12-06 DIAGNOSIS — Z30431 Encounter for routine checking of intrauterine contraceptive device: Secondary | ICD-10-CM

## 2021-12-06 NOTE — Patient Instructions (Signed)

## 2021-12-07 LAB — SURESWAB CT/NG/T. VAGINALIS
C. trachomatis RNA, TMA: NOT DETECTED
N. gonorrhoeae RNA, TMA: NOT DETECTED
Trichomonas vaginalis RNA: NOT DETECTED

## 2021-12-07 LAB — HEPATITIS C ANTIBODY
Hepatitis C Ab: NONREACTIVE
SIGNAL TO CUT-OFF: 0.09 (ref <1.00)

## 2021-12-07 LAB — HSV(HERPES SIMPLEX VRS) I + II AB-IGG
HAV 1 IGG,TYPE SPECIFIC AB: 14.2 index — ABNORMAL HIGH
HSV 2 IGG,TYPE SPECIFIC AB: <0.9 index

## 2021-12-07 LAB — RPR: RPR Ser Ql: NONREACTIVE

## 2021-12-07 LAB — HIV ANTIBODY (ROUTINE TESTING W REFLEX): HIV 1&2 Ab, 4th Generation: NONREACTIVE

## 2021-12-08 ENCOUNTER — Encounter: Payer: Self-pay | Admitting: Obstetrics and Gynecology

## 2021-12-08 NOTE — Telephone Encounter (Signed)
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 Please check if she has ever had cold sores, if so that explains it. If not, she should let me know if she develops any genital sores. If she has questions, please set up an appointment to further discuss. The rest of her testing is negative. "

## 2021-12-08 NOTE — Telephone Encounter (Signed)
Appointments please scheduled OV with JJ to discuss lab results.

## 2021-12-09 NOTE — Telephone Encounter (Signed)
Patient scheduled on 12/12/21 to discuss HSV results

## 2021-12-12 ENCOUNTER — Encounter: Payer: Self-pay | Admitting: Obstetrics and Gynecology

## 2021-12-12 ENCOUNTER — Ambulatory Visit (INDEPENDENT_AMBULATORY_CARE_PROVIDER_SITE_OTHER): Payer: 59 | Admitting: Obstetrics and Gynecology

## 2021-12-12 VITALS — BP 104/62 | Wt 163.0 lb

## 2021-12-12 DIAGNOSIS — R894 Abnormal immunological findings in specimens from other organs, systems and tissues: Secondary | ICD-10-CM | POA: Diagnosis not present

## 2021-12-12 NOTE — Progress Notes (Signed)
GYNECOLOGY  VISIT   HPI: 56 y.o.   Divorced White or Caucasian Not Hispanic or Latino  female   [phone] with No LMP recorded. (Menstrual status: IUD).   here to discuss positive HSV1 results.   GYNECOLOGIC HISTORY: No LMP recorded. (Menstrual status: IUD). Contraception:IUD  Menopausal hormone therapy: none         OB History     Gravida  5   Para  3   Term      Preterm      AB  2   Living  3      SAB  2   IAB      Ectopic      Multiple      Live Births                 Patient Active Problem List   Diagnosis Date Noted   Personal history of other specified conditions 03/08/2021   Rosacea 03/08/2021   Seborrheic keratosis 03/08/2021   Hx of melanoma in situ 08/26/2020   HTN (hypertension) 08/07/2019   Alcohol dependence ([place]) 04/21/2018   Seasonal allergic rhinitis 04/21/2018   MDD (major depressive disorder), severe ([place]) 04/19/2018   Depression with anxiety 07/20/2017    Past Medical History:  Diagnosis Date   Abnormal Pap smear of cervix    Allergy    Anemia    completed iron   Anxiety    Cancer ([place])    melanoma   Closed fracture of left clavicle 05/05/2014   Depression    Dysmenorrhea    GERD (gastroesophageal reflux disease)    possibly per pt   Hyperlipidemia    Hypertension    Medical history non-contributory    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)    Migraine without aura     Past Surgical History:  Procedure Laterality Date   BREAST SURGERY  1995   lt br bx-neg   COLPOSCOPY     DILATION AND CURETTAGE OF UTERUS     x2post misscarage   MELANOMA EXCISION  2016   left lower leg   ORIF CLAVICULAR FRACTURE Left 05/05/2014   Procedure: OPEN REDUCTION INTERNAL FIXATION (ORIF) LEFT  CLAVICULAR FRACTURE;  Surgeon: Johnny Bridge, MD;  Location: [facility];  Service: Orthopedics;  Laterality: Left;   VEIN LIGATION AND STRIPPING     legs    Current Outpatient Medications  Medication Sig Dispense Refill    amLODipine (NORVASC) 10 MG tablet Take 1 tablet (10 mg total) by mouth daily. 90 tablet 3   Ascorbic Acid (VITAMIN C) 1000 MG tablet Take 1,000 mg by mouth daily.     Biotin w/ Vitamins C & E (HAIR/SKIN/NAILS PO) Take by mouth.     Chlorpheniramine Maleate (ALLERGY PO) Take by mouth.     COLLAGEN PO Take by mouth.     EVENING PRIMROSE OIL PO Take by mouth.     lisinopril (ZESTRIL) 10 MG tablet Take 1 tablet (10 mg total) by mouth daily. 90 tablet 3   metoprolol succinate (TOPROL-XL) 100 MG 24 hr tablet Take 1 tablet (100 mg total) by mouth daily. Take with or immediately following a meal. 90 tablet 3   Omega-3 Fatty Acids (FISH OIL PO) Take by mouth.     venlafaxine XR (EFFEXOR-XR) 150 MG 24 hr capsule Take 1 capsule (150 mg total) by mouth in the morning and at bedtime. 180 capsule 3   VITAMIN E PO Take by mouth daily.  No current facility-administered medications for this visit.     ALLERGIES: Codeine, Oxycodone, and Vicodin [hydrocodone-acetaminophen]  Family History  Problem Relation Age of Onset   Hypertension Father    Crohn's disease Other    Colon cancer Neg Hx    Colon polyps Neg Hx    Esophageal cancer Neg Hx    Stomach cancer Neg Hx    Rectal cancer Neg Hx    Pancreatic cancer Neg Hx     Social History   Socioeconomic History   Marital status: Divorced    Spouse name: Not on file   Number of children: Not on file   Years of education: Not on file   Highest education level: Not on file  Occupational History   Not on file  Tobacco Use   Smoking status: Former    Types: Cigarettes    Quit date: 04/30/1984    Years since quitting: 37.6   Smokeless tobacco: Never  Vaping Use   Vaping Use: Never used  Substance and Sexual Activity   Alcohol use: Not Currently    Comment: 1 glasses wine per day   Drug use: No   Sexual activity: Yes    Partners: Male    Birth control/protection: I.U.D.    Comment: mirena IUD insertion on 06/22/21.  Other Topics Concern    Not on file  Social History Narrative   Not on file   Social Determinants of Health   Financial Resource Strain: Not on file  Food Insecurity: Not on file  Transportation Needs: Not on file  Physical Activity: Not on file  Stress: Not on file  Social Connections: Not on file  Intimate Partner Violence: Not on file    Review of Systems  All other systems reviewed and are negative.   PHYSICAL EXAMINATION:    There were no vitals taken for this visit.    General appearance: alert, cooperative and appears stated age  31. Herpes simplex antibody positive Type 1, no h/o cold sores or genital sores Discussed herpes, answered all of her questions Routine f/u

## 2021-12-13 ENCOUNTER — Ambulatory Visit (INDEPENDENT_AMBULATORY_CARE_PROVIDER_SITE_OTHER): Payer: 59 | Admitting: Psychology

## 2021-12-13 DIAGNOSIS — F4323 Adjustment disorder with mixed anxiety and depressed mood: Secondary | ICD-10-CM
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
## 2021-12-16 ENCOUNTER — Encounter: Payer: Self-pay | Admitting: Family Medicine

## 2021-12-16 ENCOUNTER — Ambulatory Visit (INDEPENDENT_AMBULATORY_CARE_PROVIDER_SITE_OTHER): Payer: 59 | Admitting: Family Medicine

## 2021-12-16 VITALS — BP 102/70 | HR 74 | Temp 98.4°F | Wt 162.0 lb

## 2021-12-16 DIAGNOSIS — F418 Other specified anxiety disorders: Secondary | ICD-10-CM

## 2021-12-16 MED ORDER — BUPROPION HCL ER (XL) 150 MG PO TB24: 150.0000 mg | ORAL_TABLET | Freq: Every day | ORAL | 2 refills | Status: DC

## 2021-12-16 MED ORDER — LORAZEPAM 0.5 MG PO TABS: 0.5000 mg | ORAL_TABLET | Freq: Two times a day (BID) | ORAL | 0 refills | Status: DC | PRN
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
## 2021-12-27 ENCOUNTER — Ambulatory Visit (INDEPENDENT_AMBULATORY_CARE_PROVIDER_SITE_OTHER): Payer: 59 | Admitting: Psychology

## 2021-12-27 DIAGNOSIS — F4323 Adjustment disorder with mixed anxiety and depressed mood: Secondary | ICD-10-CM | POA: Diagnosis not present

## 2022-01-28 ENCOUNTER — Encounter: Payer: Self-pay | Admitting: Family Medicine

## 2022-01-30 MED ORDER — LORAZEPAM 0.5 MG PO TABS: 0.5000 mg | ORAL_TABLET | Freq: Two times a day (BID) | ORAL | 5 refills | Status: DC | PRN

## 2022-01-30 NOTE — Telephone Encounter (Signed)
Done

## 2022-03-02 ENCOUNTER — Ambulatory Visit: Payer: 59 | Admitting: Psychology

## 2022-03-06 ENCOUNTER — Other Ambulatory Visit: Payer: Self-pay | Admitting: Family Medicine

## 2022-03-07 NOTE — Telephone Encounter (Signed)
Pt LOV was on 12/16/2021 Last refill done on 12/16/21 Please Advise

## 2022-04-24 ENCOUNTER — Telehealth: Payer: Self-pay

## 2022-04-24 NOTE — Telephone Encounter (Signed)
-  Caller states she has asthma. asthma has been flaring up at night. no fever. No rescue inhaler currently. no SOB now. she is coughing at night. Coughing so hard she vomits  04/24/2022 10:13:29 AM See PCP within 24 Hours Stringer, RN, Vanita Ingles  Comments User: Dannielle Burn, RN Date/Time Eilene Ghazi Time): 04/24/2022 10:13:00 AM triage outcome upgraded to see physician within 24 hrs as pt is coughing so hard at night, she is vomiting  User: Dannielle Burn, RN Date/Time Eilene Ghazi Time): 04/24/2022 10:14:38 AM warm transferred to the office for appt  Referrals REFERRED TO PCP OFFICE  Pt has an appt to see PCP on 04/25/22

## 2022-04-25 ENCOUNTER — Ambulatory Visit (INDEPENDENT_AMBULATORY_CARE_PROVIDER_SITE_OTHER): Payer: Commercial Managed Care - HMO | Admitting: Family Medicine

## 2022-04-25 ENCOUNTER — Encounter: Payer: Self-pay | Admitting: Family Medicine

## 2022-04-25 VITALS — BP 140/100 | HR 101 | Temp 97.9°F | Wt 156.0 lb

## 2022-04-25 DIAGNOSIS — F4310 Post-traumatic stress disorder, unspecified: Secondary | ICD-10-CM | POA: Diagnosis not present

## 2022-04-25 DIAGNOSIS — F418 Other specified anxiety disorders: Secondary | ICD-10-CM | POA: Diagnosis not present

## 2022-04-25 DIAGNOSIS — J45901 Unspecified asthma with (acute) exacerbation: Secondary | ICD-10-CM

## 2022-04-25 DIAGNOSIS — J45909 Unspecified asthma, uncomplicated: Secondary | ICD-10-CM | POA: Insufficient documentation

## 2022-04-25 MED ORDER — LORAZEPAM 0.5 MG PO TABS: 0.5000 mg | ORAL_TABLET | Freq: Four times a day (QID) | ORAL | 5 refills | Status: DC | PRN

## 2022-04-25 MED ORDER — ALBUTEROL SULFATE HFA 108 (90 BASE) MCG/ACT IN AERS: 2.0000 | INHALATION_SPRAY | RESPIRATORY_TRACT | 5 refills | Status: DC | PRN

## 2022-04-25 MED ORDER — PRAZOSIN HCL 2 MG PO CAPS: 2.0000 mg | ORAL_CAPSULE | Freq: Every day | ORAL | 2 refills | Status: DC

## 2022-04-25 MED ORDER — BUPROPION HCL ER (XL) 300 MG PO TB24: 300.0000 mg | ORAL_TABLET | Freq: Every day | ORAL | 5 refills | Status: DC

## 2022-04-25 MED ORDER — VENLAFAXINE HCL ER 150 MG PO CP24: 300.0000 mg | ORAL_CAPSULE | Freq: Every day | ORAL | 3 refills | Status: DC
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
## 2022-04-26 ENCOUNTER — Ambulatory Visit (INDEPENDENT_AMBULATORY_CARE_PROVIDER_SITE_OTHER): Payer: Commercial Managed Care - HMO | Admitting: Psychology

## 2022-04-26 DIAGNOSIS — F4323 Adjustment disorder with mixed anxiety and depressed mood: Secondary | ICD-10-CM
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
## 2022-04-27 ENCOUNTER — Ambulatory Visit: Payer: Commercial Managed Care - HMO | Admitting: Psychology

## 2022-05-02 ENCOUNTER — Ambulatory Visit: Payer: Commercial Managed Care - HMO | Admitting: Psychology

## 2022-06-21 ENCOUNTER — Ambulatory Visit (INDEPENDENT_AMBULATORY_CARE_PROVIDER_SITE_OTHER): Payer: Self-pay | Admitting: Family Medicine

## 2022-06-21 ENCOUNTER — Encounter: Payer: Self-pay | Admitting: Family Medicine

## 2022-06-21 VITALS — BP 156/118 | HR 90 | Temp 98.7°F | Wt 156.2 lb

## 2022-06-21 DIAGNOSIS — J029 Acute pharyngitis, unspecified: Secondary | ICD-10-CM

## 2022-06-21 DIAGNOSIS — I10 Essential (primary) hypertension: Secondary | ICD-10-CM

## 2022-06-21 DIAGNOSIS — H1033 Unspecified acute conjunctivitis, bilateral: Secondary | ICD-10-CM

## 2022-06-21 DIAGNOSIS — J069 Acute upper respiratory infection, unspecified: Secondary | ICD-10-CM

## 2022-06-21 DIAGNOSIS — H6992 Unspecified Eustachian tube disorder, left ear: Secondary | ICD-10-CM

## 2022-06-21 DIAGNOSIS — H6121 Impacted cerumen, right ear: Secondary | ICD-10-CM

## 2022-06-21 LAB — POCT INFLUENZA A/B
Influenza A, POC: NEGATIVE
Influenza B, POC: NEGATIVE

## 2022-06-21 LAB — POC COVID19 BINAXNOW: SARS Coronavirus 2 Ag: NEGATIVE

## 2022-06-21 LAB — POCT RAPID STREP A (OFFICE): Rapid Strep A Screen: NEGATIVE

## 2022-06-21 MED ORDER — POLYMYXIN B-TRIMETHOPRIM 10000-0.1 UNIT/ML-% OP SOLN: 1.0000 [drp] | OPHTHALMIC | 0 refills | Status: AC

## 2022-06-21 NOTE — Patient Instructions (Signed)
Do not forget to monitor your blood pressure at home.  For continued elevation consistently greater than 140/90 schedule follow-up with PCP to restart medication.

## 2022-06-21 NOTE — Progress Notes (Signed)
Acute Office Visit  Subjective:     Patient ID: Kim [employer], female    DOB: [birth year], 56 y.o.   MRN: [id number]  Chief Complaint  Patient presents with   Nasal Congestion    Pt reports sx of sore throat, eye pain started on Sunday night. Nasal congetion, chills, bodyache, fever, fatigue, sore throat started yesterday. Taking synex, something for stye.     HPI Patient is in today for acute illness.  Patient endorses waking up on Monday morning with right eye swelling crusted shut, painful.  Patient from work and flushed the eye out.  Used OTC medication for styes.  Then developed burning, itching, drainage in bilateral eyes.  Also with nasal congestion, sinus pressure, fever, chills, sore throat, nausea, left ear pain.  Starting earlier this week.  Had 4 loose stools this morning.  Used hot compresses, Synex for symptoms.  ROS  + Bilateral eye drainage, crusting, erythema pain and nasal congestion, sinus pressure, fever, chills, sore throat, loose stools.    Objective:    BP (!) 156/118 (BP Location: Left Arm, Patient Position: Sitting, Cuff Size: Normal)   Pulse 90   Temp 98.7 F (37.1 C) (Oral)   Wt 156 lb 3.2 oz (70.9 kg)   SpO2 98%   BMI 26.81 kg/m    Physical Exam Gen. Pleasant, well developed, well-nourished, in NAD HEENT - [place]/AT, PERRL, EOMI, conjunctive clear, no scleral icterus, no stye or drainage.  no nasal drainage, pharynx with postnasal drainage and erythema, no exudate.  Left TM full.  Right canal occluded with cerumen.  Right TM partially visualized after irrigation. Lungs: no use of accessory muscles, CTAB, no wheezes, rales or rhonchi Cardiovascular: RRR, No r/g/m, no peripheral edema Musculoskeletal: No deformities, moves all four extremities, no cyanosis or clubbing, normal tone Neuro:  A&Ox3, CN II-XII intact, normal gait Skin:  Warm, dry, intact, no lesions  Results for orders placed or performed in visit on 06/21/22  POC Rapid Strep A  Result  Value Ref Range   Rapid Strep A Screen Negative Negative        Assessment & Plan:   Problem List Items Addressed This Visit   None Visit Diagnoses     Viral URI with cough    -  Primary   Acute conjunctivitis of both eyes, unspecified acute conjunctivitis type       Relevant Medications   trimethoprim-polymyxin b (POLYTRIM) ophthalmic solution   Eustachian tube dysfunction, left       Sore throat       Relevant Orders   POC COVID-19 (Completed)   POC Influenza A/B (Completed)   POC Rapid Strep A (Completed)   Impacted cerumen of right ear       Essential hypertension          COVID, flu, strep testing negative.  Continue supportive care/expectant management for viral URI.  Polytrim eyedrops for conjunctivitis.  Antihistamine for eustachian tube dysfunction/left ear pressure.  Given note for work.  Cerumen noted in right canal.  Consent obtained.  Right canal irrigated.  Patient tolerated procedure well.  Discussed avoiding cough/cold medications with decongestant as may be contributing to elevated blood pressure.  Per chart review BP elevated at last office visit.  Likely needs to restart blood pressure medications.  Will have patient monitor BP at home keeping a log of BP readings.  Follow-up in 2 weeks with PCP for BP, sooner if needed for continued or worsening symptoms.  Billie Ruddy,  MD   

## 2022-08-11 ENCOUNTER — Encounter: Payer: Self-pay | Admitting: Family Medicine

## 2022-08-11 ENCOUNTER — Other Ambulatory Visit: Payer: Self-pay | Admitting: Family Medicine

## 2022-08-11 ENCOUNTER — Other Ambulatory Visit: Payer: Self-pay

## 2022-08-11 MED ORDER — VENLAFAXINE HCL ER 150 MG PO CP24: 300.0000 mg | ORAL_CAPSULE | Freq: Every day | ORAL | 1 refills | Status: DC

## 2022-08-15 MED ORDER — LORAZEPAM 0.5 MG PO TABS: 0.5000 mg | ORAL_TABLET | Freq: Four times a day (QID) | ORAL | 5 refills | Status: DC | PRN

## 2022-08-15 MED ORDER — VENLAFAXINE HCL ER 150 MG PO CP24: 300.0000 mg | ORAL_CAPSULE | Freq: Every day | ORAL | 3 refills | Status: DC

## 2022-08-15 NOTE — Telephone Encounter (Signed)
Done

## 2022-09-29 IMAGING — US US ABDOMEN COMPLETE
1 series · 14 of 25 positions shown · non-contrast
Comparison: None.

CLINICAL DATA: Epigastric pain

EXAM:
ABDOMEN ULTRASOUND COMPLETE

[Series 1: us abdomen complete · 0.20mm/px · 14 of 92 slices shown]
[im 1/92]
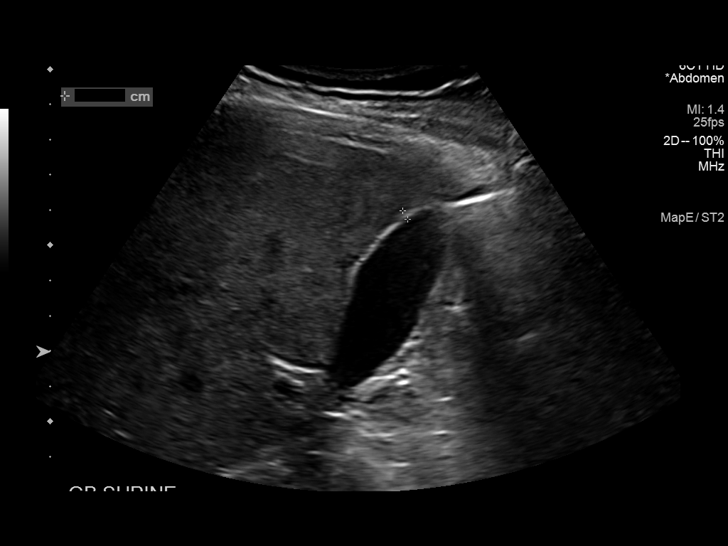
[im 8/92]
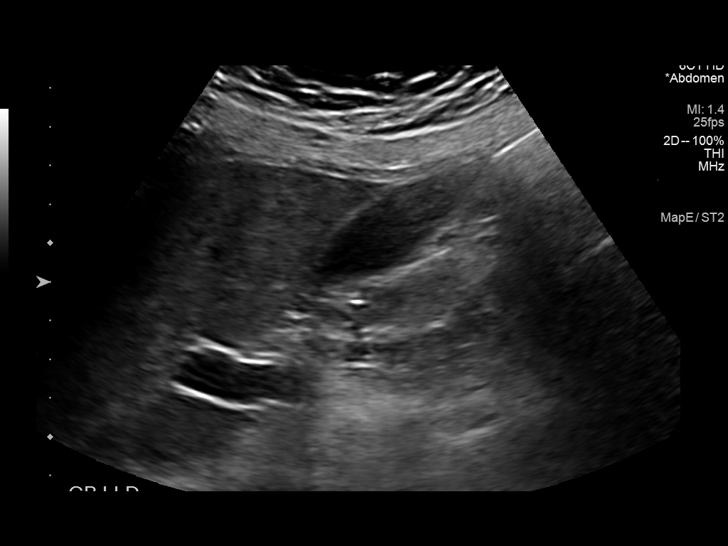
[im 16/92]
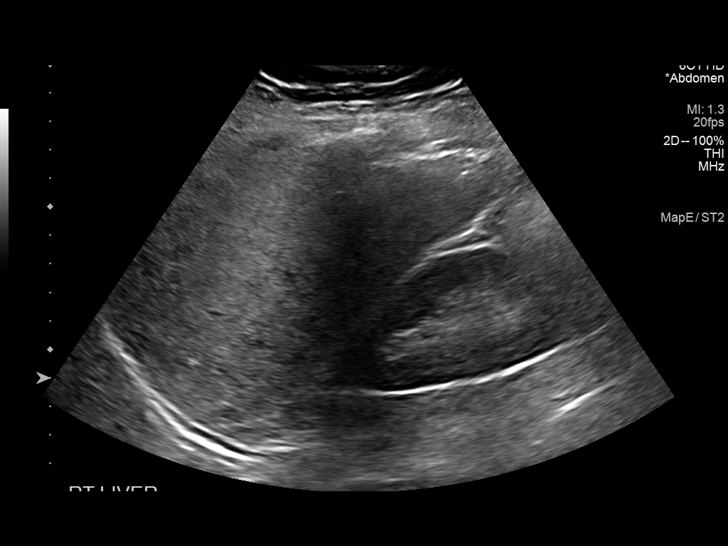
[im 23/92]
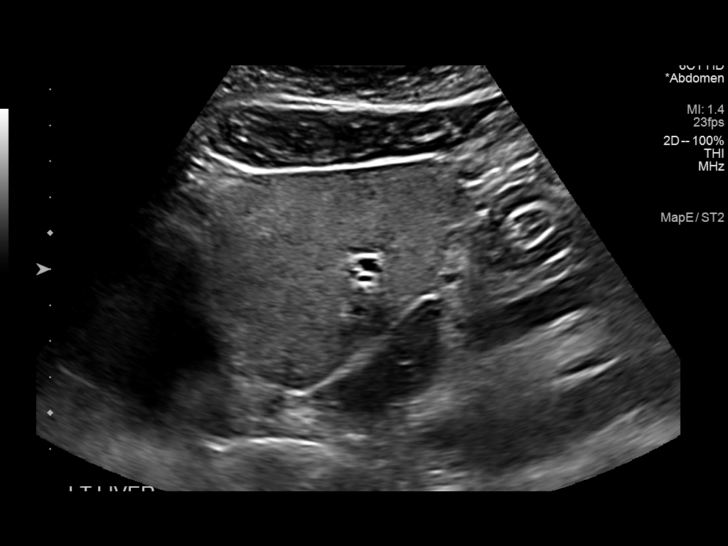
[im 31/92]
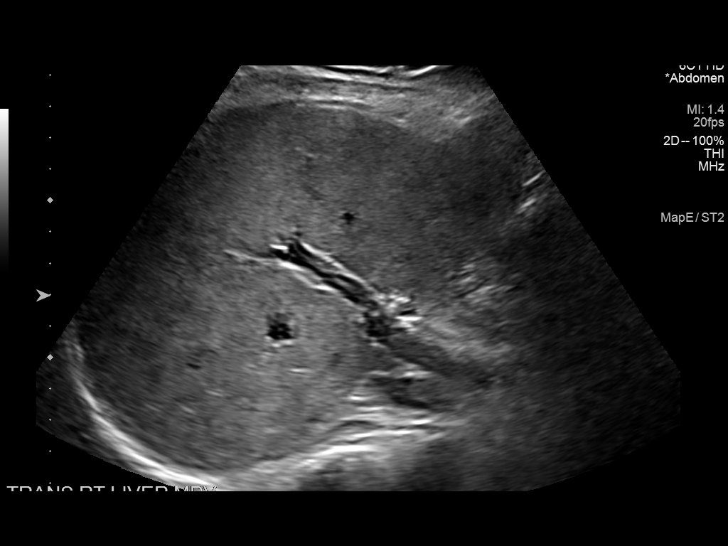
[im 35/92]
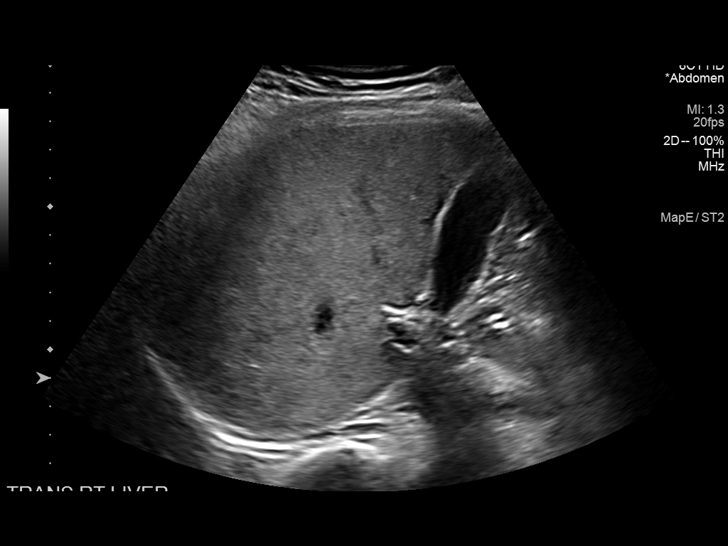
[im 42/92]
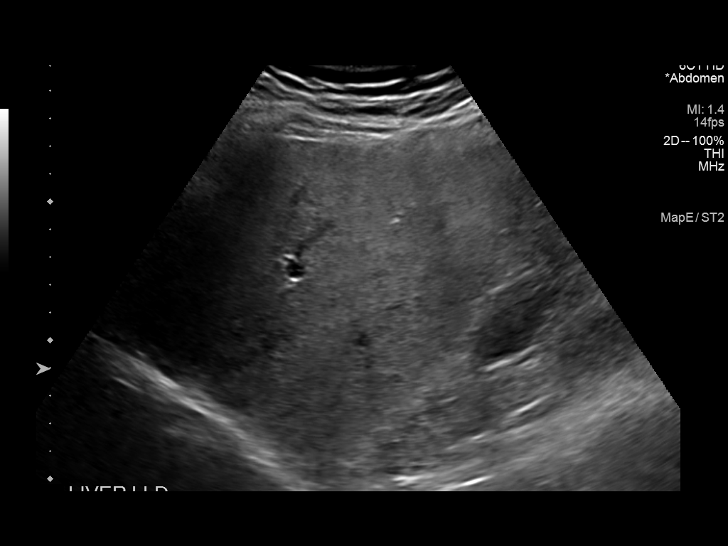
[im 50/92]
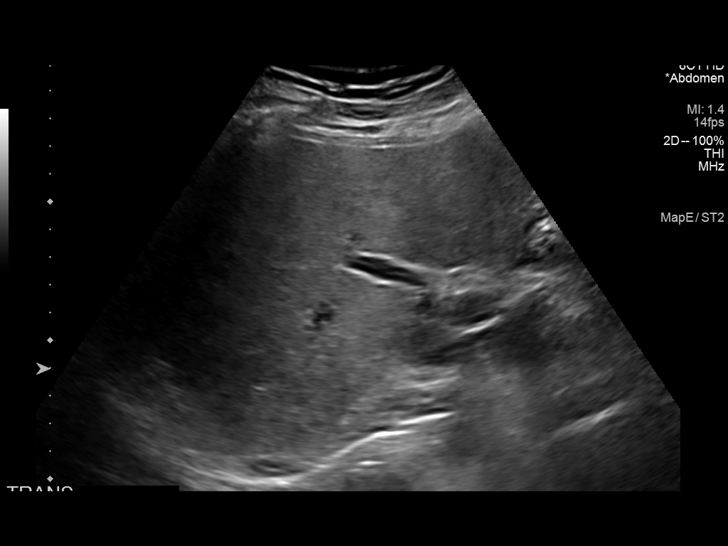
[im 57/92]
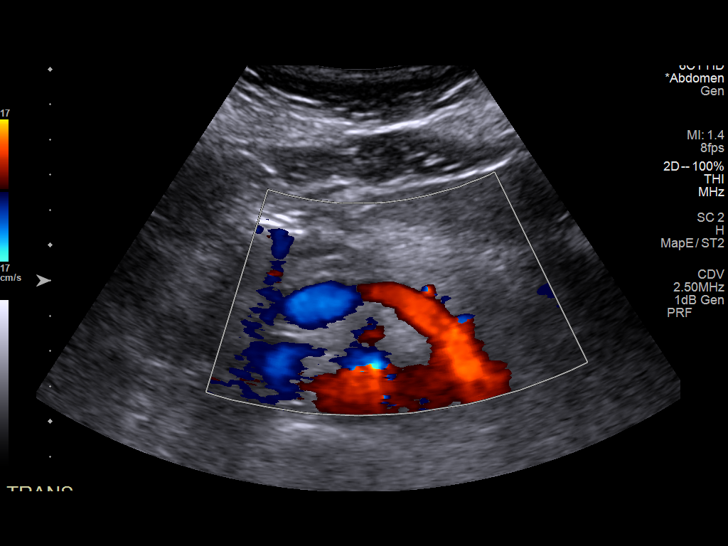
[im 61/92]
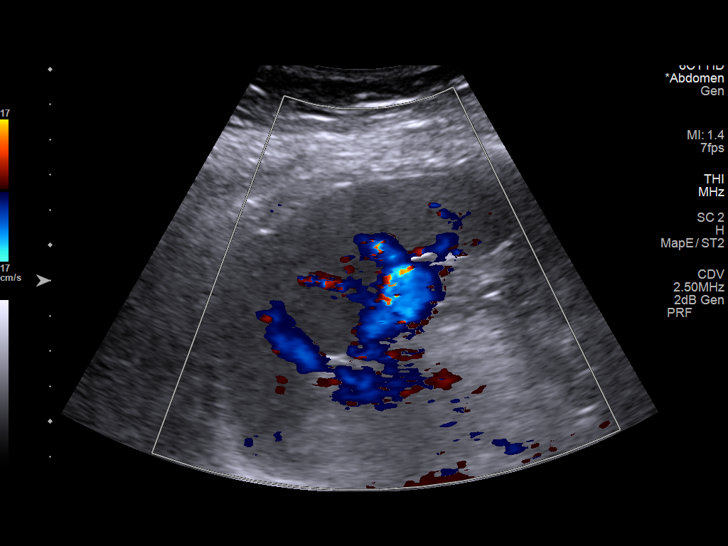
[im 69/92]
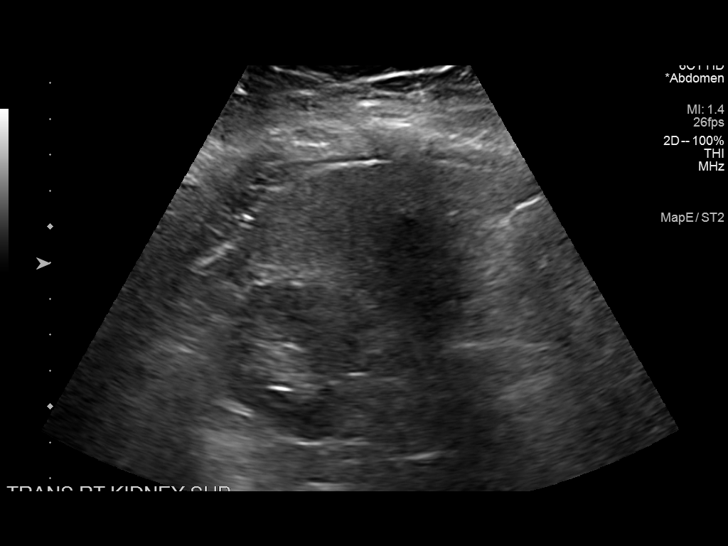
[im 76/92]
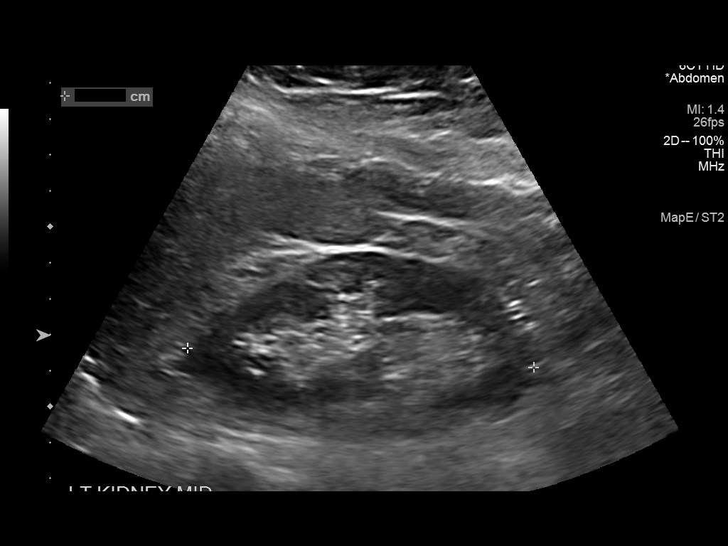
[im 84/92]
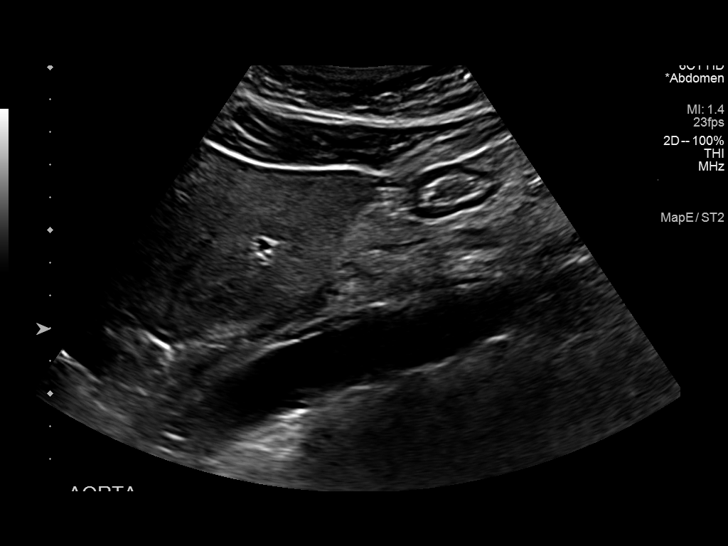
[im 92/92]
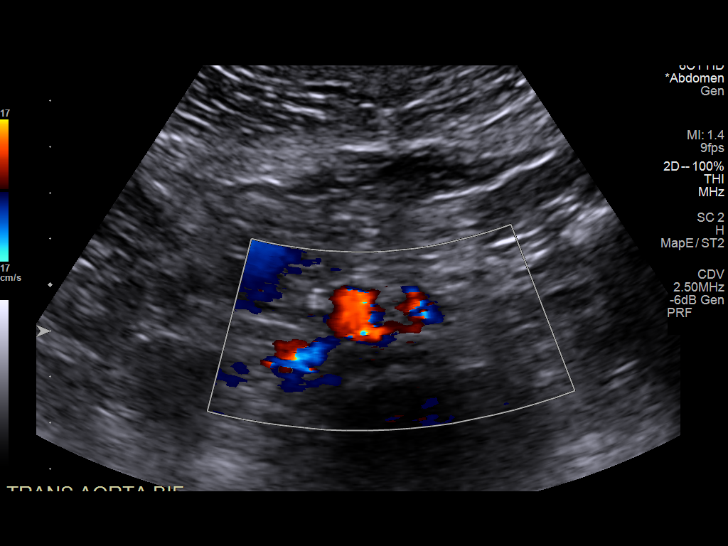

[14 of 25 positions shown; findings below may reference images not displayed]

FINDINGS: Gallbladder: No gallstones or wall thickening visualized. No
sonographic Murphy sign noted by sonographer.

Common bile duct: Diameter: 2.4 mm.

Liver: Diffusely increased in echogenicity likely related to fatty
infiltration. No focal mass is noted. Portal vein is patent on color
Doppler imaging with normal direction of blood flow towards the
liver.

IVC: No abnormality visualized.

Pancreas: Visualized portion unremarkable.

Spleen: Size and appearance within normal limits.

Right Kidney: Length: 10.0 cm. Echogenicity within normal limits.
No mass or hydronephrosis visualized.

Left Kidney: Length: 9.6 cm. Echogenicity within normal limits. No
mass or hydronephrosis visualized.

Abdominal aorta: No aneurysm visualized.

Other findings: None.
IMPRESSION: Changes consistent with fatty infiltration of the liver. No acute
abnormality noted.

## 2022-11-15 ENCOUNTER — Encounter: Payer: Self-pay | Admitting: Family Medicine

## 2022-11-15 ENCOUNTER — Ambulatory Visit (INDEPENDENT_AMBULATORY_CARE_PROVIDER_SITE_OTHER): Payer: Self-pay | Admitting: Family Medicine

## 2022-11-15 VITALS — BP 152/98 | HR 73 | Temp 98.2°F | Wt 153.4 lb

## 2022-11-15 DIAGNOSIS — F418 Other specified anxiety disorders: Secondary | ICD-10-CM

## 2022-11-15 DIAGNOSIS — I10 Essential (primary) hypertension: Secondary | ICD-10-CM

## 2022-11-15 MED ORDER — LORAZEPAM 0.5 MG PO TABS: 0.5000 mg | ORAL_TABLET | Freq: Four times a day (QID) | ORAL | 5 refills | Status: DC | PRN

## 2022-11-15 MED ORDER — VENLAFAXINE HCL ER 150 MG PO CP24: 300.0000 mg | ORAL_CAPSULE | Freq: Every day | ORAL | 5 refills | Status: DC

## 2022-11-15 MED ORDER — METOPROLOL SUCCINATE ER 100 MG PO TB24: 100.0000 mg | ORAL_TABLET | Freq: Every day | ORAL | 5 refills | Status: DC

## 2022-11-15 NOTE — Progress Notes (Signed)
   Subjective:    Patient ID: [employer], female    DOB: [birth year], 57 y.o.   MRN: [id number]  HPI Here to follow up on HTN and depression with anxiety. When we last saw her in October she was taking Wellbutrin XL and Effexor XR, along with Lorazepam as needed. Her depression and anxiety were doing fairly well a that time. She was also taking Lisinopril, Metoprolol succinate, and Prazocin, and her BP was well controlled. 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 Therefore she has stopped taking all the medications except for Lorazepam. Now her BP is high and her depression and anxiety are not well controlled. She continues to be under a lot of stress with work problems and family issues. Her appetite is poor and she does not sleep well.    Review of Systems  Constitutional: Negative.   Respiratory: Negative.    Cardiovascular: Negative.   Psychiatric/Behavioral:  Positive for decreased concentration, dysphoric mood and sleep disturbance. Negative for agitation, behavioral problems, confusion, hallucinations, self-injury and suicidal ideas. The patient is nervous/anxious.        Objective:   Physical Exam Constitutional:      Appearance: Normal appearance.  Cardiovascular:     Rate and Rhythm: Normal rate and regular rhythm.     Pulses: Normal pulses.     Heart sounds: Normal heart sounds.  Pulmonary:     Effort: Pulmonary effort is normal.     Breath sounds: Normal breath sounds.  Musculoskeletal:     Right lower leg: No edema.     Left lower leg: No edema.  Neurological:     General: No focal deficit present.     Mental Status: She is alert and oriented to person, place, and time.     Motor: No weakness.     Coordination: Coordination normal.     Gait: Gait normal.  Psychiatric:        Behavior: Behavior normal.        Thought Content: Thought content normal.     Comments: Her  affect is depressed and almost tearful            Assessment & Plan:  Her HTN is not well controlled, so she will start back on Metoprolol succinate 100 mg daily. Her depression and anxiety are also out of control, so she will start back on Effexor XR 150 mg taking 2 tablets (300 mg) daily. She thinks by using [facility] at the pharmacy she can afford to purchase these 3 generic meds. She will follow up in 4 weeks.  Gershon Crane, MD

## 2022-11-19 IMAGING — CT CT ABD-PELV W/ CM
1 of 3 series · 13 of 32 positions shown, 19 images · IV contrast (iopamidol)
Comparison: Ultrasound 03/10/2021.

CLINICAL DATA: Umbilical hernia, for abdominal pain, intermittent

EXAM:
CT ABDOMEN AND PELVIS WITH CONTRAST
TECHNIQUE: Multidetector CT imaging of the abdomen and pelvis was performed
using the standard protocol following bolus administration of
intravenous contrast.
Creatinine was obtained on site at [HOSPITAL] at [HOSPITAL].
Results: Creatinine 0.8 mg/dL.
CONTRAST:  100mL [id number] IOPAMIDOL ([id number]) INJECTION 61%

[Series 2: abd/pelvis w/cm · axial · 0.78mm/px · z∈[-460,-60]mm · 13 of 94 slices shown, 19 images]
[im 7/94  soft-tissue]
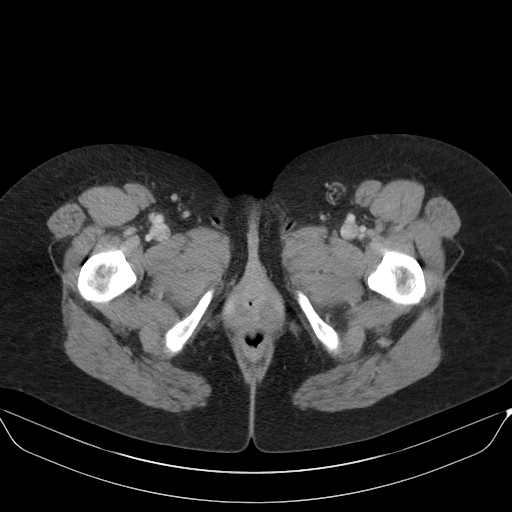
[im 7/94  bone]
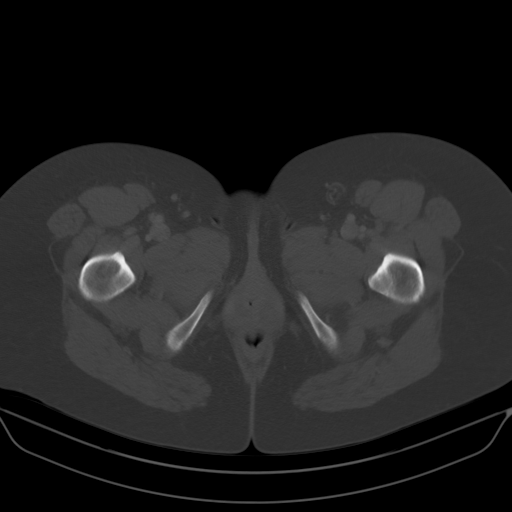
[im 14/94  soft-tissue]
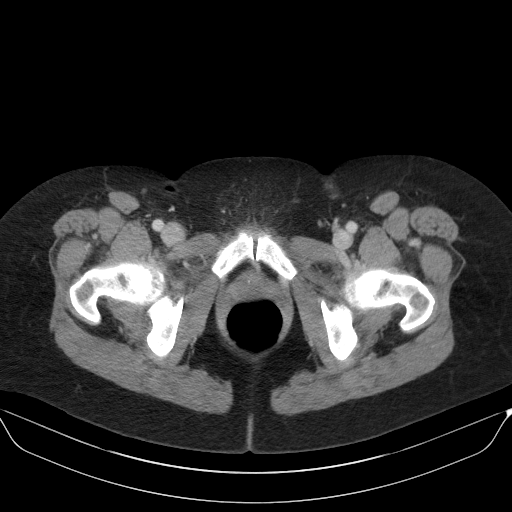
[im 20/94  soft-tissue]
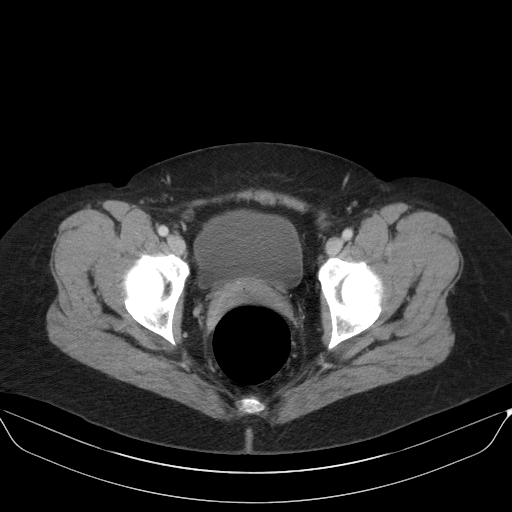
[im 27/94  soft-tissue]
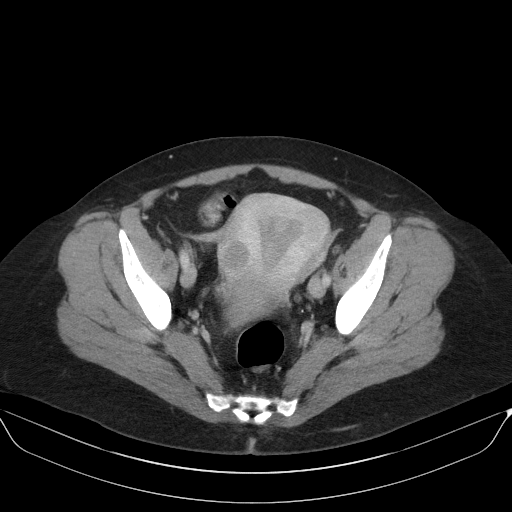
[im 34/94  soft-tissue]
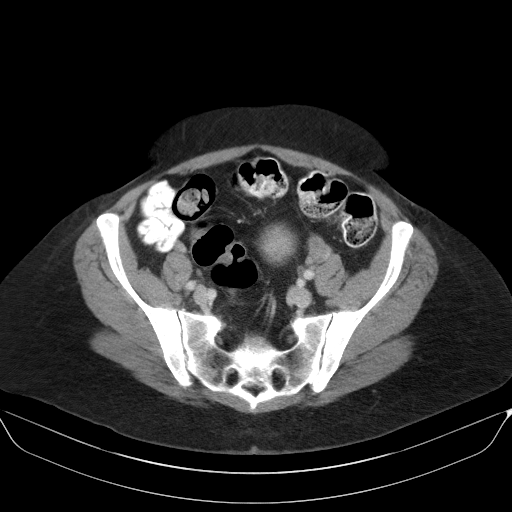
[im 40/94  soft-tissue]
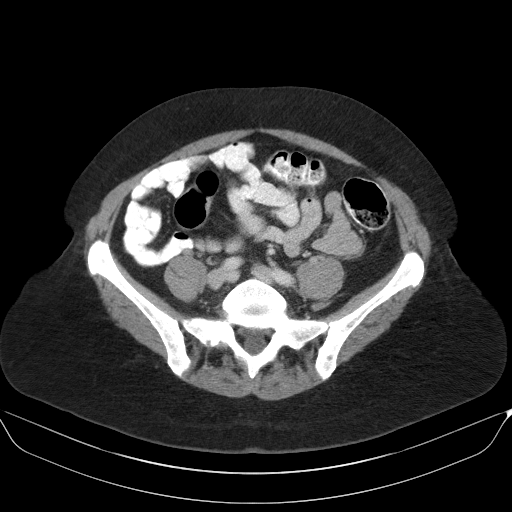
[im 47/94  soft-tissue]
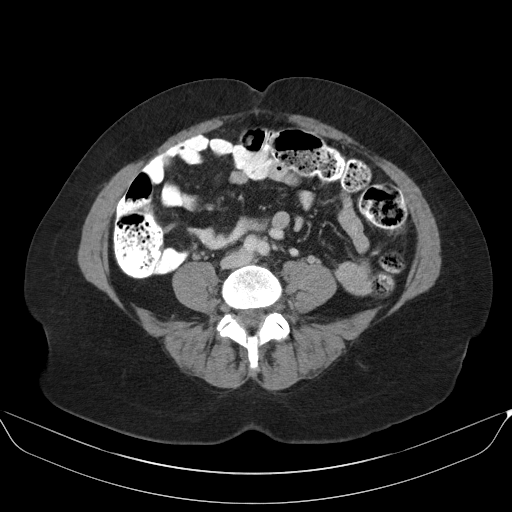
[im 54/94  soft-tissue]
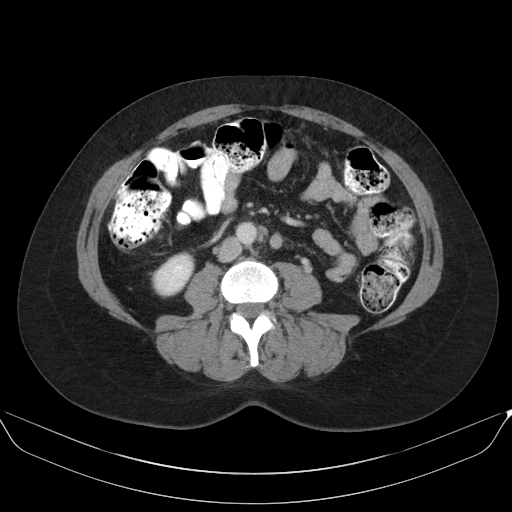
[im 60/94  soft-tissue]
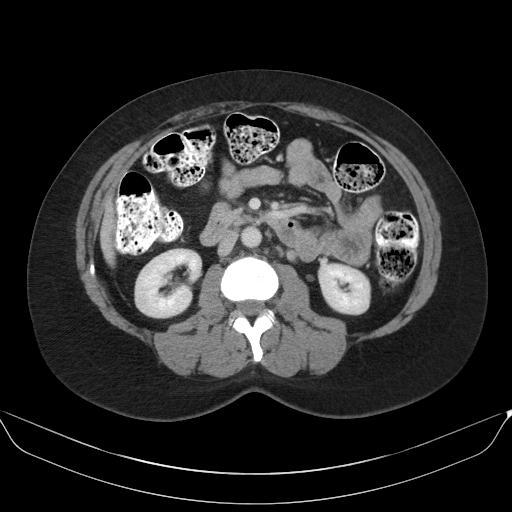
[im 60/94  bone]
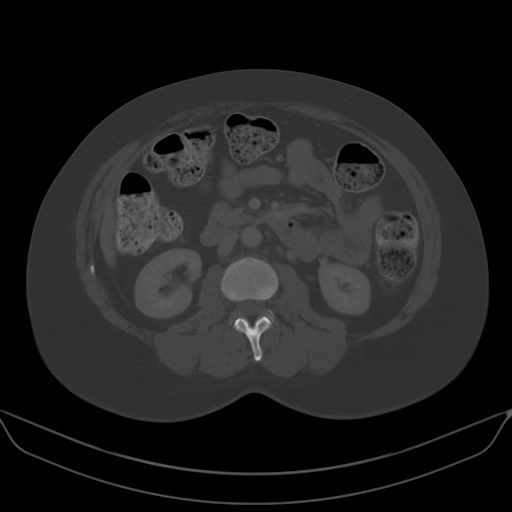
[im 67/94  soft-tissue]
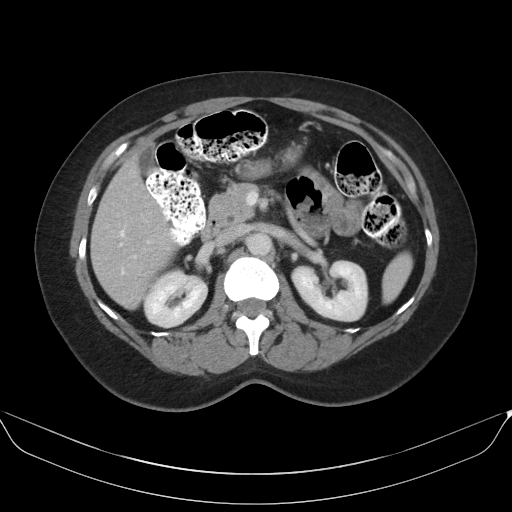
[im 67/94  lung]
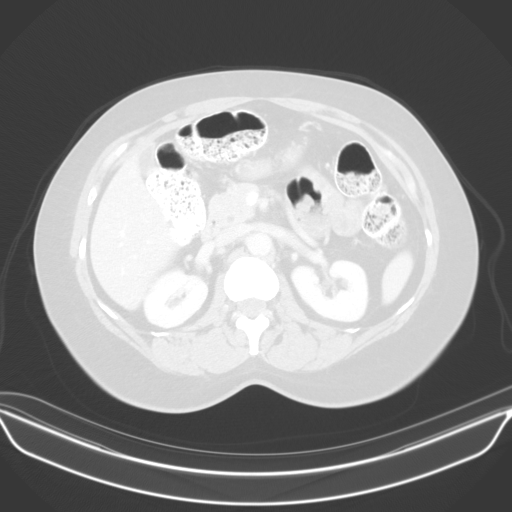
[im 74/94  soft-tissue]
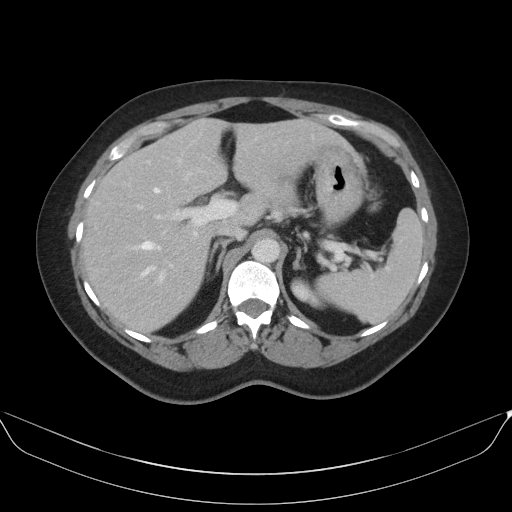
[im 74/94  lung]
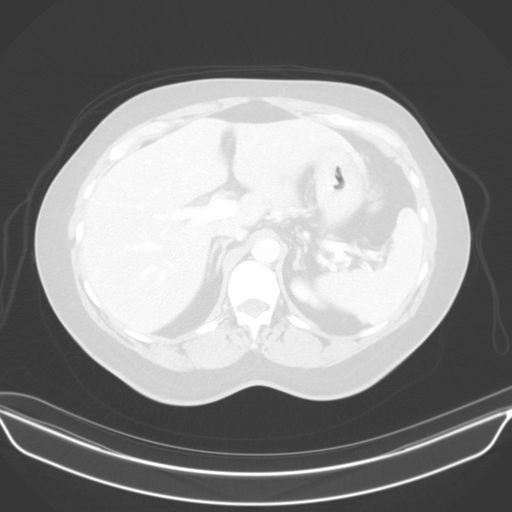
[im 80/94  soft-tissue]
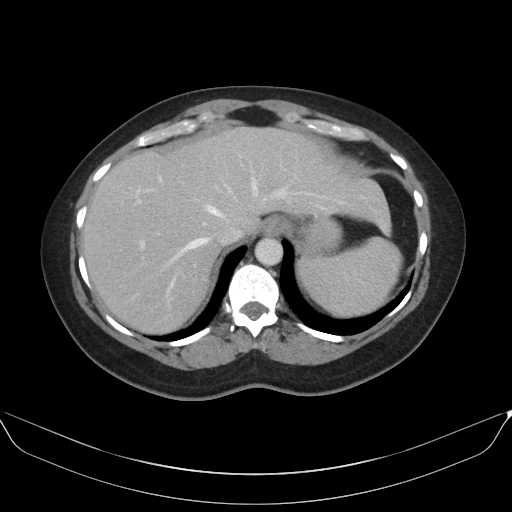
[im 80/94  lung]
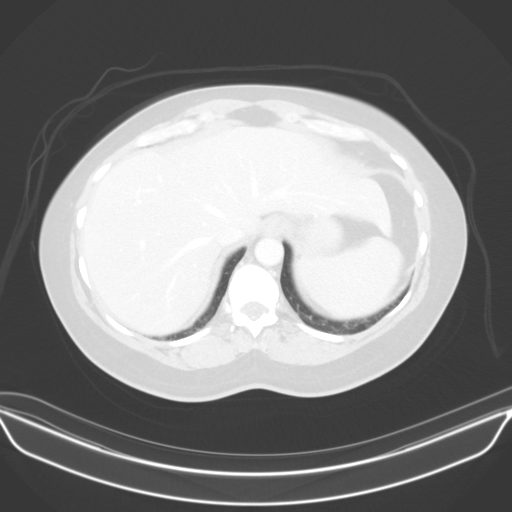
[im 87/94  soft-tissue]
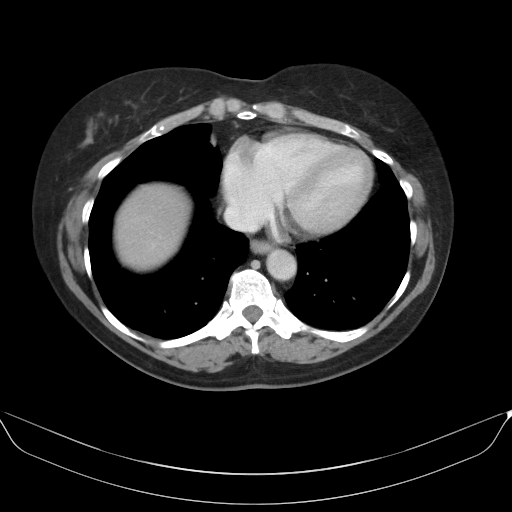
[im 87/94  lung]
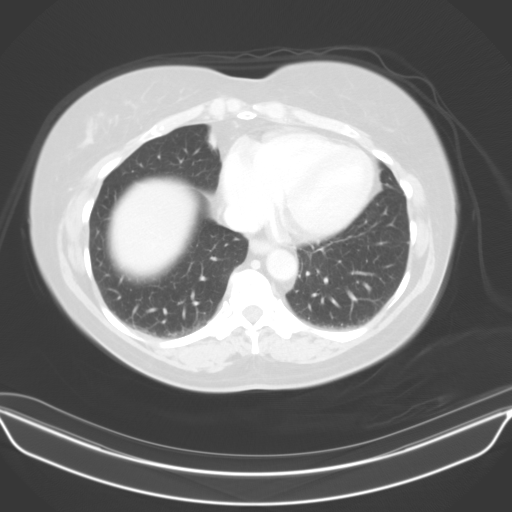

[13 of 32 positions shown; findings below may reference images not displayed]

FINDINGS: Lower chest: There is a 1.9 x 1.1 cm mass in the posterior depth
right breast (series 2, image 2). No acute abnormality.

Hepatobiliary: No focal liver abnormality is seen. No gallstones,
gallbladder wall thickening, or biliary dilatation.

Pancreas: Unremarkable. No pancreatic ductal dilatation or
surrounding inflammatory changes.

Spleen: Normal in size without focal abnormality.

Adrenals/Urinary Tract: Adrenal glands are unremarkable. Kidneys are
normal, without renal calculi, focal lesion, or hydronephrosis.
Bladder is unremarkable.

Stomach/Bowel: Stomach is within normal limits. There is no evidence
of bowel obstruction. The appendix is normal.

Vascular/Lymphatic: Scattered aortoiliac atherosclerotic
calcifications. No AAA. No lymphadenopathy.

Reproductive: Fluid and hyperdense debris within the endometrial
canal. The uterine wall is heterogeneous with multiple low-density
masses, possibly fibroids.

Other: Tiny fat containing umbilical hernia. No bowel containing
hernia.

Musculoskeletal: No acute or significant osseous findings.
IMPRESSION: No acute findings in the abdomen or pelvis. Tiny fat containing
umbilical hernia.

Heterogeneous uterus with multiple low-density masses and
fluid-filled endometrial cavity with internal hyperdense material.
Correlate with abnormal uterine bleeding, and recommend follow-up
with OBGYN with consideration of endometrial biopsy, especially if
the patient is postmenopausal.

1.9 x 1.1 cm mass in the posterior depth right breast, recommend
correlation with mammography.

## 2022-12-06 IMAGING — MG DIGITAL DIAGNOSTIC BILAT W/ TOMO W/ CAD
8 series · 8 of 24 positions shown · non-contrast
Comparison: Abdomen and pelvis CT dated 04/08/2021.

CLINICAL DATA: 1.9 cm oval, circumscribed mass seen in the
posteromedial right breast on a recent abdomen and pelvis CT.

EXAM:
DIGITAL DIAGNOSTIC BILATERAL MAMMOGRAM WITH TOMOSYNTHESIS AND CAD;
ULTRASOUND RIGHT BREAST LIMITED
TECHNIQUE: Bilateral digital diagnostic mammography and breast tomosynthesis
was performed. The images were evaluated with computer-aided
detection.; Targeted ultrasound examination of the right breast was
performed

[L CC synth-2D]
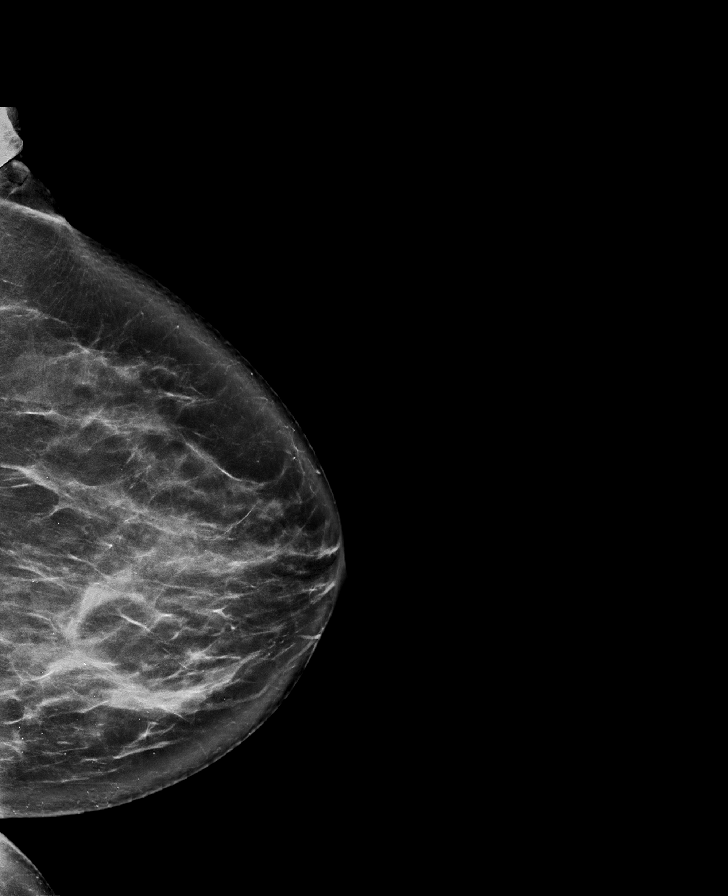

[R MLO synth-2D]
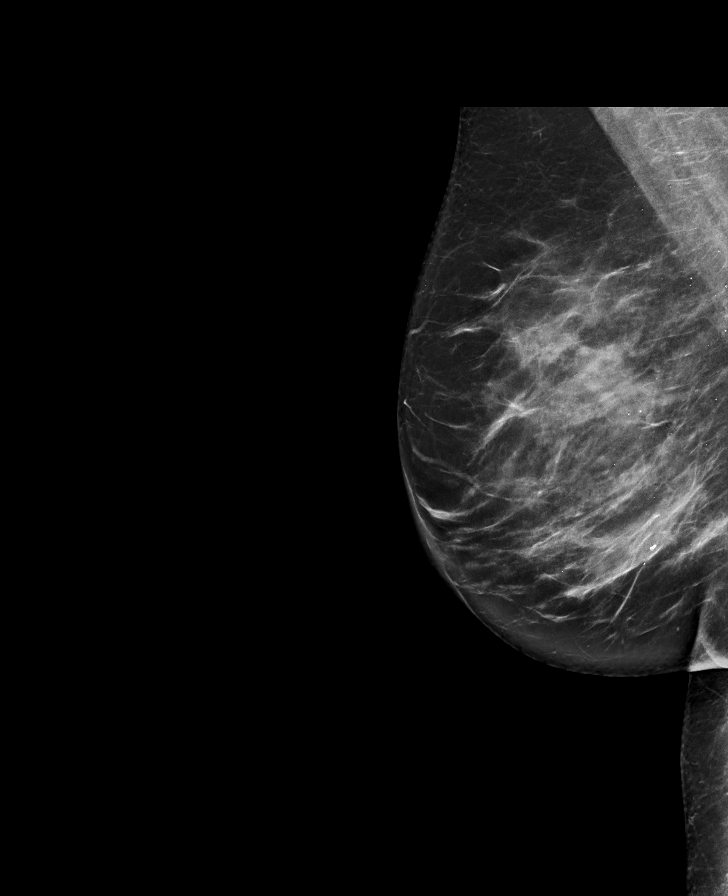

[R CC synth-2D]
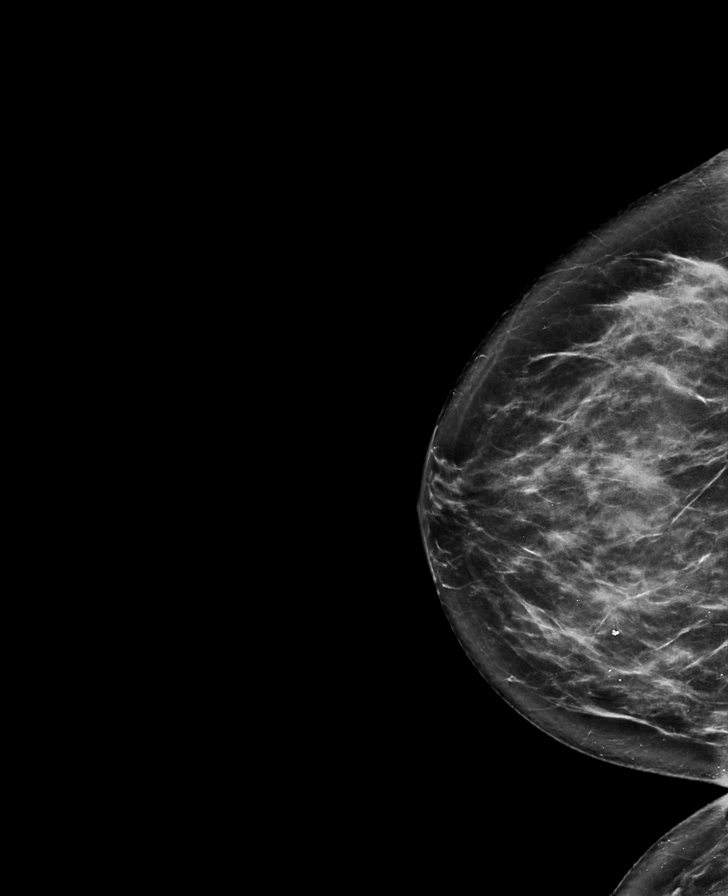

[L MLO synth-2D]
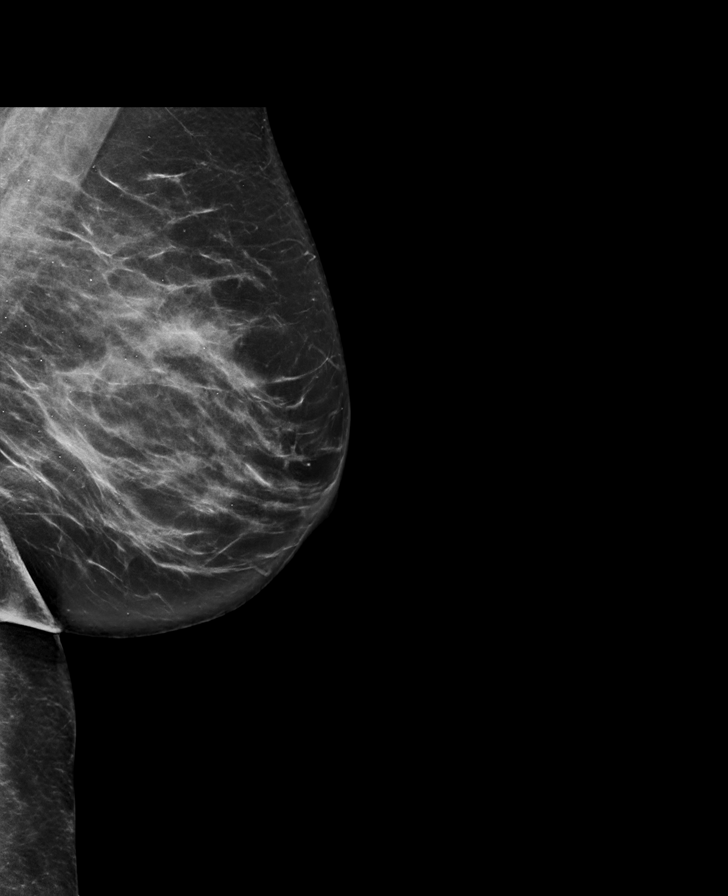

[R MLO tomo · tomo slice 45/88.0]
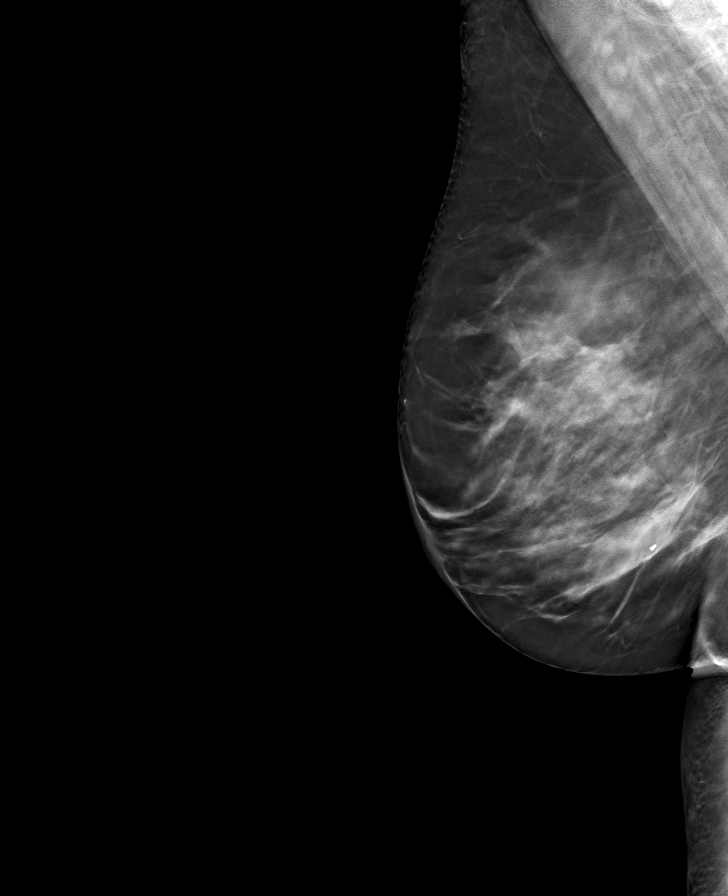

[L MLO tomo · tomo slice 42/83.0]
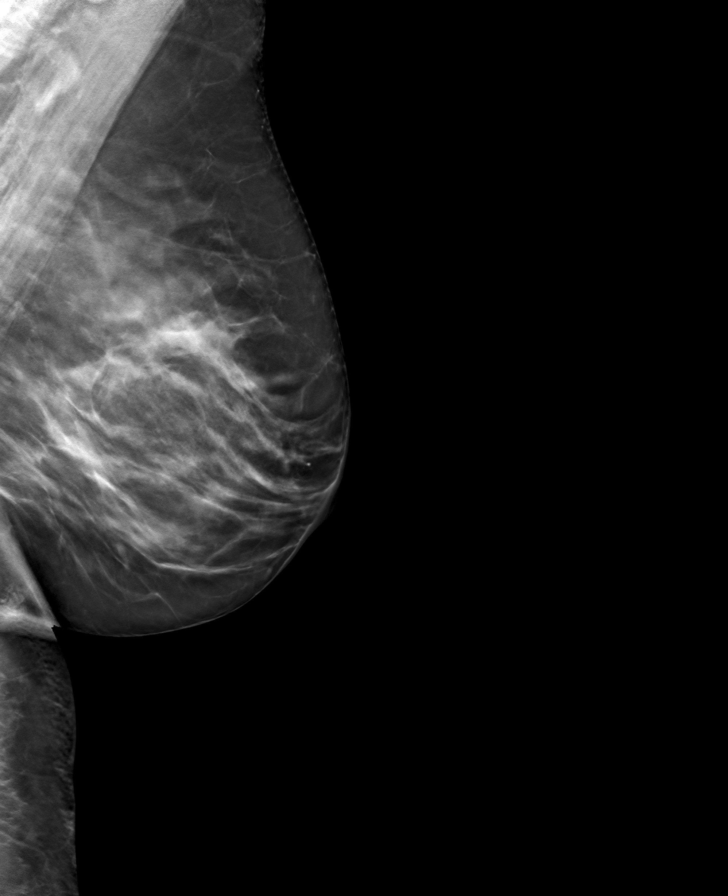

[L CC tomo · tomo slice 43/84.0]
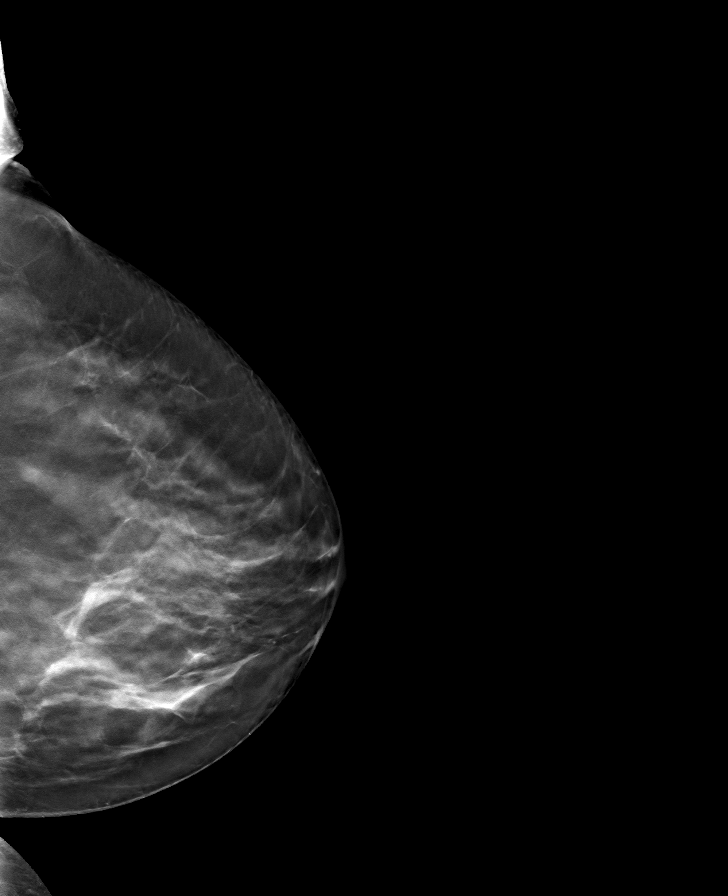

[R CC tomo · tomo slice 39/77.0]
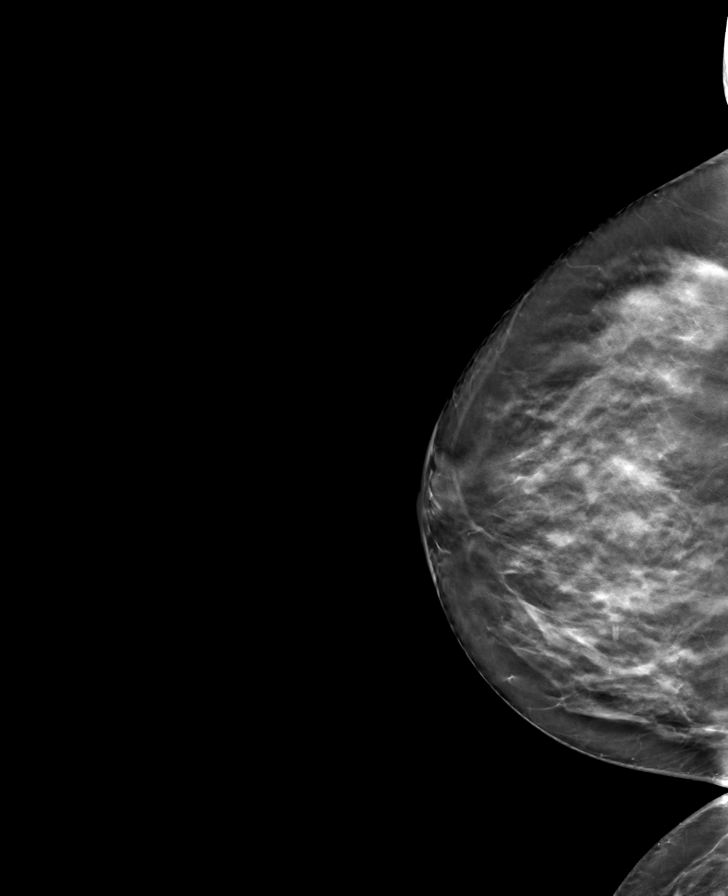

[8 of 24 positions shown; findings below may reference images not displayed]

ACR Breast Density Category c: The breast tissue is heterogeneously
dense, which may obscure small masses.
FINDINGS: There is an oval circumscribed mass containing a coarse, dystrophic
calcification in the posterior inner right breast corresponding to
the mass seen on the recent CT. No findings elsewhere in either
breast suspicious malignancy.

On physical exam, no mass is palpable in the medial right breast.

Targeted ultrasound is performed, showing a 2.0 x 1.6 x 0.8 cm oval,
horizontally oriented, circumscribed, hypoechoic mass in the 3
o'clock position of the right breast, 2 cm from the nipple. This
contains the coarse, dystrophic calcification seen mammographically.
This corresponds to the mass on the recent CT examination.
IMPRESSION: 1. The recently demonstrated mass in the medial right breast is a
benign, degenerated fibroadenoma.
2. No evidence of malignancy in either breast.

RECOMMENDATION:
Bilateral screening mammogram in 1 year.

I have discussed the findings and recommendations with the patient.
If applicable, a reminder letter will be sent to the patient
regarding the next appointment.

BI-RADS CATEGORY  2: Benign.

## 2022-12-15 ENCOUNTER — Ambulatory Visit: Payer: Self-pay | Admitting: Family Medicine

## 2023-02-06 ENCOUNTER — Ambulatory Visit (INDEPENDENT_AMBULATORY_CARE_PROVIDER_SITE_OTHER): Payer: Self-pay | Admitting: Family Medicine

## 2023-02-06 VITALS — BP 120/90 | HR 81 | Temp 98.7°F | Wt 157.0 lb

## 2023-02-06 DIAGNOSIS — F418 Other specified anxiety disorders: Secondary | ICD-10-CM

## 2023-02-06 DIAGNOSIS — I10 Essential (primary) hypertension: Secondary | ICD-10-CM

## 2023-02-06 DIAGNOSIS — F4310 Post-traumatic stress disorder, unspecified: Secondary | ICD-10-CM

## 2023-02-06 MED ORDER — DIAZEPAM 5 MG PO TABS: 5.0000 mg | ORAL_TABLET | Freq: Two times a day (BID) | ORAL | 1 refills | Status: DC
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
## 2023-04-04 ENCOUNTER — Other Ambulatory Visit: Payer: Self-pay | Admitting: Family Medicine

## 2023-04-05 NOTE — Telephone Encounter (Signed)
Pt LOV was on 02/06/23 Last refill was done on 02/06/23 Please advise

## 2023-04-30 ENCOUNTER — Ambulatory Visit (INDEPENDENT_AMBULATORY_CARE_PROVIDER_SITE_OTHER): Payer: Self-pay | Admitting: Family Medicine

## 2023-04-30 VITALS — BP 120/78 | HR 63 | Temp 98.4°F | Wt 163.8 lb

## 2023-04-30 DIAGNOSIS — J019 Acute sinusitis, unspecified: Secondary | ICD-10-CM

## 2023-04-30 DIAGNOSIS — J029 Acute pharyngitis, unspecified: Secondary | ICD-10-CM

## 2023-04-30 DIAGNOSIS — R059 Cough, unspecified: Secondary | ICD-10-CM

## 2023-04-30 LAB — POCT RAPID STREP A (OFFICE): Rapid Strep A Screen: NEGATIVE

## 2023-04-30 LAB — POC COVID19 BINAXNOW: SARS Coronavirus 2 Ag: NEGATIVE

## 2023-04-30 MED ORDER — METHYLPREDNISOLONE ACETATE 40 MG/ML IJ SUSP
40.0000 mg | Freq: Once | INTRAMUSCULAR | Status: AC
  Administered 2023-04-30: 40 mg via INTRAMUSCULAR

## 2023-04-30 MED ORDER — AMOXICILLIN-POT CLAVULANATE 875-125 MG PO TABS: 1.0000 | ORAL_TABLET | Freq: Two times a day (BID) | ORAL | 0 refills | Status: DC

## 2023-04-30 MED ORDER — METHYLPREDNISOLONE ACETATE 80 MG/ML IJ SUSP
80.0000 mg | Freq: Once | INTRAMUSCULAR | Status: AC
  Administered 2023-04-30: 80 mg via INTRAMUSCULAR

## 2023-04-30 NOTE — Progress Notes (Signed)
   Subjective:    Patient ID: [employer], female    DOB: December 28, 1965, 57 y.o.   MRN: [id number]  HPI Here for 2 days of fever, sinus pressure, pain in both ears, PND, and a dry cough. Taking Sudafed.    Review of Systems  Constitutional:  Positive for fever.  HENT:  Positive for congestion, ear pain, postnasal drip, sinus pressure and sore throat.   Eyes: Negative.   Respiratory:  Positive for cough. Negative for shortness of breath and wheezing.        Objective:   Physical Exam Constitutional:      Appearance: She is ill-appearing.  HENT:     Right Ear: Tympanic membrane, ear canal and external ear normal.     Left Ear: Tympanic membrane, ear canal and external ear normal.     Nose: Nose normal.     Mouth/Throat:     Pharynx: Oropharynx is clear.  Eyes:     Conjunctiva/sclera: Conjunctivae normal.  Pulmonary:     Effort: Pulmonary effort is normal.     Breath sounds: Normal breath sounds.  Lymphadenopathy:     Cervical: No cervical adenopathy.  Neurological:     Mental Status: She is alert.           Assessment & Plan:  Sinusitis. Treat with 10 days of Augmentin. Given a shot of DepoMedrol. Gershon Crane, MD

## 2023-05-10 ENCOUNTER — Other Ambulatory Visit: Payer: Self-pay | Admitting: Family Medicine

## 2023-06-15 ENCOUNTER — Other Ambulatory Visit: Payer: Self-pay | Admitting: Family Medicine

## 2023-07-13 ENCOUNTER — Other Ambulatory Visit: Payer: Self-pay | Admitting: Family Medicine

## 2023-08-11 ENCOUNTER — Other Ambulatory Visit: Payer: Self-pay | Admitting: Family Medicine

## 2023-08-19 ENCOUNTER — Other Ambulatory Visit: Payer: Self-pay | Admitting: Family Medicine

## 2023-08-29 ENCOUNTER — Ambulatory Visit: Payer: Medicaid Other | Admitting: Family Medicine

## 2023-08-29 ENCOUNTER — Ambulatory Visit: Payer: BC Managed Care – PPO | Admitting: Family Medicine

## 2023-08-29 ENCOUNTER — Encounter: Payer: Self-pay | Admitting: Family Medicine

## 2023-08-29 VITALS — BP 130/94 | HR 75 | Temp 98.3°F | Wt 169.0 lb

## 2023-08-29 DIAGNOSIS — R200 Anesthesia of skin: Secondary | ICD-10-CM | POA: Diagnosis not present

## 2023-08-29 DIAGNOSIS — F418 Other specified anxiety disorders: Secondary | ICD-10-CM

## 2023-08-29 DIAGNOSIS — I10 Essential (primary) hypertension: Secondary | ICD-10-CM

## 2023-08-29 MED ORDER — VENLAFAXINE HCL ER 150 MG PO CP24: 300.0000 mg | ORAL_CAPSULE | Freq: Every day | ORAL | 11 refills | Status: DC

## 2023-08-29 MED ORDER — METOPROLOL SUCCINATE ER 100 MG PO TB24: 100.0000 mg | ORAL_TABLET | Freq: Every day | ORAL | 11 refills | Status: DC

## 2023-08-29 MED ORDER — LAMOTRIGINE 25 MG PO TABS: 25.0000 mg | ORAL_TABLET | Freq: Every day | ORAL | 2 refills | Status: DC

## 2023-08-29 MED ORDER — DIAZEPAM 5 MG PO TABS: 5.0000 mg | ORAL_TABLET | Freq: Three times a day (TID) | ORAL | 5 refills | Status: DC | PRN

## 2023-08-29 NOTE — Progress Notes (Signed)
   Subjective:    Patient ID: Kim Elliott, female    DOB: Jul 03, 1966, 58 y.o.   MRN: [id number]  HPI Here to follow up on HTN as well as depression with anxiety. She has also been experiencing numbness in the left arm at time for the past 5 days. No chest pain or SOB. She has been extremely stressed at her job, and her neck and back have been very tight. She feels she is harrassed at her job every day, and she feels her superiors are doing nothing about it. She has had truble sleeping, and her appetite is poor.    Review of Systems  Constitutional: Negative.   Respiratory: Negative.    Cardiovascular: Negative.   Musculoskeletal:  Positive for back pain and neck pain.  Neurological:  Positive for numbness and headaches. Negative for dizziness.  Psychiatric/Behavioral:  Positive for decreased concentration, dysphoric mood and sleep disturbance. Negative for agitation, behavioral problems, confusion and hallucinations. The patient is nervous/anxious.        Objective:   Physical Exam Constitutional:      Appearance: Normal appearance.  Cardiovascular:     Rate and Rhythm: Normal rate and regular rhythm.     Pulses: Normal pulses.     Heart sounds: Normal heart sounds.  Pulmonary:     Effort: Pulmonary effort is normal.     Breath sounds: Normal breath sounds.  Musculoskeletal:     Comments: Her posterior neck and back are very tight and tender diffusely   Neurological:     General: No focal deficit present.     Mental Status: She is alert.     Motor: Weakness present.     Gait: Gait abnormal.  Psychiatric:        Behavior: Behavior normal.        Thought Content: Thought content normal.     Comments: Very anxious            Assessment & Plan:  Her HTN is poorly controlled, but I think this is a function of her high stress. She will sty on the Metoprolol. For the depression and anxiety, we will continue the Effexor as it is and we will increase the Valium to TID as  needed. We will also add Lamotrigine 25 mg at bedtime. The left arm numbness appears to be due to pinched nerves in the neck. This should resolve when we get her stress levels better controlled. We will write her out of work today until 09-04-23. We spent a total of ( 33  ) minutes reviewing records and discussing these issues.  Gershon Crane, MD

## 2023-09-03 ENCOUNTER — Encounter: Payer: Self-pay | Admitting: Family Medicine

## 2023-09-03 ENCOUNTER — Ambulatory Visit: Payer: BC Managed Care – PPO | Admitting: Family Medicine

## 2023-09-03 VITALS — BP 124/90 | HR 68 | Temp 98.1°F | Wt 167.0 lb

## 2023-09-03 DIAGNOSIS — I10 Essential (primary) hypertension: Secondary | ICD-10-CM | POA: Diagnosis not present

## 2023-09-03 DIAGNOSIS — F418 Other specified anxiety disorders: Secondary | ICD-10-CM | POA: Diagnosis not present

## 2023-09-03 MED ORDER — LAMOTRIGINE 100 MG PO TABS
100.0000 mg | ORAL_TABLET | Freq: Every day | ORAL | 2 refills | Status: DC
Start: 2023-09-03 — End: 2023-12-10

## 2023-09-03 NOTE — Progress Notes (Signed)
   Subjective:    Patient ID: Kim Elliott, female    DOB: 14-Nov-1965, 58 y.o.   MRN: [id number]  HPI Here to follow up on depression with anxiety and HTN. One week ago we discussed her stressful situation on her job, and we increased her Valium to TID as well as started her on Lamotrigine 25 mg at bedtime. Since then she feels a little better, but she is still not sleeping well, and she feels she is not ready to return to work yet.    Review of Systems  Constitutional: Negative.   Respiratory: Negative.    Cardiovascular: Negative.   Neurological: Negative.   Psychiatric/Behavioral:  Positive for decreased concentration, dysphoric mood and sleep disturbance. Negative for agitation, behavioral problems, confusion and hallucinations. The patient is nervous/anxious.        Objective:   Physical Exam Constitutional:      Appearance: Normal appearance.  Cardiovascular:     Rate and Rhythm: Normal rate and regular rhythm.     Pulses: Normal pulses.     Heart sounds: Normal heart sounds.  Pulmonary:     Effort: Pulmonary effort is normal.     Breath sounds: Normal breath sounds.  Neurological:     General: No focal deficit present.     Mental Status: She is alert and oriented to person, place, and time.  Psychiatric:        Behavior: Behavior normal.        Thought Content: Thought content normal.     Comments: Somewhat anxious.            Assessment & Plan:  Her BP is still borderline. He depression and anxiety are not well controlled. We will increase the Lamotrigine at bedtime to 100 mg. We will extend her work excuse to plan on her returning to work on 09-10-23. She will follow up in 2 weeks.  Gershon Crane, MD

## 2023-09-17 ENCOUNTER — Ambulatory Visit: Admitting: Family Medicine

## 2023-09-19 ENCOUNTER — Ambulatory Visit: Admitting: Family Medicine

## 2023-09-25 ENCOUNTER — Ambulatory Visit: Payer: Self-pay

## 2023-09-25 ENCOUNTER — Emergency Department (HOSPITAL_COMMUNITY)

## 2023-09-25 ENCOUNTER — Emergency Department (HOSPITAL_COMMUNITY)
Admission: EM | Admit: 2023-09-25 | Discharge: 2023-09-25 | Disposition: A | Discharge status: Home / Self Care | Attending: Emergency Medicine | Admitting: Emergency Medicine

## 2023-09-25 DIAGNOSIS — J45909 Unspecified asthma, uncomplicated: Secondary | ICD-10-CM | POA: Insufficient documentation

## 2023-09-25 DIAGNOSIS — U071 COVID-19: Secondary | ICD-10-CM | POA: Insufficient documentation

## 2023-09-25 DIAGNOSIS — I10 Essential (primary) hypertension: Secondary | ICD-10-CM | POA: Insufficient documentation

## 2023-09-25 DIAGNOSIS — R0989 Other specified symptoms and signs involving the circulatory and respiratory systems: Secondary | ICD-10-CM | POA: Diagnosis not present

## 2023-09-25 DIAGNOSIS — R5381 Other malaise: Secondary | ICD-10-CM | POA: Diagnosis not present

## 2023-09-25 DIAGNOSIS — R000 Tachycardia, unspecified: Secondary | ICD-10-CM | POA: Diagnosis not present

## 2023-09-25 DIAGNOSIS — Z79899 Other long term (current) drug therapy: Secondary | ICD-10-CM | POA: Diagnosis not present

## 2023-09-25 DIAGNOSIS — R509 Fever, unspecified: Secondary | ICD-10-CM | POA: Diagnosis not present

## 2023-09-25 DIAGNOSIS — J069 Acute upper respiratory infection, unspecified: Secondary | ICD-10-CM | POA: Diagnosis not present

## 2023-09-25 DIAGNOSIS — R059 Cough, unspecified: Secondary | ICD-10-CM | POA: Diagnosis not present

## 2023-09-25 LAB — CBC WITH DIFFERENTIAL/PLATELET
Abs Immature Granulocytes: 0.02 10*3/uL (ref 0.00–0.07)
Basophils Absolute: 0 10*3/uL (ref 0.0–0.1)
Basophils Relative: 0 %
Eosinophils Absolute: 0.1 10*3/uL (ref 0.0–0.5)
Eosinophils Relative: 2 %
HCT: 45.1 % (ref 36.0–46.0)
Hemoglobin: 15 g/dL (ref 12.0–15.0)
Immature Granulocytes: 0 %
Lymphocytes Relative: 18 %
Lymphs Abs: 1 10*3/uL (ref 0.7–4.0)
MCH: 32.3 pg (ref 26.0–34.0)
MCHC: 33.3 g/dL (ref 30.0–36.0)
MCV: 97 fL (ref 80.0–100.0)
Monocytes Absolute: 0.5 10*3/uL (ref 0.1–1.0)
Monocytes Relative: 9 %
Neutro Abs: 3.7 10*3/uL (ref 1.7–7.7)
Neutrophils Relative %: 71 %
Platelets: 195 10*3/uL (ref 150–400)
RBC: 4.65 MIL/uL (ref 3.87–5.11)
RDW: 13 % (ref 11.5–15.5)
WBC: 5.4 10*3/uL (ref 4.0–10.5)
nRBC: 0 % (ref 0.0–0.2)

## 2023-09-25 LAB — COMPREHENSIVE METABOLIC PANEL
ALT: 51 U/L — ABNORMAL HIGH (ref 0–44)
AST: 53 U/L — ABNORMAL HIGH (ref 15–41)
Albumin: 4.2 g/dL (ref 3.5–5.0)
Alkaline Phosphatase: 84 U/L (ref 38–126)
Anion gap: 9 (ref 5–15)
BUN: 9 mg/dL (ref 6–20)
CO2: 23 mmol/L (ref 22–32)
Calcium: 9.5 mg/dL (ref 8.9–10.3)
Chloride: 107 mmol/L (ref 98–111)
Creatinine, Ser: 0.63 mg/dL (ref 0.44–1.00)
GFR, Estimated: >60 mL/min (ref >60)
Glucose, Bld: 133 mg/dL — ABNORMAL HIGH (ref 70–99)
Potassium: 3.7 mmol/L (ref 3.5–5.1)
Sodium: 139 mmol/L (ref 135–145)
Total Bilirubin: 0.6 mg/dL (ref 0.0–1.2)
Total Protein: 8 g/dL (ref 6.5–8.1)

## 2023-09-25 LAB — RESP PANEL BY RT-PCR (RSV, FLU A&B, COVID)  RVPGX2
Influenza A by PCR: NEGATIVE
Influenza B by PCR: NEGATIVE
Resp Syncytial Virus by PCR: NEGATIVE
SARS Coronavirus 2 by RT PCR: POSITIVE — AB

## 2023-09-25 MED ORDER — SODIUM CHLORIDE 0.9 % IV BOLUS
1000.0000 mL | Freq: Once | INTRAVENOUS | Status: AC
  Administered 2023-09-25: 1000 mL via INTRAVENOUS

## 2023-09-25 MED ORDER — ACETAMINOPHEN 500 MG PO TABS
1000.0000 mg | ORAL_TABLET | Freq: Once | ORAL | Status: AC
  Administered 2023-09-25: 1000 mg via ORAL
  Filled 2023-09-25: qty 2

## 2023-09-25 MED ORDER — KETOROLAC TROMETHAMINE 30 MG/ML IJ SOLN
30.0000 mg | Freq: Once | INTRAMUSCULAR | Status: AC
  Administered 2023-09-25: 30 mg via INTRAVENOUS
  Filled 2023-09-25: qty 1

## 2023-09-25 NOTE — Telephone Encounter (Signed)
 Copied from CRM [id number]. Topic: Clinical - Red Word Triage >> Sep 25, 2023  7:58 AM Gurney Maxin H wrote: [facility] that prompted transfer to Nurse Triage: Fever, coughing up yellow mucus, can barely talk throat on fire, headache, chest pain    Chief Complaint: Cough with yellow mucus, body aches, headache, sore throat. Pt. Requests to see Dr. Clent Ridges only - he no availability until April. Asking to be worked in with him today. Symptoms: Above Frequency: Sunday Pertinent Negatives: Patient denies SOB Disposition: [] ED /[] Urgent Care (no appt availability in office) / [] Appointment(In office/virtual)/ []  [facility]/ [] Home Care/ [x] Refused Recommended Disposition /[] [facility]/ []  Follow-up with PCP Additional Notes: Please advise pt.  Reason for Disposition  [1] Continuous (nonstop) coughing interferes with work or school AND [2] no improvement using cough treatment per Care Advice  Answer Assessment - Initial Assessment Questions 1. ONSET: "When did the cough begin?"      Sunday 2. SEVERITY: "How bad is the cough today?"      Moderate 3. SPUTUM: "Describe the color of your sputum" (none, dry cough; clear, white, yellow, green)     Yellow 4. HEMOPTYSIS: "Are you coughing up any blood?" If so ask: "How much?" (flecks, streaks, tablespoons, etc.)     No 5. DIFFICULTY BREATHING: "Are you having difficulty breathing?" If Yes, ask: "How bad is it?" (e.g., mild, moderate, severe)    - MILD: No SOB at rest, mild SOB with walking, speaks normally in sentences, can lie down, no retractions, pulse < 100.    - MODERATE: SOB at rest, SOB with minimal exertion and prefers to sit, cannot lie down flat, speaks in phrases, mild retractions, audible wheezing, pulse 100-120.    - SEVERE: Very SOB at rest, speaks in single words, struggling to breathe, sitting hunched forward, retractions, pulse > 120      No 6. FEVER: "Do you have a fever?" If Yes, ask: "What is your temperature, how was  it measured, and when did it start?"     Yes - sweating, chills 7. CARDIAC HISTORY: "Do you have any history of heart disease?" (e.g., heart attack, congestive heart failure)      Yes - HTN 8. LUNG HISTORY: "Do you have any history of lung disease?"  (e.g., pulmonary embolus, asthma, emphysema)     Asthma 9. PE RISK FACTORS: "Do you have a history of blood clots?" (or: recent major surgery, recent prolonged travel, bedridden)     No 10. OTHER SYMPTOMS: "Do you have any other symptoms?" (e.g., runny nose, wheezing, chest pain)       Chest pain,headache 11. PREGNANCY: "Is there any chance you are pregnant?" "When was your last menstrual period?"       No 12. TRAVEL: "Have you traveled out of the country in the last month?" (e.g., travel history, exposures)       No  Protocols used: Cough - Acute Productive-A-AH

## 2023-09-25 NOTE — ED Triage Notes (Signed)
 Pt c/o URI symptoms including cough, congestion, tactile fevers, malaise, and fatigue since Sunday.

## 2023-09-25 NOTE — Telephone Encounter (Signed)
 Spoke with pt declined seeing another Dr since Dr Clent Ridges is out of the office, pt was advised to go to [facility] or ED. Pt agreed to go to the ED

## 2023-09-25 NOTE — ED Provider Notes (Signed)
 [place] EMERGENCY DEPARTMENT AT [facility] Provider Note   CSN: [id number] Arrival date & time: 09/25/23  4098     History  Chief Complaint  Patient presents with   URI    Kim Elliott is a 58 y.o. female.  With a history of asthma and hypertension who presents to the ED for coughing.  3 days of ongoing productive coughing, fevers, shortness of breath and general malaise.  Reports that her sputum has been yellow in color.  No recent sick contacts.  Denies chest pain nausea vomiting change in bowel habits and urinary symptoms.   URI      Home Medications Prior to Admission medications   Medication Sig Start Date End Date Taking? Authorizing Provider  albuterol (VENTOLIN HFA) 108 (90 Base) MCG/ACT inhaler Inhale 2 puffs into the lungs every 4 (four) hours as needed for wheezing or shortness of breath. 04/25/22   Nelwyn Salisbury, MD  Ascorbic Acid (VITAMIN C) 1000 MG tablet Take 1,000 mg by mouth daily.    [provider]  Biotin w/ Vitamins C & E (HAIR/SKIN/NAILS PO) Take by mouth.    [provider]  Chlorpheniramine Maleate (ALLERGY PO) Take by mouth.    [provider]  COLLAGEN PO Take by mouth.    [provider]  diazepam (VALIUM) 5 MG tablet Take 1 tablet (5 mg total) by mouth every 8 (eight) hours as needed for anxiety. 08/29/23   Nelwyn Salisbury, MD  EVENING PRIMROSE OIL PO Take by mouth.    [provider]  lamoTRIgine (LAMICTAL) 100 MG tablet Take 1 tablet (100 mg total) by mouth daily. 09/03/23   Nelwyn Salisbury, MD  metoprolol succinate (TOPROL-XL) 100 MG 24 hr tablet Take 1 tablet (100 mg total) by mouth daily. Take with or immediately following a meal. 08/29/23   Nelwyn Salisbury, MD  Omega-3 Fatty Acids (FISH OIL PO) Take by mouth.    [provider]  venlafaxine XR (EFFEXOR-XR) 150 MG 24 hr capsule Take 2 capsules (300 mg total) by mouth daily. 08/29/23   Nelwyn Salisbury, MD  VITAMIN E PO Take by mouth  daily.    [provider]      Allergies    Codeine, Oxycodone, and Vicodin [hydrocodone-acetaminophen]    Review of Systems   Review of Systems  Physical Exam Updated Vital Signs BP (!) 160/101   Pulse 84   Temp 98.5 F (36.9 C)   Resp 18   SpO2 100%  Physical Exam Vitals and nursing note reviewed.  HENT:     Head: Normocephalic and atraumatic.  Eyes:     Pupils: Pupils are equal, round, and reactive to light.  Cardiovascular:     Rate and Rhythm: Normal rate and regular rhythm.  Pulmonary:     Effort: Pulmonary effort is normal.     Breath sounds: Normal breath sounds.  Abdominal:     Palpations: Abdomen is soft.     Tenderness: There is no abdominal tenderness.  Skin:    General: Skin is warm and dry.  Neurological:     Mental Status: She is alert.  Psychiatric:        Mood and Affect: Mood normal.     ED Results / Procedures / Treatments   Labs (all labs ordered are listed, but only abnormal results are displayed) Labs Reviewed  RESP PANEL BY RT-PCR (RSV, FLU A&B, COVID)  RVPGX2 - Abnormal; Notable for the following components:  Result Value   SARS Coronavirus 2 by RT PCR POSITIVE (*)    All other components within normal limits  COMPREHENSIVE METABOLIC PANEL - Abnormal; Notable for the following components:   Glucose, Bld 133 (*)    AST 53 (*)    ALT 51 (*)    All other components within normal limits  CBC WITH DIFFERENTIAL/PLATELET    EKG EKG Interpretation Date/Time:  Tuesday September 25 2023 09:18:57 EDT Ventricular Rate:  105 PR Interval:  146 QRS Duration:  83 QT Interval:  329 QTC Calculation: 435 R Axis:   0  Text Interpretation: Sinus tachycardia Probable left atrial enlargement Low voltage, precordial leads Consider anterior infarct Confirmed by Estelle June [phone]) on 09/25/2023 10:28:02 AM  Radiology No results found.  Procedures Procedures    Medications Ordered in ED Medications  acetaminophen (TYLENOL) tablet  1,000 mg (has no administration in time range)  ketorolac (TORADOL) 30 MG/ML injection 30 mg (has no administration in time range)  sodium chloride 0.9 % bolus 1,000 mL (1,000 mLs Intravenous New Bag/Given 09/25/23 6010)    ED Course/ Medical Decision Making/ A&P Clinical Course as of 09/25/23 1113  Tue Sep 25, 2023  1112 Laboratory workup reveals no leukocytosis.  No significant abnormalities on CMP.  No focal consolidation on chest x-ray.  Respiratory panel positive for COVID.  Suspect this is most likely etiology of patient's presentation today  Counseled her on symptomatic management with acetaminophen, NSAIDs and adequate hydration at home We will give her a dose of Tylenol and Toradol here Will provide her with a work note Return precautions discussed in detail [MP]    Clinical Course User Index [MP] Kim Foots, DO                                 Medical Decision Making 57 year old female with history as above presenting for productive coughing fevers general malaise x 3 days.  Tachycardic upon arrival slightly hypertensive.  Did not take her morning metoprolol.  Presentation concerning for viral respiratory illness versus pneumonia.  Low suspicion for asthma exacerbation as she has not had any wheezing or required albuterol at home.  Lower suspicion for pulmonary embolism but will obtain D-dimer if her viral panel is negative and there is no evidence of pneumonia.  Will provide IV fluids for rehydration and continue to monitor  Amount and/or Complexity of Data Reviewed Labs: ordered. Radiology: ordered.  Risk OTC drugs. Prescription drug management.           Final Clinical Impression(s) / ED Diagnoses Final diagnoses:  COVID    Rx / DC Orders ED Discharge Orders     None         Kim Foots, DO 09/25/23 1113

## 2023-09-25 NOTE — Discharge Instructions (Signed)
 You were seen in the emergency department for cough and congestion and fevers You tested positive for COVID We do not think this is due to pneumonia at this time For your COVID symptoms take Tylenol or Motrin as directed for pain discomfort Keep well-hydrated Follow-up with your primary care doctor in 1 week for reevaluation Return to the emergency department for trouble breathing or any other concerns

## 2023-10-01 ENCOUNTER — Ambulatory Visit (INDEPENDENT_AMBULATORY_CARE_PROVIDER_SITE_OTHER): Admitting: Family Medicine

## 2023-10-01 ENCOUNTER — Encounter: Payer: Self-pay | Admitting: Family Medicine

## 2023-10-01 VITALS — BP 136/82 | HR 65 | Temp 98.3°F | Wt 167.0 lb

## 2023-10-01 DIAGNOSIS — R748 Abnormal levels of other serum enzymes: Secondary | ICD-10-CM

## 2023-10-01 DIAGNOSIS — M79622 Pain in left upper arm: Secondary | ICD-10-CM

## 2023-10-01 DIAGNOSIS — U071 COVID-19: Secondary | ICD-10-CM

## 2023-10-01 MED ORDER — METHYLPREDNISOLONE 4 MG PO TBPK: ORAL_TABLET | ORAL | 0 refills | Status: DC

## 2023-10-01 MED ORDER — CYCLOBENZAPRINE HCL 10 MG PO TABS: 10.0000 mg | ORAL_TABLET | Freq: Three times a day (TID) | ORAL | 1 refills | Status: DC | PRN

## 2023-10-01 NOTE — Progress Notes (Signed)
   Subjective:    Patient ID: Kim Elliott, female    DOB: 1965-07-30, 58 y.o.   MRN: [id number]  HPI Here to follow up an ED visit on 09-25-23, when s he presented with a cough producing yellow sputum. She also had body aches, headache, and fatigue. Her CXR was clear. Her labs were all normal except for mildly elevated transaminases. The AST was 53 and the ALT was 51. She tested positive for Covid, so she went home to rest and drink fluids. The cough has improved since then, but she is still very fatigued and achy. She also complains of a sharp pain in the left upper arm that started 2 days ago. No recent trauma. She also has stiffness and pain in the neck. No swelling in the arm or hand.    Review of Systems  Constitutional:  Positive for fatigue. Negative for fever.  Respiratory: Negative.    Cardiovascular: Negative.   Gastrointestinal: Negative.   Musculoskeletal:  Positive for myalgias, neck pain and neck stiffness.       Objective:   Physical Exam Constitutional:      Appearance: Normal appearance. She is ill-appearing.  HENT:     Right Ear: Tympanic membrane, ear canal and external ear normal.     Left Ear: Tympanic membrane, ear canal and external ear normal.     Nose: Nose normal.     Mouth/Throat:     Pharynx: Oropharynx is clear.  Eyes:     Conjunctiva/sclera: Conjunctivae normal.  Cardiovascular:     Rate and Rhythm: Normal rate and regular rhythm.     Pulses: Normal pulses.     Heart sounds: Normal heart sounds.  Pulmonary:     Effort: Pulmonary effort is normal.     Breath sounds: Normal breath sounds.  Musculoskeletal:     Comments: The left upper arm is tender. No swelling in the left arm or hand. She is tender in the left posterior neck and left upper back   Lymphadenopathy:     Cervical: No cervical adenopathy.  Neurological:     Mental Status: She is alert.           Assessment & Plan:  Covid infection. She will continue to rest and drink fluids.  The left arm pain appears to be caused by a pinched nerve in the neck. She will try ice, Flexeril, and a Medrol dose pack. We will recheck a liver panel today to follow the transaminase levels. We spent a total of ( 35  ) minutes reviewing records and discussing these issues.   Gershon Crane, MD

## 2023-10-02 ENCOUNTER — Encounter: Payer: Self-pay | Admitting: Family Medicine

## 2023-10-02 LAB — HEPATIC FUNCTION PANEL
ALT: 89 U/L — ABNORMAL HIGH (ref 0–35)
AST: 61 U/L — ABNORMAL HIGH (ref 0–37)
Albumin: 4.7 g/dL (ref 3.5–5.2)
Alkaline Phosphatase: 90 U/L (ref 39–117)
Bilirubin, Direct: 0.1 mg/dL (ref 0.0–0.3)
Total Bilirubin: 0.4 mg/dL (ref 0.2–1.2)
Total Protein: 8.1 g/dL (ref 6.0–8.3)

## 2023-10-03 NOTE — Addendum Note (Signed)
 Addended by: Gershon Crane A on: 10/03/2023 01:13 PM   Modules accepted: Orders

## 2023-10-04 ENCOUNTER — Telehealth: Payer: Self-pay | Admitting: *Deleted

## 2023-10-04 ENCOUNTER — Other Ambulatory Visit

## 2023-10-04 ENCOUNTER — Other Ambulatory Visit: Payer: Self-pay | Admitting: *Deleted

## 2023-10-04 ENCOUNTER — Other Ambulatory Visit: Payer: Self-pay | Admitting: Family Medicine

## 2023-10-04 DIAGNOSIS — R748 Abnormal levels of other serum enzymes: Secondary | ICD-10-CM

## 2023-10-04 NOTE — Telephone Encounter (Signed)
 Copied from CRM [id number]. Topic: Clinical - Lab/Test Results >> Oct 04, 2023 10:16 AM Saverio Danker wrote: Reason for CRM: Patient is needing a call about her latest lab results so she can notify her job if she's coming back to work.  Patient would like a call as soon as possible

## 2023-10-04 NOTE — Telephone Encounter (Signed)
Spoke to patient.  See lab result note. 

## 2023-10-05 LAB — EPSTEIN-BARR VIRUS VCA, IGM: EBV VCA IgM: <36 U/mL

## 2023-10-05 LAB — CYTOMEGALOVIRUS ANTIBODY, IGG: Cytomegalovirus Ab-IgG: 9.4 U/mL — ABNORMAL HIGH

## 2023-10-05 LAB — HEPATITIS C ANTIBODY: Hepatitis C Ab: NONREACTIVE

## 2023-10-05 LAB — CMV IGM: CMV IgM: <30 [AU]/ml

## 2023-10-05 LAB — EPSTEIN-BARR VIRUS VCA, IGG: EBV VCA IgG: 139 U/mL — ABNORMAL HIGH

## 2023-10-05 NOTE — Telephone Encounter (Signed)
 Noted.

## 2023-10-08 ENCOUNTER — Telehealth: Payer: Self-pay

## 2023-10-08 NOTE — Telephone Encounter (Signed)
 Copied from CRM [id number]. Topic: Clinical - Lab/Test Results >> Oct 08, 2023  2:56 PM Fonda Kinder J wrote: Reason for CRM: Pts results came back and I relayed the message from Dr.Fry to the pt. The pt states she would like to receive a f/u call to discuss next steps regarding her labs

## 2023-10-09 ENCOUNTER — Encounter: Payer: Self-pay | Admitting: Obstetrics and Gynecology

## 2023-10-09 ENCOUNTER — Ambulatory Visit (INDEPENDENT_AMBULATORY_CARE_PROVIDER_SITE_OTHER): Payer: BC Managed Care – PPO | Admitting: Obstetrics and Gynecology

## 2023-10-09 ENCOUNTER — Other Ambulatory Visit (HOSPITAL_COMMUNITY)
Admission: RE | Admit: 2023-10-09 | Discharge: 2023-10-09 | Disposition: A | Discharge status: Home / Self Care | Source: Ambulatory Visit | Attending: Obstetrics and Gynecology | Admitting: Obstetrics and Gynecology

## 2023-10-09 VITALS — BP 160/104 | HR 91 | Ht 63.5 in | Wt 164.0 lb

## 2023-10-09 DIAGNOSIS — D219 Benign neoplasm of connective and other soft tissue, unspecified: Secondary | ICD-10-CM | POA: Diagnosis not present

## 2023-10-09 DIAGNOSIS — Z8742 Personal history of other diseases of the female genital tract: Secondary | ICD-10-CM | POA: Insufficient documentation

## 2023-10-09 DIAGNOSIS — Z01419 Encounter for gynecological examination (general) (routine) without abnormal findings: Secondary | ICD-10-CM | POA: Insufficient documentation

## 2023-10-09 DIAGNOSIS — Z1331 Encounter for screening for depression: Secondary | ICD-10-CM | POA: Diagnosis not present

## 2023-10-09 DIAGNOSIS — N8003 Adenomyosis of the uterus: Secondary | ICD-10-CM

## 2023-10-09 DIAGNOSIS — E2839 Other primary ovarian failure: Secondary | ICD-10-CM | POA: Diagnosis not present

## 2023-10-09 DIAGNOSIS — Z1231 Encounter for screening mammogram for malignant neoplasm of breast: Secondary | ICD-10-CM

## 2023-10-09 DIAGNOSIS — Z1211 Encounter for screening for malignant neoplasm of colon: Secondary | ICD-10-CM

## 2023-10-09 NOTE — Telephone Encounter (Signed)
 Pt has appointment scheduled with Dr Clent Ridges for 10/10/23. Will review labs at the visit
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
## 2023-10-09 NOTE — Telephone Encounter (Signed)
 Pt has a f/u appointment with Dr Clent Ridges on 10/10/23

## 2023-10-10 ENCOUNTER — Ambulatory Visit: Admitting: Family Medicine

## 2023-10-10 ENCOUNTER — Encounter: Payer: Self-pay | Admitting: Family Medicine

## 2023-10-10 ENCOUNTER — Encounter: Payer: Self-pay | Admitting: *Deleted

## 2023-10-10 VITALS — BP 102/78 | HR 86 | Temp 98.4°F | Wt 167.8 lb

## 2023-10-10 DIAGNOSIS — F418 Other specified anxiety disorders: Secondary | ICD-10-CM

## 2023-10-10 DIAGNOSIS — I10 Essential (primary) hypertension: Secondary | ICD-10-CM | POA: Diagnosis not present

## 2023-10-10 DIAGNOSIS — R1010 Upper abdominal pain, unspecified: Secondary | ICD-10-CM | POA: Diagnosis not present

## 2023-10-10 MED ORDER — METOPROLOL SUCCINATE ER 100 MG PO TB24: 100.0000 mg | ORAL_TABLET | Freq: Two times a day (BID) | ORAL | 3 refills | Status: DC

## 2023-10-10 MED ORDER — OMEPRAZOLE 40 MG PO CPDR: 40.0000 mg | DELAYED_RELEASE_CAPSULE | Freq: Every day | ORAL | 3 refills | Status: DC

## 2023-10-10 NOTE — Progress Notes (Signed)
   Subjective:    Patient ID: Kim Elliott, female    DOB: 11-Sep-1965, 58 y.o.   MRN: [id number]  HPI Here to follow up on HTN and to discuss upper abdominal pains that started several weeks ago. These pains are in the epigastrium and the LUQ. They come and go, and they have no relationship to eating. No nausea . Her appetite is normal. Her BM's have been normal. As noted in her chart, she has been dealing with a lot of stress from her job, and we have been adjusting her medications to help with this. This has also contributed to high BP readings. She was seen by her GYN yesterday morning, and her BP was 160/104. She is taking Metoprolol succinate 100 mg once daily in the mornings. She is taking Effexor, Lamictal, and Valium for the anxiety and depression (all in the mornings). She sleeps well.    Review of Systems  Constitutional: Negative.   Respiratory: Negative.    Cardiovascular: Negative.   Gastrointestinal:  Positive for abdominal distention, abdominal pain and nausea. Negative for blood in stool, constipation, diarrhea and vomiting.  Neurological: Negative.   Psychiatric/Behavioral:  Positive for decreased concentration and dysphoric mood. Negative for agitation, behavioral problems, confusion and sleep disturbance. The patient is nervous/anxious.        Objective:   Physical Exam Constitutional:      Appearance: Normal appearance.  Cardiovascular:     Rate and Rhythm: Normal rate and regular rhythm.     Pulses: Normal pulses.     Heart sounds: Normal heart sounds.  Pulmonary:     Effort: Pulmonary effort is normal.     Breath sounds: Normal breath sounds.  Abdominal:     General: Abdomen is flat. Bowel sounds are normal. There is no distension.     Palpations: Abdomen is soft. There is no mass.     Tenderness: There is abdominal tenderness. There is no guarding or rebound.     Hernia: No hernia is present.     Comments: Tender in the epigastrium and the LUQ   Neurological:     General: No focal deficit present.     Mental Status: She is alert and oriented to person, place, and time.  Psychiatric:        Behavior: Behavior normal.        Thought Content: Thought content normal.     Comments: Anxious            Assessment & Plan:  Her anxiety and depression are fairly well controlled now, so we will keep these meds the same. She has labile HTN, and her stress no doubt contributes to this. We will increase the Metoprolol succinate 100 mg to BID. She will monitor the BP at home and return in 2 weeks. Her upper abdominal pain is likely due to gastritis, so we will treat this with Omeprazole 40 mg daily. We will also order an abdominal US.  Gershon Crane, MD

## 2023-10-12 LAB — CYTOLOGY - PAP: Diagnosis: NEGATIVE

## 2023-10-15 ENCOUNTER — Encounter: Payer: Self-pay | Admitting: Obstetrics and Gynecology

## 2023-10-18 ENCOUNTER — Ambulatory Visit
Admission: RE | Admit: 2023-10-18 | Discharge: 2023-10-18 | Disposition: A | Discharge status: Home / Self Care | Source: Ambulatory Visit | Attending: Family Medicine | Admitting: Family Medicine

## 2023-10-18 DIAGNOSIS — R1010 Upper abdominal pain, unspecified: Secondary | ICD-10-CM | POA: Diagnosis not present

## 2023-10-18 DIAGNOSIS — R1030 Lower abdominal pain, unspecified: Secondary | ICD-10-CM | POA: Diagnosis not present

## 2023-10-18 DIAGNOSIS — K769 Liver disease, unspecified: Secondary | ICD-10-CM | POA: Diagnosis not present

## 2023-10-24 ENCOUNTER — Ambulatory Visit: Admitting: Family Medicine

## 2023-11-06 ENCOUNTER — Encounter: Payer: Self-pay | Admitting: Internal Medicine

## 2023-11-07 ENCOUNTER — Ambulatory Visit: Admitting: Family Medicine

## 2023-11-14 ENCOUNTER — Other Ambulatory Visit: Admitting: Obstetrics and Gynecology

## 2023-11-14 ENCOUNTER — Other Ambulatory Visit

## 2023-12-10 ENCOUNTER — Ambulatory Visit (INDEPENDENT_AMBULATORY_CARE_PROVIDER_SITE_OTHER): Admitting: Family Medicine

## 2023-12-10 ENCOUNTER — Encounter: Payer: Self-pay | Admitting: Family Medicine

## 2023-12-10 VITALS — BP 124/86 | HR 76 | Temp 97.8°F | Wt 164.0 lb

## 2023-12-10 DIAGNOSIS — M2550 Pain in unspecified joint: Secondary | ICD-10-CM

## 2023-12-10 DIAGNOSIS — L989 Disorder of the skin and subcutaneous tissue, unspecified: Secondary | ICD-10-CM | POA: Diagnosis not present

## 2023-12-10 DIAGNOSIS — I83813 Varicose veins of bilateral lower extremities with pain: Secondary | ICD-10-CM | POA: Diagnosis not present

## 2023-12-10 DIAGNOSIS — F418 Other specified anxiety disorders: Secondary | ICD-10-CM

## 2023-12-10 LAB — BASIC METABOLIC PANEL WITH GFR
BUN: 14 mg/dL (ref 6–23)
CO2: 27 meq/L (ref 19–32)
Calcium: 10 mg/dL (ref 8.4–10.5)
Chloride: 101 meq/L (ref 96–112)
Creatinine, Ser: 0.71 mg/dL (ref 0.40–1.20)
GFR: 94.84 mL/min (ref >60.00)
Glucose, Bld: 86 mg/dL (ref 70–99)
Potassium: 3.9 meq/L (ref 3.5–5.1)
Sodium: 138 meq/L (ref 135–145)

## 2023-12-10 LAB — SEDIMENTATION RATE: Sed Rate: 21 mm/h (ref 0–30)

## 2023-12-10 LAB — CBC WITH DIFFERENTIAL/PLATELET
Basophils Absolute: 0 10*3/uL (ref 0.0–0.1)
Basophils Relative: 0.7 % (ref 0.0–3.0)
Eosinophils Absolute: 0 10*3/uL (ref 0.0–0.7)
Eosinophils Relative: 0 % (ref 0.0–5.0)
HCT: 43.5 % (ref 36.0–46.0)
Hemoglobin: 14.7 g/dL (ref 12.0–15.0)
Lymphocytes Relative: 40.4 % (ref 12.0–46.0)
Lymphs Abs: 2 10*3/uL (ref 0.7–4.0)
MCHC: 33.9 g/dL (ref 30.0–36.0)
MCV: 92 fl (ref 78.0–100.0)
Monocytes Absolute: 0.3 10*3/uL (ref 0.1–1.0)
Monocytes Relative: 6.2 % (ref 3.0–12.0)
Neutro Abs: 2.6 10*3/uL (ref 1.4–7.7)
Neutrophils Relative %: 52.7 % (ref 43.0–77.0)
Platelets: 249 10*3/uL (ref 150.0–400.0)
RBC: 4.73 Mil/uL (ref 3.87–5.11)
RDW: 13.9 % (ref 11.5–15.5)
WBC: 4.9 10*3/uL (ref 4.0–10.5)

## 2023-12-10 LAB — C-REACTIVE PROTEIN: CRP: <1 mg/dL (ref 0.5–20.0)

## 2023-12-10 MED ORDER — CELECOXIB 200 MG PO CAPS: 200.0000 mg | ORAL_CAPSULE | Freq: Two times a day (BID) | ORAL | 2 refills | Status: DC
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
## 2023-12-11 ENCOUNTER — Ambulatory Visit: Payer: Self-pay | Admitting: Family Medicine

## 2023-12-12 LAB — ANTI-NUCLEAR AB-TITER (ANA TITER): ANA Titer 1: 1:320 {titer} — ABNORMAL HIGH

## 2023-12-12 LAB — ANA: Anti Nuclear Antibody (ANA): POSITIVE — AB

## 2023-12-12 LAB — RHEUMATOID FACTOR: Rheumatoid fact SerPl-aCnc: <10 [IU]/mL (ref <14)

## 2023-12-14 ENCOUNTER — Encounter: Payer: Self-pay | Admitting: Family Medicine

## 2023-12-14 MED ORDER — PREDNISONE 10 MG PO TABS: 10.0000 mg | ORAL_TABLET | Freq: Two times a day (BID) | ORAL | 0 refills | Status: DC

## 2023-12-14 NOTE — Addendum Note (Signed)
 Addended by: Corita Diego A on: 12/14/2023 03:28 PM   Modules accepted: Orders

## 2023-12-17 NOTE — Addendum Note (Signed)
 Addended by: Corita Diego A on: 12/17/2023 01:35 PM   Modules accepted: Orders

## 2023-12-17 NOTE — Telephone Encounter (Signed)
 As far as the Rheumatology visit, I know everyone in the area is backed up right now. I suggest Tacha call and get on a cancellation list. The other referrals are still pending

## 2023-12-28 ENCOUNTER — Ambulatory Visit (AMBULATORY_SURGERY_CENTER)

## 2023-12-28 ENCOUNTER — Encounter: Payer: Self-pay | Admitting: Internal Medicine

## 2023-12-28 VITALS — Ht 63.5 in | Wt 164.0 lb

## 2023-12-28 DIAGNOSIS — Z1211 Encounter for screening for malignant neoplasm of colon: Secondary | ICD-10-CM

## 2023-12-28 MED ORDER — NA SULFATE-K SULFATE-MG SULF 17.5-3.13-1.6 GM/177ML PO SOLN
1.0000 | Freq: Once | ORAL | 0 refills | Status: AC
Start: 2023-12-28 — End: 2023-12-28

## 2023-12-28 NOTE — Progress Notes (Signed)

## 2024-01-08 ENCOUNTER — Telehealth: Payer: Self-pay | Admitting: Gastroenterology

## 2024-01-08 NOTE — Telephone Encounter (Signed)
 Good morning Dr. Albertus, this patient called and stated that she has to cancel her appointment for a colonoscopy that was schedule for July the 14 th due to her not being able to stop taking her medication for her lupus due to her being in a lot of pain. Patient did stated that she will call back to reschedule once she find a way to control her lupus.

## 2024-01-14 ENCOUNTER — Encounter: Admitting: Internal Medicine

## 2024-01-22 ENCOUNTER — Encounter: Payer: Self-pay | Admitting: Family Medicine

## 2024-01-22 ENCOUNTER — Ambulatory Visit: Admitting: Family Medicine

## 2024-01-22 ENCOUNTER — Telehealth: Payer: Self-pay

## 2024-01-22 ENCOUNTER — Ambulatory Visit: Payer: Self-pay | Admitting: Family Medicine

## 2024-01-22 ENCOUNTER — Other Ambulatory Visit: Payer: Self-pay | Admitting: Family Medicine

## 2024-01-22 ENCOUNTER — Ambulatory Visit (INDEPENDENT_AMBULATORY_CARE_PROVIDER_SITE_OTHER)
Admission: RE | Admit: 2024-01-22 | Discharge: 2024-01-22 | Disposition: A | Discharge status: Home / Self Care | Source: Ambulatory Visit | Attending: Family Medicine | Admitting: Family Medicine

## 2024-01-22 ENCOUNTER — Other Ambulatory Visit (HOSPITAL_COMMUNITY): Payer: Self-pay

## 2024-01-22 VITALS — BP 124/80 | HR 66 | Temp 98.3°F | Wt 159.4 lb

## 2024-01-22 DIAGNOSIS — M799 Soft tissue disorder, unspecified: Secondary | ICD-10-CM | POA: Diagnosis not present

## 2024-01-22 DIAGNOSIS — M79671 Pain in right foot: Secondary | ICD-10-CM

## 2024-01-22 DIAGNOSIS — M19071 Primary osteoarthritis, right ankle and foot: Secondary | ICD-10-CM | POA: Diagnosis not present

## 2024-01-22 DIAGNOSIS — M7989 Other specified soft tissue disorders: Secondary | ICD-10-CM | POA: Diagnosis not present

## 2024-01-22 MED ORDER — TRAMADOL HCL 50 MG PO TABS: 100.0000 mg | ORAL_TABLET | Freq: Four times a day (QID) | ORAL | 1 refills | Status: DC | PRN

## 2024-01-22 MED ORDER — COLCHICINE 0.6 MG PO TABS: 0.6000 mg | ORAL_TABLET | Freq: Four times a day (QID) | ORAL | 1 refills | Status: DC | PRN

## 2024-01-22 NOTE — Telephone Encounter (Signed)
 Pharmacy Patient Advocate Encounter   Received notification from CoverMyMeds that prior authorization for traMADol  HCl 50MG  tablets  is required/requested.   Insurance verification completed.   The patient is insured through [facility] .   Per test claim: PA required; PA submitted to above mentioned insurance via CoverMyMeds Key/confirmation #/EOC [id number] Status is pending  HAD TO FAX CHART NOTES SEPARATE FORM PA Case ID #: [id number] FAX TO [phone]

## 2024-01-22 NOTE — Progress Notes (Signed)
   Subjective:    Patient ID: Kim Elliott, female    DOB: [birth year], 58 y.o.   MRN: [id number]  HPI Here for the sudden onset of swelling and severe pain in the lateral right foot. She has been taking Celebrex , icing the foot, and elevating it with no relief. No recent trauma.    Review of Systems  Constitutional: Negative.   Respiratory: Negative.    Cardiovascular: Negative.   Musculoskeletal:  Positive for arthralgias.       Objective:   Physical Exam Constitutional:      Comments: In pain, limping   Cardiovascular:     Rate and Rhythm: Normal rate and regular rhythm.     Pulses: Normal pulses.     Heart sounds: Normal heart sounds.  Pulmonary:     Effort: Pulmonary effort is normal.     Breath sounds: Normal breath sounds.  Musculoskeletal:     Comments: She is very tender over the right 5th MTP joint and along the lateral foot. The area is red and warm and swollen   Neurological:     Mental Status: She is alert.           Assessment & Plan:  Right foot pain, likely due to gout. We will treat with Colchicine  and Tramadol . Get Xrays of the foot today. Garnette Olmsted, MD

## 2024-01-22 NOTE — Addendum Note (Signed)
 Addended by: JOHNNY SENIOR A on: 01/22/2024 05:14 PM   Modules accepted: Orders

## 2024-01-23 ENCOUNTER — Telehealth: Payer: Self-pay

## 2024-01-23 NOTE — Telephone Encounter (Signed)
 Dr Johnny sent Rx with new directions as requested by pt pharmacy Copied from CRM [id number]. Topic: Clinical - Prescription Issue >> Jan 22, 2024  3:36 PM Maisie C wrote: Reason for CRM: elise from [facility] on [address] called In regarding the Tramadol . She stated that its written for 2 every 6 hours which is 80 but they need to add max 5 tabs a day for it to be equivalent.

## 2024-01-24 ENCOUNTER — Other Ambulatory Visit (HOSPITAL_COMMUNITY): Payer: Self-pay

## 2024-01-24 NOTE — Telephone Encounter (Signed)
 Pharmacy Patient Advocate Encounter  Received notification from [name] that Prior Authorization for traMADol  HCl 50MG  tablets  has been APPROVED from 01/24/24 to 07/26/24. Unable to obtain price due to refill too soon rejection, last fill date 01/22/24 next available fill date7/26/25   PA #/Case ID/Reference #: [id number]

## 2024-01-25 NOTE — Telephone Encounter (Signed)
 Pt.notified

## 2024-01-30 ENCOUNTER — Ambulatory Visit: Admitting: Family Medicine

## 2024-01-30 ENCOUNTER — Encounter: Payer: Self-pay | Admitting: Family Medicine

## 2024-01-30 VITALS — BP 110/78 | HR 59 | Temp 98.1°F | Wt 158.8 lb

## 2024-01-30 DIAGNOSIS — M79671 Pain in right foot: Secondary | ICD-10-CM | POA: Diagnosis not present

## 2024-01-30 MED ORDER — PREDNISONE 20 MG PO TABS: 20.0000 mg | ORAL_TABLET | Freq: Two times a day (BID) | ORAL | 0 refills | Status: DC

## 2024-01-30 NOTE — Progress Notes (Signed)
   Subjective:    Patient ID: Kim Elliott, female    DOB: Feb 14, 1966, 58 y.o.   MRN: [id number]  HPI Here for 2 weeks of severe pain in the right lateral foot. No recent trauma. She saw us  on 01-22-24 for this, and we suspected gout. We gave her Colchicine  to take every 6 hours and Tramadol , but these have not helped at all. She has has severe pain making it hard to walk. We got Xrays of the foot which were unremarkable.    Review of Systems  Constitutional: Negative.   Respiratory: Negative.    Cardiovascular: Negative.   Musculoskeletal:  Positive for arthralgias.       Objective:   Physical Exam Constitutional:      Comments: In pain, limping   Cardiovascular:     Rate and Rhythm: Normal rate and regular rhythm.     Pulses: Normal pulses.     Heart sounds: Normal heart sounds.  Pulmonary:     Effort: Pulmonary effort is normal.     Breath sounds: Normal breath sounds.  Musculoskeletal:     Comments: Lateral right foot is swollen, warm, and very tender. No erythema   Neurological:     Mental Status: She is alert.           Assessment & Plan:  Foot pain. The 2 most likely etiologies are gout or a stress fracture. She will stop the Colchicine  and she will instead try Prednisone  20 mg BID. She may add Tramadol  and Tylenol  to this. Check CBC, ESR, and uric acid. Refer urgently to Podiatry.  Garnette Olmsted, MD

## 2024-01-31 ENCOUNTER — Ambulatory Visit: Admitting: Podiatry

## 2024-01-31 ENCOUNTER — Ambulatory Visit: Payer: Self-pay | Admitting: Family Medicine

## 2024-01-31 ENCOUNTER — Ambulatory Visit

## 2024-01-31 DIAGNOSIS — M10471 Other secondary gout, right ankle and foot: Secondary | ICD-10-CM

## 2024-01-31 DIAGNOSIS — M7671 Peroneal tendinitis, right leg: Secondary | ICD-10-CM | POA: Diagnosis not present

## 2024-01-31 LAB — CBC WITH DIFFERENTIAL/PLATELET
Basophils Absolute: 0.1 K/uL (ref 0.0–0.1)
Basophils Relative: 1 % (ref 0.0–3.0)
Eosinophils Absolute: 0 K/uL (ref 0.0–0.7)
Eosinophils Relative: 0 % (ref 0.0–5.0)
HCT: 41.7 % (ref 36.0–46.0)
Hemoglobin: 14 g/dL (ref 12.0–15.0)
Lymphocytes Relative: 46.3 % — ABNORMAL HIGH (ref 12.0–46.0)
Lymphs Abs: 2.3 K/uL (ref 0.7–4.0)
MCHC: 33.4 g/dL (ref 30.0–36.0)
MCV: 93.2 fl (ref 78.0–100.0)
Monocytes Absolute: 0.4 K/uL (ref 0.1–1.0)
Monocytes Relative: 7.9 % (ref 3.0–12.0)
Neutro Abs: 2.3 K/uL (ref 1.4–7.7)
Neutrophils Relative %: 44.8 % (ref 43.0–77.0)
Platelets: 233 K/uL (ref 150.0–400.0)
RBC: 4.48 Mil/uL (ref 3.87–5.11)
RDW: 13.6 % (ref 11.5–15.5)
WBC: 5 K/uL (ref 4.0–10.5)

## 2024-01-31 LAB — SEDIMENTATION RATE: Sed Rate: 8 mm/h (ref 0–30)

## 2024-01-31 LAB — URIC ACID: Uric Acid, Serum: 7.1 mg/dL — ABNORMAL HIGH (ref 2.4–7.0)

## 2024-01-31 MED ORDER — INDOMETHACIN 50 MG PO CAPS: 50.0000 mg | ORAL_CAPSULE | Freq: Three times a day (TID) | ORAL | 2 refills | Status: DC

## 2024-01-31 NOTE — Progress Notes (Signed)
  Subjective:  Patient ID: Kim Elliott, female    DOB: [birth year],  MRN: [id number]  Chief Complaint  Patient presents with   Foot Pain    Rm 6 Patient is here for right foot pain. Pain is located on the lateral side of the foot radiating towards the ankle. Pain is described as an  throbbing pain with an intensity of an  8 on a scale of  1-10.     59 y.o. female presents with the above complaint. History confirmed with patient.  Recently diagnosed with lupus by her PCP, was placed on colchicine  empirically to treat possibility of gout continue to worsen colchicine  was not effective had lab work done which resulted yesterday which showed a uric acid of 7.1.  No known trauma or injury.  Works as Pensions consultant with [employer]  Objective:  Physical Exam: warm, good capillary refill, no trophic changes or ulcerative lesions, normal DP and PT pulses, normal sensory exam, and pain swelling edema most notably centered around the 4th and 5th metatarsals also radiates up peroneal tendons to the ankle   Radiographs: Multiple views x-ray of the right foot taken on 01/22/2024 from PCP: no fracture, dislocation, swelling or degenerative changes noted and   Assessment:   1. Other secondary acute gout of right foot   2. Peroneal tendinitis, right      Plan:  Patient was evaluated and treated and all questions answered.  I reviewed her previous lab work and radiographs, we discussed the diagnosis of gout which I do think is the primary causative etiology she also have some evidence of peroneal tendinitis.  Difficult to examine with the amount of inflammation she is having.  Lupus contributing as well to inflammation.  So far on prednisone  20 mg twice daily Tylenol  and tramadol .  I recommended adding indomethacin  as well to this 50 mg 3 times daily.  She will not take her Celebrex  during this time period.  Advised to avoid NSAIDs with this.  CAM boot dispensed to allow for safer ambulation and soft  tissue healing if there is peroneal tendinitis or tendinosis happening as well.  Follow-up in 1 month to reevaluate.  Toe guard was applied to cam walker boot as she has to wear closed toed shoes for work, if she is able to wear this we will provide a letter otherwise utilize the boot is much as possible outside of work and regular shoes at work.  Return in about 1 month (around 03/02/2024) for follow up gout, tendinitis.

## 2024-02-04 NOTE — Telephone Encounter (Signed)
 Noted

## 2024-02-04 NOTE — Telephone Encounter (Signed)
 First of all, I have never diagnosed her with lupus. Her ANA is elevated which indicates some sort of autoimmune process is going on, but I cannot say for sure which one. I had referred her to see Rheumatology to sort this out, but she declined an appt since it was not until December. The one certain diagnosis she has is gout. Since her uric acid level was elevated, I plan to start her on Allopurinol every day. This will lower the uric acid level to normal and will help prevent future gout attacks. We cannot start this however until her current gout attack has calmed down. I see she saw the podiatrist for this also.

## 2024-02-04 NOTE — Telephone Encounter (Signed)
 Spoke with pt advised of Dr Johnny recommendation, voiced understanding, pt stated that she will call to schedule a rheumatology appointment soon. Pt had appointment with a podiatrist who stopped the Colchicine  and prescribed indomethacin  (INDOCIN ) 50 MG capsule TID pt states that it seems to help the gout. Pt will call to schedule OV with Dr Johnny to update him further.

## 2024-02-07 ENCOUNTER — Other Ambulatory Visit: Payer: Self-pay | Admitting: Family Medicine

## 2024-02-12 ENCOUNTER — Encounter: Payer: Self-pay | Admitting: Family Medicine

## 2024-02-12 ENCOUNTER — Ambulatory Visit: Admitting: Family Medicine

## 2024-02-12 ENCOUNTER — Other Ambulatory Visit: Payer: Self-pay

## 2024-02-12 VITALS — BP 112/76 | HR 56 | Temp 97.8°F | Wt 156.6 lb

## 2024-02-12 DIAGNOSIS — M109 Gout, unspecified: Secondary | ICD-10-CM | POA: Diagnosis not present

## 2024-02-12 DIAGNOSIS — I872 Venous insufficiency (chronic) (peripheral): Secondary | ICD-10-CM

## 2024-02-12 MED ORDER — GABAPENTIN 100 MG PO CAPS: 100.0000 mg | ORAL_CAPSULE | Freq: Three times a day (TID) | ORAL | 3 refills | Status: DC

## 2024-02-12 NOTE — Progress Notes (Signed)
   Subjective:    Patient ID: Kim Elliott, female    DOB: Jan 31, 1966, 58 y.o.   MRN: [id number]  HPI Here to follow up on right foot pain that began suddenly about 6 weeks ago. We felt this was due to gout so started her on Colchicine . This did not help so we changed to Prednisone . This was more helpful but she still had pain every day. She saw Podiatry on 01-31-24, and they also felt the gout was the culprit. They gave her Indomethacin  to try, but this did not help at all so she went back to the Prednisone . She is taking Prednisone  20 mg BID. She has been working her normal hours.    Review of Systems  Constitutional: Negative.   Respiratory: Negative.    Cardiovascular: Negative.   Musculoskeletal:  Positive for arthralgias.       Objective:   Physical Exam Constitutional:      Appearance: Normal appearance.     Comments: She walks normally   Cardiovascular:     Rate and Rhythm: Normal rate and regular rhythm.     Pulses: Normal pulses.     Heart sounds: Normal heart sounds.  Pulmonary:     Effort: Pulmonary effort is normal.     Breath sounds: Normal breath sounds.  Musculoskeletal:     Comments: The right foot appears much improved today. The warmth and erythema have resolved, and it is less swollen. She is still tender along the lateral foot   Neurological:     Mental Status: She is alert.           Assessment & Plan:  Right foot pain, likely from gout. She will stay on the Prednisone  for the next 2 weeks, and we will add Gabapentin  100 mg TID. Recheck in 2 weeks.  Garnette Olmsted, MD

## 2024-02-16 ENCOUNTER — Encounter (HOSPITAL_COMMUNITY): Payer: Self-pay | Admitting: Pharmacy Technician

## 2024-02-16 ENCOUNTER — Emergency Department (HOSPITAL_COMMUNITY): Admission: EM | Admit: 2024-02-16 | Discharge: 2024-02-16 | Disposition: A | Discharge status: Home / Self Care

## 2024-02-16 ENCOUNTER — Other Ambulatory Visit: Payer: Self-pay

## 2024-02-16 ENCOUNTER — Emergency Department (HOSPITAL_COMMUNITY)

## 2024-02-16 DIAGNOSIS — M25511 Pain in right shoulder: Secondary | ICD-10-CM | POA: Insufficient documentation

## 2024-02-16 DIAGNOSIS — I10 Essential (primary) hypertension: Secondary | ICD-10-CM | POA: Insufficient documentation

## 2024-02-16 DIAGNOSIS — M25411 Effusion, right shoulder: Secondary | ICD-10-CM | POA: Diagnosis not present

## 2024-02-16 DIAGNOSIS — Y990 Civilian activity done for income or pay: Secondary | ICD-10-CM | POA: Diagnosis not present

## 2024-02-16 DIAGNOSIS — W01198A Fall on same level from slipping, tripping and stumbling with subsequent striking against other object, initial encounter: Secondary | ICD-10-CM | POA: Diagnosis not present

## 2024-02-16 DIAGNOSIS — M79601 Pain in right arm: Secondary | ICD-10-CM | POA: Insufficient documentation

## 2024-02-16 DIAGNOSIS — W19XXXA Unspecified fall, initial encounter: Secondary | ICD-10-CM

## 2024-02-16 DIAGNOSIS — J45909 Unspecified asthma, uncomplicated: Secondary | ICD-10-CM | POA: Insufficient documentation

## 2024-02-16 DIAGNOSIS — M79621 Pain in right upper arm: Secondary | ICD-10-CM | POA: Diagnosis not present

## 2024-02-16 MED ORDER — METHOCARBAMOL 500 MG PO TABS
500.0000 mg | ORAL_TABLET | Freq: Once | ORAL | Status: AC
  Administered 2024-02-16: 500 mg via ORAL
  Filled 2024-02-16: qty 1

## 2024-02-16 MED ORDER — METHOCARBAMOL 500 MG PO TABS
500.0000 mg | ORAL_TABLET | Freq: Two times a day (BID) | ORAL | 0 refills | Status: DC
Start: 2024-02-16 — End: 2024-02-22

## 2024-02-16 NOTE — Discharge Instructions (Addendum)
 It was a pleasure taking care of you today.  Based on your history, physical exam, and imaging I feel you are safe for discharge.  Today the x-rays of your shoulder and humerus were negative for acute fracture.  You have been placed in a sling, please wear it for your comfort.  You have also been prescribed an outpatient muscle relaxant medicine.  Please take as instructed to help with pain.  Please continue to use rest, ice, compression, and elevation. Please also get back in contact with your primary care provider regarding your Celebrex  and see if it is okay for you to resume it.  You may also take your outpatient gabapentin  and tramadol  as prescribed.  You have also been given a outpatient referral for orthopedics, please call Monday morning and get an appointment if symptoms persist or worsen.  If experiencing the following symptoms including but not limited to severe pain, swelling, worsening range of motion, fever, chills, chest pain, shortness of breath, rash, or other concerning symptom please return to the emergency department or seek further medical care. Please do not drive or operate heavy machinery after taking the muscle relaxant as it can make you drowsy.

## 2024-02-16 NOTE — Progress Notes (Signed)
 Orthopedic Tech Progress Note Patient Details:  [name] [facility] [birth year] [id number]  Ortho Devices Type of Ortho Device: Shoulder immobilizer Ortho Device/Splint Location: RUE Ortho Device/Splint Interventions: Ordered, Application, Adjustment   Post Interventions Patient Tolerated: Well Instructions Provided: Adjustment of device, Care of device  [name] 02/16/2024, 9:48 AM

## 2024-02-16 NOTE — ED Triage Notes (Signed)
 Pt here POV with R shoulder and arm pain. States her foot got caught in a cart and she fell, hitting her head and R side when she fell. Denies LOC. Decreased ROM to RUE.

## 2024-02-16 NOTE — ED Provider Notes (Signed)
 [place] EMERGENCY DEPARTMENT AT [facility] Provider Note   CSN: [id number] Arrival date & time: 02/16/24  0800     Patient presents with: Arm Pain   Kim Elliott is a 58 y.o. female who presents to the emergency department with a chief complaint of R shoulder and arm pain. Patient states she was at work and had a mechanical fall involving a shopping cart on Wednesday. Denies blood thinners. Patient states that she fell on her right side. Patient states that she may have hit her head, however denies nausea, vomiting, LOC, light sensitivity, visual disturbances, current headache, neck pain at this time.  Patient's past medical history is significant for depression with anxiety, alcohol dependence, hypertension, PTSD, asthma, gout, etc.    Arm Pain       Prior to Admission medications   Medication Sig Start Date End Date Taking? Authorizing Provider  methocarbamol  (ROBAXIN ) 500 MG tablet Take 1 tablet (500 mg total) by mouth 2 (two) times daily. 02/16/24  Yes Alahia Whicker F, PA-C  celecoxib  (CELEBREX ) 200 MG capsule Take 1 capsule (200 mg total) by mouth 2 (two) times daily. 12/10/23   Johnny Garnette LABOR, MD  Chlorpheniramine Maleate (ALLERGY PO) Take by mouth.    [provider]  diazepam  (VALIUM ) 5 MG tablet Take 1 tablet (5 mg total) by mouth every 8 (eight) hours as needed for anxiety. 08/29/23   Johnny Garnette LABOR, MD  EVENING PRIMROSE OIL PO Take by mouth.    [provider]  gabapentin  (NEURONTIN ) 100 MG capsule Take 1 capsule (100 mg total) by mouth 3 (three) times daily. 02/12/24   Johnny Garnette LABOR, MD  metoprolol  succinate (TOPROL -XL) 100 MG 24 hr tablet Take 1 tablet (100 mg total) by mouth in the morning and at bedtime. Take with or immediately following a meal. 10/10/23   Johnny Garnette LABOR, MD  Multiple Vitamin (MULTIVITAMIN) tablet Take 4 tablets by mouth daily.    [provider]  omeprazole  (PRILOSEC) 40 MG capsule Take 1 capsule (40 mg total)  by mouth daily. 10/10/23   Johnny Garnette LABOR, MD  predniSONE  (DELTASONE ) 20 MG tablet Take 1 tablet (20 mg total) by mouth 2 (two) times daily with a meal. 01/30/24   Johnny Garnette LABOR, MD  traMADol  (ULTRAM ) 50 MG tablet Take 2 tablets (100 mg total) by mouth every 6 (six) hours as needed. 02/07/24   Johnny Garnette LABOR, MD    Allergies: Codeine, Oxycodone , and Vicodin [hydrocodone-acetaminophen ]    Review of Systems  Musculoskeletal:        R shoulder and arm pain     Updated Vital Signs BP (!) 158/102 (BP Location: Left Arm)   Pulse 61   Temp 97.7 F (36.5 C) (Oral)   Resp 16   SpO2 95%   Physical Exam Vitals and nursing note reviewed.  Constitutional:      General: She is awake. She is not in acute distress.    Appearance: Normal appearance. She is not ill-appearing, toxic-appearing or diaphoretic.  HENT:     Head: Normocephalic and atraumatic.     Comments: No battles sign, no raccoon eyes, scalp and face nontender to palpation, no appreciable hematomas, lacerations, or bruising Eyes:     General: No scleral icterus. Neck:     Comments: Patient able to look left, right, up, and touch chin to chest as appropriate, C-spine nontender to palpation Pulmonary:     Effort: Pulmonary effort is normal. No respiratory distress.  Musculoskeletal:     Right shoulder: Tenderness and bony tenderness present. No deformity. Decreased range of motion. Decreased strength.     Cervical back: Normal range of motion.     Right lower leg: No edema.     Left lower leg: No edema.     Comments: R shoulder and humerus extensively tender to palpation, mild swelling present, ROM of R shoulder limited with limited external rotation and flexion both passively and actively due to pain  Flexion and extension of the elbow intact with discomfort both passively and actively   Normal movement of digits of R hand and flexion/extension of the wrist   RUE neurovascularly intact  Skin:    General: Skin is warm.      Capillary Refill: Capillary refill takes less than 2 seconds.     Comments: No appreciated bruising or color changes of the RUE   Neurological:     General: No focal deficit present.     Mental Status: She is alert and oriented to person, place, and time.  Psychiatric:        Mood and Affect: Mood normal.        Behavior: Behavior normal. Behavior is cooperative.     (all labs ordered are listed, but only abnormal results are displayed) Labs Reviewed - No data to display  EKG: None  Radiology: DG Humerus Right Result Date: 02/16/2024 CLINICAL DATA:  Pain after fall EXAM: RIGHT HUMERUS - 2+ VIEW; RIGHT SHOULDER - 2+ VIEW COMPARISON:  None Available. FINDINGS: Right shoulder: Frontal and transscapular views are obtained on 2 images. No fracture, subluxation, or dislocation. Joint spaces are well preserved. Soft tissues are unremarkable. Visualized portions of the right chest are clear. Right humerus: Frontal and lateral views are obtained. No acute fracture. Alignment of the shoulder and elbow is anatomic. Soft tissues are unremarkable. IMPRESSION: 1. Unremarkable right shoulder and right humerus. No acute fracture. Electronically Signed   By: Ozell Daring M.D.   On: 02/16/2024 08:55   DG Shoulder Right Result Date: 02/16/2024 CLINICAL DATA:  Pain after fall EXAM: RIGHT HUMERUS - 2+ VIEW; RIGHT SHOULDER - 2+ VIEW COMPARISON:  None Available. FINDINGS: Right shoulder: Frontal and transscapular views are obtained on 2 images. No fracture, subluxation, or dislocation. Joint spaces are well preserved. Soft tissues are unremarkable. Visualized portions of the right chest are clear. Right humerus: Frontal and lateral views are obtained. No acute fracture. Alignment of the shoulder and elbow is anatomic. Soft tissues are unremarkable. IMPRESSION: 1. Unremarkable right shoulder and right humerus. No acute fracture. Electronically Signed   By: Ozell Daring M.D.   On: 02/16/2024 08:55      Procedures   Medications Ordered in the ED  methocarbamol  (ROBAXIN ) tablet 500 mg (500 mg Oral Given 02/16/24 0951)                                    Medical Decision Making Amount and/or Complexity of Data Reviewed Radiology: ordered.  Risk Prescription drug management.   Patient presents to the ED for concern of Right shoulder pain, fall, this involves an extensive number of treatment options, and is a complaint that carries with it a high risk of complications and morbidity.  The differential diagnosis includes fracture, rotator cuff injury, soft tissue injury, skull fracture, brain bleed, cervical spine injury, bruising, etc.   Co morbidities that complicate the patient evaluation  depression with  anxiety, alcohol dependence, hypertension, PTSD, asthma, gout   Imaging Studies ordered:  I ordered imaging studies including x-rays of the right humerus and right shoulder I independently visualized and interpreted imaging which showed no evidence of fracture or acute injury I agree with the radiologist interpretation   Medicines ordered and prescription drug management:  I ordered medication including Robaxin  for pain Reevaluation of the patient after these medicines showed that the patient improved I have reviewed the patients home medicines and have made adjustments as needed   Test Considered:  CT head, CT cervical spine: Declined at this time as fall was on Wednesday, patient denies blood thinners, patient denies nausea, vomiting, visual disturbances, headache, neck pain, or other concerning head symptoms.  Patient has been ambulatory since without issue, patient has even returned to work.  Very low clinical suspicion based on mechanism, history, as well as physical exam for acute life-threatening head injury or neck injury.   Critical Interventions:  None   Problem List / ED Course:  57 yof, mechanical fall, R shoulder and R arm pain, reduced ROM, no  blood thinners  Vital signs stable  On physical exam right shoulder and right humerus diffusely tender to palpation, no evidence of significant swelling, color changes, low clinical suspicion for something like a DVT Imaging today negative for acute fracture or injury Patient given splint as well as prescription for Robaxin  to help with pain.  Patient currently taking Celebrex , Tylenol , Gabapentin , tramadol  for outpatient pain relief already.  Patient given instructions to make an orthopedic appointment as soon as possible for ongoing diagnosis and treatment Return precautions given Patient discharged Most likely diagnosis at this time is right shoulder/arm sprain versus rotator cuff tear or rotator cuff pathology due to traumatic mechanical fall Patient discharged with sling and recommended RICE therapy as well as orthopedic follow-up   Reevaluation:  After the interventions noted above, I reevaluated the patient and found that they have :improved   Social Determinants of Health:  none   Dispostion:  After consideration of the diagnostic results and the patients response to treatment, I feel that the patent would benefit from discharge and outpatient therapy as prescribed.  Orthopedic follow-up..       Final diagnoses:  Right arm pain  Fall, initial encounter    ED Discharge Orders          Ordered    methocarbamol  (ROBAXIN ) 500 MG tablet  2 times daily        02/16/24 1012               Erendida Wrenn F, PA-C 02/16/24 1635    Gennaro Bouchard L, DO 02/18/24 2132

## 2024-02-18 ENCOUNTER — Telehealth: Payer: Self-pay

## 2024-02-18 NOTE — Transitions of Care (Post Inpatient/ED Visit) (Signed)
   02/18/2024  Name: Kim Elliott MRN: [id number] DOB: 21-Oct-1965  Today's TOC FU Call Status: Today's TOC FU Call Status:: Successful TOC FU Call Completed TOC FU Call Complete Date: 02/18/24 Patient's Name and Date of Birth confirmed.  Transition Care Management Follow-up Telephone Call Date of Discharge: 02/16/24 Discharge Facility: [facility]) Type of Discharge: Emergency Department How have you been since you were released from the hospital?: Same Any questions or concerns?: No  Items Reviewed:    Medications Reviewed Today: Medications Reviewed Today   Medications were not reviewed in this encounter     Home Care and Equipment/Supplies:    Functional Questionnaire:    Follow up appointments reviewed: PCP Follow-up appointment confirmed?: NA Specialist Hospital Follow-up appointment confirmed?: Yes Date of Specialist follow-up appointment?: 02/19/24 Follow-Up Specialty Provider:: Dr. Kendal, Ortho    SIGNATURE: Trammell Bowden, CMA

## 2024-02-19 ENCOUNTER — Encounter: Payer: Self-pay | Admitting: Family Medicine

## 2024-02-20 NOTE — Telephone Encounter (Signed)
 FYI

## 2024-02-22 ENCOUNTER — Inpatient Hospital Stay: Admitting: Family Medicine

## 2024-02-22 ENCOUNTER — Ambulatory Visit: Admitting: Family Medicine

## 2024-02-22 ENCOUNTER — Encounter: Payer: Self-pay | Admitting: Family Medicine

## 2024-02-22 VITALS — BP 142/80 | HR 96 | Temp 97.7°F | Wt 154.4 lb

## 2024-02-22 DIAGNOSIS — M25511 Pain in right shoulder: Secondary | ICD-10-CM | POA: Diagnosis not present

## 2024-02-22 DIAGNOSIS — S43401D Unspecified sprain of right shoulder joint, subsequent encounter: Secondary | ICD-10-CM | POA: Diagnosis not present

## 2024-02-22 NOTE — Progress Notes (Signed)
   Subjective:    Patient ID: Lauretha CHRISTELLA Ruby, female    DOB: 01-29-1966, 58 y.o.   MRN: [id number]  HPI Here with her son to follow up on injuries to her right shoulder and arm. On 02-16-24 while at work she was pushing a cart loaded with equipment when her foot slipped, causing her to fall. She landed on her right side and she hit her head on the floor. No LOC, she has full memory of the fall. She immediately had pain in the right shoulder and upper arm and she had a laceration to the right forehead. She went to the ED and the laceration was closed with a dressing. No imaging of her head or neck was done, but Xrays of the right shoulder and humerus were normal. Her arm was placed in a sling, and she was given Methocarbamol  to take along with the Tramadol  she had at home. She went to work the next couple of days but she couldn't really do anything. She decided to claim Worker's Comp, and she was sent to [facility] urgent care on 02-19-24. They gave her Tizanidine and said she could go back to work, but they said she cannot drive or operate equipment, among other things. She actually has not been back to work since that day because she is in so much pain. She is icing the shoulder and arm and is wearing the sling.    Review of Systems  Constitutional: Negative.   Respiratory: Negative.    Cardiovascular: Negative.   Musculoskeletal:  Positive for arthralgias.  Neurological: Negative.        Objective:   Physical Exam Constitutional:      Comments: Wearing a sling on the right arm   Cardiovascular:     Rate and Rhythm: Normal rate and regular rhythm.     Pulses: Normal pulses.     Heart sounds: Normal heart sounds.  Pulmonary:     Effort: Pulmonary effort is normal.     Breath sounds: Normal breath sounds.  Musculoskeletal:     Comments: The entire right shoulder is very tender and she has almost no ROM of the shoulder due to pain. She guards heavily against any movements   Neurological:      Mental Status: She is alert.           Assessment & Plan:  Right shoulder sprain. 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 She wishes for us  to take over the care of this injury. We will urgently refer her to Orthopedics to evaluate. She will wear the sling and take Celebrex  and Tramadol  as needed. We will write her out of work from 02-19-24 until 03-04-24. We spent a total of ( 33  ) minutes reviewing records and discussing these issues.  Garnette Olmsted, MD

## 2024-02-26 DIAGNOSIS — M25511 Pain in right shoulder: Secondary | ICD-10-CM | POA: Diagnosis not present

## 2024-02-28 ENCOUNTER — Ambulatory Visit: Admitting: Podiatry

## 2024-02-29 DIAGNOSIS — S42254A Nondisplaced fracture of greater tuberosity of right humerus, initial encounter for closed fracture: Secondary | ICD-10-CM | POA: Diagnosis not present

## 2024-03-04 ENCOUNTER — Other Ambulatory Visit: Payer: Self-pay | Admitting: Family Medicine

## 2024-03-05 ENCOUNTER — Encounter: Payer: Self-pay | Admitting: Family Medicine

## 2024-03-05 ENCOUNTER — Ambulatory Visit: Payer: Self-pay

## 2024-03-05 NOTE — Telephone Encounter (Signed)
 Attempted to call pt to see if she would like appointment available on Monday.

## 2024-03-05 NOTE — Telephone Encounter (Signed)
 FYI Only or Action Required?: FYI only for provider.  Patient was last seen in primary care on 02/22/2024 by Johnny Garnette LABOR, MD.  Called Nurse Triage reporting Head Injury.  Symptoms began several weeks ago.  Interventions attempted: Nothing.  Symptoms are: gradually worsening.  Triage Disposition: See PCP When Office is Open (Within 3 Days)  Patient/caregiver understands and will follow disposition?: No, refuses disposition     Copied from CRM #[id number]. Topic: Clinical - Red Word Triage >> Mar 05, 2024  8:11 AM Carlyon D wrote: Red Word that prompted transfer to Nurse Triage: pt stated she fell few weeks ago hit her head has severe dizziness, head pain, not sleeping, blur vision when she gets dizzy. Body chills now one min. She's hot next sweating Reason for Disposition  [1] After 3 days AND [2] headache persists  Answer Assessment - Initial Assessment Questions 1. MECHANISM: How did the injury happen? For falls, ask: What height did you fall from? and What surface did you fall against?      [place]lem[place] a few weeks ago and hit her head, now has dizziness, head pain, not sleeping, blur vision  when dizzy, body chills 2. ONSET: When did the injury happen? (e.g., minutes, hours ago)      Few weeks ago, states she tripped at work and flipped forward. 3. NEUROLOGIC SYMPTOMS: Was there any loss of consciousness? Are there any other neurological symptoms?      Was seen in the ER,  unknown LOC 4. MENTAL STATUS: Does the person know who they are, who you are, and where they are?      yes 5. LOCATION: What part of the head was hit?      Right side 6. SCALP APPEARANCE: What does the scalp look like? Is it bleeding now? If Yes, ask: Is it difficult to stop?      denies 7. SIZE: For cuts, bruises, or swelling, ask: How large is it? (e.g., inches or centimeters)      Denies closed now 8. PAIN: Is there any pain? If Yes, ask: How bad is it? (Scale 0-10; or none, mild,  moderate, severe)     moderate 9. TETANUS: For any breaks in the skin, ask: When was your last tetanus booster?     yes 10. BLOOD THINNERS: Do you take any blood thinners? (e.g., aspirin, clopidogrel / Plavix, coumadin, heparin). Notes: Other strong blood thinners include: Arixtra (fondaparinux), Eliquis (apixaban), Pradaxa (dabigatran), and Xarelto (rivaroxaban).       no 11. OTHER SYMPTOMS: Do you have any other symptoms? (e.g., neck pain, vomiting)       no 12. PREGNANCY: Is there any chance you are pregnant? When was your last menstrual period?       na  Protocols used: Head Injury-A-AH

## 2024-03-05 NOTE — Telephone Encounter (Signed)
 Please work her in to see me tomorrow at 10:00

## 2024-03-06 ENCOUNTER — Encounter: Payer: Self-pay | Admitting: Family Medicine

## 2024-03-06 ENCOUNTER — Ambulatory Visit
Admission: RE | Admit: 2024-03-06 | Discharge: 2024-03-06 | Disposition: A | Discharge status: Home / Self Care | Source: Ambulatory Visit | Attending: Family Medicine | Admitting: Family Medicine

## 2024-03-06 ENCOUNTER — Ambulatory Visit: Admitting: Family Medicine

## 2024-03-06 VITALS — BP 110/80 | HR 67 | Temp 98.2°F | Wt 153.2 lb

## 2024-03-06 DIAGNOSIS — S0011XD Contusion of right eyelid and periocular area, subsequent encounter: Secondary | ICD-10-CM | POA: Diagnosis not present

## 2024-03-06 DIAGNOSIS — S161XXD Strain of muscle, fascia and tendon at neck level, subsequent encounter: Secondary | ICD-10-CM

## 2024-03-06 DIAGNOSIS — S199XXA Unspecified injury of neck, initial encounter: Secondary | ICD-10-CM | POA: Diagnosis not present

## 2024-03-06 DIAGNOSIS — S060X1D Concussion with loss of consciousness of 30 minutes or less, subsequent encounter: Secondary | ICD-10-CM

## 2024-03-06 DIAGNOSIS — S0990XA Unspecified injury of head, initial encounter: Secondary | ICD-10-CM | POA: Diagnosis not present

## 2024-03-06 DIAGNOSIS — S060X1A Concussion with loss of consciousness of 30 minutes or less, initial encounter: Secondary | ICD-10-CM | POA: Insufficient documentation

## 2024-03-06 NOTE — Progress Notes (Addendum)
   Subjective:    Patient ID: Kim Elliott, female    DOB: 07/17/1965, 58 y.o.   MRN: [id number]  HPI Here with her son for 2 weeks of intermittent dizziness (where she feels like the room is spinning), mild headaches, and episodes of feeling chilled. No nausea or blurred vision or light sensitivity, She has also had stiffness and pain in the right side of the neck. She fell over a shopping cart and landed on her right shoulder with her right forehead striking the ground. She thinks she loss consciousness for a few seconds.  She was seen in the ED that day, and most of the attention was paid to her shoulder injury. No imaging of the head or neck was obtained.                                                 Review of Systems  Constitutional:  Positive for chills and fatigue. Negative for diaphoresis and fever.  HENT: Negative.    Eyes: Negative.   Respiratory: Negative.    Cardiovascular: Negative.   Gastrointestinal: Negative.   Genitourinary: Negative.   Musculoskeletal:  Positive for neck pain and neck stiffness.  Neurological:  Positive for dizziness and headaches. Negative for tremors, seizures, syncope, speech difficulty and weakness.       Objective:   Physical Exam Constitutional:      General: She is not in acute distress.    Comments: Wearing an arm sling on the right side   HENT:     Head:     Comments: Mildly tender above the right orbit Eyes:     Extraocular Movements: Extraocular movements intact.     Conjunctiva/sclera: Conjunctivae normal.     Pupils: Pupils are equal, round, and reactive to light.  Neck:     Comments: She is tender along the right posterior neck. ROM is limited by pain Neurological:     General: No focal deficit present.     Mental Status: She is alert and oriented to person, place, and time.           Assessment & Plan:  She has had a concussion and a head contusion, and I explained that the headaches and dizziness are frequent  concussion symptoms. She will rest and avoid driving for several weeks. These symptoms should resolve over the next few weeks. We will set up a head CT to rule out intracranial bleeds, etc. She also has a neck strain. She can apply heat and do gentle stretches. We will set up a CT of the cervical spine to rule out fractures, etc.  Garnette Olmsted, MD

## 2024-03-06 NOTE — Telephone Encounter (Signed)
 Noted

## 2024-03-07 ENCOUNTER — Ambulatory Visit: Payer: Self-pay | Admitting: Family Medicine

## 2024-03-13 ENCOUNTER — Other Ambulatory Visit: Payer: Self-pay | Admitting: Family Medicine

## 2024-03-17 ENCOUNTER — Ambulatory Visit (HOSPITAL_COMMUNITY)
Admission: RE | Admit: 2024-03-17 | Discharge: 2024-03-17 | Disposition: A | Discharge status: Home / Self Care | Source: Ambulatory Visit | Attending: Surgery | Admitting: Surgery

## 2024-03-17 ENCOUNTER — Ambulatory Visit: Admitting: Physician Assistant

## 2024-03-17 VITALS — BP 128/74 | HR 65 | Temp 97.7°F | Wt 158.5 lb

## 2024-03-17 DIAGNOSIS — I872 Venous insufficiency (chronic) (peripheral): Secondary | ICD-10-CM | POA: Insufficient documentation

## 2024-03-17 DIAGNOSIS — I83811 Varicose veins of right lower extremities with pain: Secondary | ICD-10-CM | POA: Diagnosis not present

## 2024-03-17 DIAGNOSIS — M7989 Other specified soft tissue disorders: Secondary | ICD-10-CM

## 2024-03-17 DIAGNOSIS — I83899 Varicose veins of unspecified lower extremities with other complications: Secondary | ICD-10-CM | POA: Diagnosis not present

## 2024-03-20 NOTE — Progress Notes (Unsigned)
 Requested by:  Johnny Garnette LABOR, MD [address] [place],  [place] [address]  Reason for consultation: venous insufficiency    History of Present Illness   Kim Elliott is a 58 y.o. (December 27, 1965) female who presents for evaluation of venous insufficiency.  The patient says she has been previously diagnosed with venous insufficiency decades ago.  She reports undergoing at least 5 total previous vein procedures combined between the right and left legs.  She cannot remember exactly what she has had done, but this has involved possible ligation and stripping of superficial veins in her thighs.  Her last procedure was performed in 2002.  She remembers that her previous procedures were performed for painful varicose veins with lower extremity swelling and associated discomfort.  At today's visit she says that one large varicose vein popped up randomly on her right leg about 4-5 months ago. Ever since this vein popped up she has had worsening swelling in her right ankle and lower leg. She also says that the vein gets more dilated throughout the day and causes her throbbing and aching pain. She also has some spider veins on her left calf that she would like treated because she doesn't like the way they look.  She currently wears knee high compression stockings and elevates her legs intermittently, which helps her symptoms moderately.  Past Medical History:  Diagnosis Date   Abnormal Pap smear of cervix    Adenomyosis    Allergy    Anemia    completed iron   Anxiety    Cancer (HCC)    melanoma   Closed fracture of left clavicle 05/05/2014   Depression    Dysmenorrhea    Fibroids    GERD (gastroesophageal reflux disease)    possibly per pt   Hyperlipidemia    Hypertension    IUD (intrauterine device) in place    mirena  IUD placed 2022 for menorrhagia. 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)    Migraine  without aura     Past Surgical History:  Procedure Laterality Date   BREAST SURGERY  1995   lt br [id number]   COLPOSCOPY     DILATION AND CURETTAGE OF UTERUS     x2post misscarage   MELANOMA EXCISION  2016   left lower leg   ORIF CLAVICULAR FRACTURE Left 05/05/2014   Procedure: OPEN REDUCTION INTERNAL FIXATION (ORIF) LEFT  CLAVICULAR FRACTURE;  Surgeon: Fonda SHAUNNA Olmsted, MD;  Location: [facility];  Service: Orthopedics;  Laterality: Left;   VEIN LIGATION AND STRIPPING     legs    Social History   Socioeconomic History   Marital status: Divorced    Spouse name: Not on file   Number of children: Not on file   Years of education: Not on file   Highest education level: Bachelor's degree (e.g., BA, AB, BS)  Occupational History   Not on file  Tobacco Use   Smoking status: Former    Current packs/day: 0.00    Types: Cigarettes    Quit date: 04/30/1984    Years since quitting: 39.9   Smokeless tobacco: Never  Vaping Use   Vaping status: Never Used  Substance and Sexual Activity   Alcohol use: Yes    Comment: 1 glasses wine per day   Drug use: No   Sexual activity: Not Currently    Partners: Male  Birth control/protection: I.U.D.    Comment: mirena  IUD insertion on 06/22/21.  Other Topics Concern   Not on file  Social History Narrative   Not on file   Social Drivers of Health   Financial Resource Strain: Low Risk  (01/21/2024)   Overall Financial Resource Strain (CARDIA)    Difficulty of Paying Living Expenses: Not hard at all  Food Insecurity: No Food Insecurity (01/21/2024)   Hunger Vital Sign    Worried About Running Out of Food in the Last Year: Never true    Ran Out of Food in the Last Year: Never true  Transportation Needs: No Transportation Needs (01/21/2024)   PRAPARE - Administrator, Civil Service (Medical): No    Lack of Transportation (Non-Medical): No  Physical Activity: Sufficiently Active (01/21/2024)   Exercise Vital Sign     Days of Exercise per Week: 5 days    Minutes of Exercise per Session: 150+ min  Stress: Stress Concern Present (01/21/2024)   [employer] of Occupational Health - Occupational Stress Questionnaire    Feeling of Stress: Very much  Social Connections: Moderately Isolated (01/21/2024)   Social Connection and Isolation Panel    Frequency of Communication with Friends and Family: Once a week    Frequency of Social Gatherings with Friends and Family: Once a week    Attends Religious Services: 1 to 4 times per year    Active Member of [employer] or Organizations: Yes    Attends Banker Meetings: 1 to 4 times per year    Marital Status: Divorced  Catering manager Violence: Not on file    Family History  Problem Relation Age of Onset   Hypertension Father    Crohn's disease Other    Colon cancer Neg Hx    Colon polyps Neg Hx    Esophageal cancer Neg Hx    Stomach cancer Neg Hx    Rectal cancer Neg Hx    Pancreatic cancer Neg Hx     Current Outpatient Medications  Medication Sig Dispense Refill   celecoxib  (CELEBREX ) 200 MG capsule Take 1 capsule by mouth twice daily 180 capsule 3   Chlorpheniramine Maleate (ALLERGY PO) Take by mouth.     diazepam  (VALIUM ) 5 MG tablet Take 1 tablet (5 mg total) by mouth every 8 (eight) hours as needed for anxiety. 90 tablet 5   EVENING PRIMROSE OIL PO Take by mouth.     gabapentin  (NEURONTIN ) 100 MG capsule Take 1 capsule (100 mg total) by mouth 3 (three) times daily. 90 capsule 3   metoprolol  succinate (TOPROL -XL) 100 MG 24 hr tablet TAKE 1 TABLET BY MOUTH IN THE MORNING AND 1 TABLET AT BEDTIME. TAKE  WITH  OR  IMMEDIATELY  FOLLOWING  A  MEAL. 60 tablet 0   Multiple Vitamin (MULTIVITAMIN) tablet Take 4 tablets by mouth daily.     omeprazole  (PRILOSEC) 40 MG capsule Take 1 capsule (40 mg total) by mouth daily. 30 capsule 3   traMADol  (ULTRAM ) 50 MG tablet Take 2 tablets (100 mg total) by mouth every 6 (six) hours as needed. 60 tablet 5   No  current facility-administered medications for this visit.    Allergies  Allergen Reactions   Codeine Nausea And Vomiting   Oxycodone  Nausea And Vomiting    hallucinations   Vicodin [Hydrocodone-Acetaminophen ] Nausea And Vomiting    hallucinations    REVIEW OF SYSTEMS (negative unless checked):   Cardiac:  []  Chest pain or chest pressure? []   Shortness of breath upon activity? []  Shortness of breath when lying flat? []  Irregular heart rhythm?  Vascular:  []  Pain in calf, thigh, or hip brought on by walking? []  Pain in feet at night that wakes you up from your sleep? []  Blood clot in your veins? [x]  Leg swelling?  Pulmonary:  []  Oxygen at home? []  Productive cough? []  Wheezing?  Neurologic:  []  Sudden weakness in arms or legs? []  Sudden numbness in arms or legs? []  Sudden onset of difficult speaking or slurred speech? []  Temporary loss of vision in one eye? []  Problems with dizziness?  Gastrointestinal:  []  Blood in stool? []  Vomited blood?  Genitourinary:  []  Burning when urinating? []  Blood in urine?  Psychiatric:  []  Major depression  Hematologic:  []  Bleeding problems? []  Problems with blood clotting?  Dermatologic:  []  Rashes or ulcers?  Constitutional:  []  Fever or chills?  Ear/Nose/Throat:  []  Change in hearing? []  Nose bleeds? []  Sore throat?  Musculoskeletal:  []  Back pain? []  Joint pain? []  Muscle pain?   Physical Examination     Vitals:   03/17/24 1233  BP: 128/74  Pulse: 65  Temp: 97.7 F (36.5 C)  TempSrc: Temporal  Weight: 158 lb 8 oz (71.9 kg)   Body mass index is 27.64 kg/m.  General:  WDWN in NAD; vital signs documented above Gait: Not observed HENT: WNL, normocephalic Pulmonary: normal non-labored breathing  Cardiac: regular Abdomen: soft, NT, no masses Skin: without rashes Vascular Exam/Pulses: palpable right DP pulse Extremities: RLE with several small reticular veins, moderate nonpitting ankle edema, and  varicose AAGSV from the mid thigh to the mid shin Musculoskeletal: no muscle wasting or atrophy  Neurologic: A&O X 3;  No focal weakness or paresthesias are detected Psychiatric:  The pt has Normal affect.         Non-invasive Vascular Imaging   RLE Venous Insufficiency Duplex (03/17/2024):  +--------------+---------+------+-----------+------------+--------+  RIGHT        Reflux NoRefluxReflux TimeDiameter cmsComments                          Yes                                   +--------------+---------+------+-----------+------------+--------+  CFV                    yes   >1 second                       +--------------+---------+------+-----------+------------+--------+  FV mid        no                                              +--------------+---------+------+-----------+------------+--------+  Popliteal    no                                              +--------------+---------+------+-----------+------------+--------+  GSV at SFJ              yes    >500 ms      1.02              +--------------+---------+------+-----------+------------+--------+  GSV prox thighno                            0.49              +--------------+---------+------+-----------+------------+--------+  GSV mid thigh no                            0.43              +--------------+---------+------+-----------+------------+--------+  GSV dist thighno                            0.51              +--------------+---------+------+-----------+------------+--------+  GSV at knee             yes    >500 ms      0.44              +--------------+---------+------+-----------+------------+--------+  GSV prox calf no                            0.37              +--------------+---------+------+-----------+------------+--------+  GSV mid calf  no                            0.37               +--------------+---------+------+-----------+------------+--------+  SSV at [facility]    no                            0.46              +--------------+---------+------+-----------+------------+--------+  SSV prox calf           yes    >500 ms      0.38              +--------------+---------+------+-----------+------------+--------+  SSV mid calf  no                            0.28              +--------------+---------+------+-----------+------------+--------+  AAGSV Prox              yes    >500 ms      0.5               +--------------+---------+------+-----------+------------+--------+  AAGSV Mid               yes    >500 ms      0.51              +--------------+---------+------+-----------+------------+--------+    Medical Decision Making   SHEERA ILLINGWORTH is a 58 y.o. female who presents for evaluation of venous insufficiency  Based on the patient's duplex, there is reflux in the right common femoral vein and greater saphenous vein at the saphenofemoral junction and knee.  There is also reflux in the small saphenous vein at the proximal calf.  There is also reflux in the anterior accessory greater saphenous vein in the thigh.  She could potentially be a candidate for ablation of her anterior accessory greater saphenous vein. The patient reports a long history of  venous insufficiency with 5 prior vein procedures on her legs, including ligation/stripping She reports over the past 5 months worsening swelling in her right lower leg and ankle and a new varicose vein that is getting larger and more painful.  She says that the varicose vein dilates throughout the day and causes her daily aching/throbbing pain.  She does currently wear compression stockings and elevates her legs intermittently, which helps moderately with her swelling.  This does not help much with the pain in her varicose vein. On exam she has a palpable right DP pulse.  She has moderate nonpitting  edema of the right ankle.  She has no venous ulcerations.  She has a long varicose vein on her right leg that runs from the mid thigh to the mid shin, which is likely her insufficient anterior accessory greater saphenous vein.  She also has 2 clusters of spider veins on her left lateral calf that she would like treated with sclerotherapy due to their appearance I have explained to the patient that she could be a good candidate for treatment of her anterior accessory greater saphenous vein, including ablation and stab phlebectomy.  She is very interested in getting this procedure done.  I have encouraged her to elevate her legs above her heart daily, avoid prolonged sitting and standing, exercise, and wear 20 to 30 mmHg thigh-high compression stockings daily. She will continue conservative therapy and follow-up with one of our Mds in 3 to 4 months to discuss further treatment on the right leg. Kim Elliott will also call the patient to arrange sclerotherapy on the left leg   Kim Garside SHAUNNA Holster, PA-C [facility] Office: [phone] 03/17/2024  Clinic MD: Kim Elliott

## 2024-03-24 ENCOUNTER — Encounter: Payer: Self-pay | Admitting: Family Medicine

## 2024-03-24 ENCOUNTER — Telehealth: Payer: Self-pay

## 2024-03-24 ENCOUNTER — Ambulatory Visit: Admitting: Family Medicine

## 2024-03-24 VITALS — BP 128/80 | HR 58 | Temp 98.1°F | Wt 163.4 lb

## 2024-03-24 DIAGNOSIS — F418 Other specified anxiety disorders: Secondary | ICD-10-CM | POA: Diagnosis not present

## 2024-03-24 MED ORDER — ALPRAZOLAM 1 MG PO TABS: 1.0000 mg | ORAL_TABLET | Freq: Three times a day (TID) | ORAL | 0 refills | Status: DC | PRN

## 2024-03-24 NOTE — Progress Notes (Signed)
   Subjective:    Patient ID: Kim Elliott, female    DOB: [birth year], 58 y.o.   MRN: [id number]  HPI Here to follow up on her anxiety and depression. In addition to her recent injuries, she is still struggling with family issues. One of her daughters has not spoken to her for 2 years, and now that she has her own baby she is not allowing Kim Elliott to see it. She has been taking Diazepam  with only partial relief. She has increased the dose from 5 mg to 10 mg, but she asks to try something else. She has trouble sleeping and her appetite is poor. She has decided that she would not benefit from talking to a therapist, as she did in the past, and she is writing a journal instead. She finds this to be therapeutic.    Review of Systems  Constitutional: Negative.   Respiratory: Negative.    Cardiovascular: Negative.   Psychiatric/Behavioral:  Positive for dysphoric mood and sleep disturbance. Negative for agitation, behavioral problems, confusion and hallucinations. The patient is nervous/anxious.        Objective:   Physical Exam Constitutional:      Appearance: Normal appearance.     Comments: Her right arm is in a sling   Cardiovascular:     Rate and Rhythm: Normal rate and regular rhythm.     Pulses: Normal pulses.     Heart sounds: Normal heart sounds.  Pulmonary:     Effort: Pulmonary effort is normal.     Breath sounds: Normal breath sounds.  Neurological:     Mental Status: She is alert.  Psychiatric:        Behavior: Behavior normal.        Thought Content: Thought content normal.     Comments: She is tearful for most of our visit            Assessment & Plan:  Depression with anxiety. She will stop Diazepam  and instead she will try Xanax  1 mg as needed. Report back in 2 weeks.  Garnette Olmsted, MD

## 2024-03-24 NOTE — Telephone Encounter (Signed)
 Spoke to pt about sclerotherapy. She is interested in getting RLE assessed for reflux after possibly treating LLE. Her RLE has 2 clusters of spider veins but she feels she may be a candidate for laser vein ablation. She is scheduled with MD for 3 month f/u.

## 2024-03-24 NOTE — Telephone Encounter (Signed)
 Attempted to reach pt to schedule sclerotherapy. Her VM is full-unable to leave VM.

## 2024-03-27 NOTE — Progress Notes (Signed)
 Office Visit Note  Patient: Kim Elliott             Date of Birth: Mar 22, 1966           MRN: [id number]             PCP: Johnny Garnette LABOR, MD Referring: Johnny Garnette LABOR, MD Visit Date: 03/31/2024 Occupation: Data Unavailable  Subjective:  Pain in multiple joints  History of Present Illness: Kim Elliott is a 58 y.o. female seen for the evaluation of polyarthralgia and positive ANA.  According the patient her symptoms started in March 2025 after COVID-19 virus infection.  She states that she started having joint pain and discomfort while she had COVID-19 virus infection and then the pain persists even after the COVID-19 virus infection resolved.  She was seen by Dr. Johnny and had labs in June which was positive for ANA.  For that reason she was referred to me.  She states she has been having pain and discomfort in her neck, wrist, hands, knees, ankles and her feet.  She notices swelling in her hands ankles and her feet.She gives history of fatigue, arthralgias, myalgias, hair loss, photosensitivity.  There is no history of oral ulcers, sicca symptoms, malar rash, Raynaud's phenomenon or lymphadenopathy. She is right-handed.  She is currently on medical leave as she fell in August 2013 at work.  She states that she hit her head and her right arm on the concrete.  She has been under care of Dr. Josefina.  She had several x-rays followed by MRI.  She was given a sling for possible fracture.  She has further studies pending.  She states for the last 2 days she has been having some diarrhea which she relates to starting on a new medication.  She is single she is gravida 5, para 3, miscarriages 2.  There is no history of preeclampsia or DVTs.  She drinks 1 glass of wine per night.  She does not smoke.  She has a son with Crohn's disease.  There is family history of multiple myeloma in her father.    Activities of Daily Living:  Patient reports morning stiffness for all day.  Patient Reports nocturnal  pain.  Difficulty dressing/grooming: Denies Difficulty climbing stairs: Reports Difficulty getting out of chair: Denies Difficulty using hands for taps, buttons, cutlery, and/or writing: Reports  Review of Systems  Constitutional:  Positive for fatigue.  HENT:  Negative for mouth sores and mouth dryness.   Eyes:  Negative for dryness.  Respiratory:  Positive for shortness of breath.        Asthma  Cardiovascular:  Positive for palpitations. Negative for chest pain.       Anxiety  Gastrointestinal:  Positive for diarrhea. Negative for blood in stool and constipation.  Endocrine: Positive for increased urination.  Genitourinary:  Positive for involuntary urination.  Musculoskeletal:  Positive for joint pain, gait problem, joint pain, joint swelling, myalgias, muscle weakness, morning stiffness, muscle tenderness and myalgias.  Skin:  Positive for hair loss and sensitivity to sunlight. Negative for color change and rash.  Allergic/Immunologic: Negative for susceptible to infections.  Neurological:  Positive for dizziness, numbness and headaches.  Hematological:  Negative for swollen glands.  Psychiatric/Behavioral:  Positive for depressed mood and sleep disturbance. The patient is nervous/anxious.     PMFS History:  Patient Active Problem List   Diagnosis Date Noted   Concussion wth loss of consciousness of 30 minutes or less 03/06/2024   Gout  02/12/2024   PTSD (post-traumatic stress disorder) 04/25/2022   Asthma 04/25/2022   Personal history of other specified conditions 03/08/2021   Rosacea 03/08/2021   Seborrheic keratosis 03/08/2021   Hx of melanoma in situ 08/26/2020   HTN (hypertension) 08/07/2019   Alcohol dependence (HCC) 04/21/2018   Seasonal allergic rhinitis 04/21/2018   MDD (major depressive disorder), severe (HCC) 04/19/2018   Depression with anxiety 07/20/2017    Past Medical History:  Diagnosis Date   Abnormal Pap smear of cervix    Adenomyosis    Allergy     Anemia    completed iron   Anxiety    Cancer (HCC)    melanoma   Closed fracture of left clavicle 05/05/2014   Depression    Dysmenorrhea    Fibroids    GERD (gastroesophageal reflux disease)    possibly per pt   Hyperlipidemia    Hypertension    IUD (intrauterine device) in place    mirena  IUD placed 2022 for menorrhagia. 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)    Migraine without aura     Family History  Problem Relation Age of Onset   Hypertension Father    Healthy Brother    Arthritis Maternal Grandmother    Crohn's disease Son    Arthritis Son    Colon cancer Neg Hx    Colon polyps Neg Hx    Esophageal cancer Neg Hx    Stomach cancer Neg Hx    Rectal cancer Neg Hx    Pancreatic cancer Neg Hx    Past Surgical History:  Procedure Laterality Date   BREAST SURGERY  1995   lt br bx-neg   COLPOSCOPY     DILATION AND CURETTAGE OF UTERUS     x2post misscarage   MELANOMA EXCISION  2016   left lower leg   ORIF CLAVICULAR FRACTURE Left 05/05/2014   Procedure: OPEN REDUCTION INTERNAL FIXATION (ORIF) LEFT  CLAVICULAR FRACTURE;  Surgeon: Fonda SHAUNNA Olmsted, MD;  Location: [facility];  Service: Orthopedics;  Laterality: Left;   VEIN LIGATION AND STRIPPING     legs   Social History   Tobacco Use   Smoking status: Former    Current packs/day: 0.00    Types: Cigarettes    Quit date: 04/30/1984    Years since quitting: 39.9    Passive exposure: Past   Smokeless tobacco: Never  Vaping Use   Vaping status: Never Used  Substance Use Topics   Alcohol use: Yes    Comment: 1 glasses wine per day   Drug use: No   Social History   Social History Narrative   Not on file     Immunization History  Administered Date(s) Administered   Influenza,inj,Quad PF,6+ Mos 06/06/2018, 04/02/2019, 07/06/2020   PFIZER Comirnaty(Gray Top)Covid-19 Tri-Sucrose Vaccine 12/30/2020   PFIZER(Purple  Top)SARS-COV-2 Vaccination 09/21/2019, 10/12/2019   Tdap 03/15/2016     Objective: Vital Signs: BP (!) 141/83 (BP Location: Left Arm, Patient Position: Sitting, Cuff Size: Normal)   Pulse 67   Temp 97.6 F (36.4 C)   Resp 15   Ht 5' 4 (1.626 m)   Wt 158 lb 6.4 oz (71.8 kg)   BMI 27.19 kg/m    Physical Exam Vitals and nursing note reviewed.  Constitutional:      Appearance: She is well-developed.  HENT:     Head: Normocephalic and atraumatic.  Eyes:  Conjunctiva/sclera: Conjunctivae normal.  Cardiovascular:     Rate and Rhythm: Normal rate and regular rhythm.     Heart sounds: Normal heart sounds.  Pulmonary:     Effort: Pulmonary effort is normal.     Breath sounds: Normal breath sounds.  Abdominal:     General: Bowel sounds are normal.     Palpations: Abdomen is soft.  Musculoskeletal:     Cervical back: Normal range of motion.  Lymphadenopathy:     Cervical: No cervical adenopathy.  Skin:    General: Skin is warm and dry.     Capillary Refill: Capillary refill takes less than 2 seconds.  Neurological:     Mental Status: She is alert and oriented to person, place, and time.  Psychiatric:        Behavior: Behavior normal.      Musculoskeletal Exam: She had limited lateral rotation of the cervical spine.  Patient states she has discomfort in her neck since she had fallen.  Thoracic and lumbar spine were in good range of motion.  There was no SI joint tenderness.  Right arm could not be examined.  Left shoulder joint, elbow joint, wrist joints, MCPs, PIPs and DIPs were in good range of motion with no synovitis.  Hip joints and knee joints were in good range of motion without any warmth swelling or effusion.  There was no tenderness over ankles or MTPs.  Bilateral first MTP thickening was noted.  PIP and DIP thickening with hammertoes were noted.   CDAI Exam: CDAI Score: -- Patient Global: --; Provider Global: -- Swollen: --; Tender: -- Joint Exam 03/31/2024    No joint exam has been documented for this visit   There is currently no information documented on the homunculus. Go to the Rheumatology activity and complete the homunculus joint exam.  Investigation: No additional findings.  Imaging: VAS US  LOWER EXTREMITY VENOUS REFLUX Result Date: 03/17/2024  Lower Venous Reflux Study Patient Name:  TAVIE HASEMAN  Date of Exam:   03/17/2024 Medical Rec #: [id number]        Accession #:    [id number] Date of Birth: May 27, 1966        Patient Gender: F Patient Age:   73 years Exam Location:  [address] Procedure:      VAS US  LOWER EXTREMITY VENOUS REFLUX Referring Phys: GAILE NEW --------------------------------------------------------------------------------  Indications: Varicosities, and venous insufficiency.  Risk Factors: Past pregnancy. Performing Technologist: Garnette Rockers  Examination Guidelines: A complete evaluation includes B-mode imaging, spectral Doppler, color Doppler, and power Doppler as needed of all accessible portions of each vessel. Bilateral testing is considered an integral part of a complete examination. Limited examinations for reoccurring indications may be performed as noted. The reflux portion of the exam is performed with the patient in reverse Trendelenburg. Significant venous reflux is defined as >500 ms in the superficial venous system, and >1 second in the deep venous system.  Venous Reflux Times +--------------+---------+------+-----------+------------+--------+ RIGHT         Reflux NoRefluxReflux TimeDiameter cmsComments                         Yes                                  +--------------+---------+------+-----------+------------+--------+ CFV  yes   >1 second                      +--------------+---------+------+-----------+------------+--------+ FV mid        no                                              +--------------+---------+------+-----------+------------+--------+ Popliteal     no                                             +--------------+---------+------+-----------+------------+--------+ GSV at SFJ              yes    >500 ms      1.02             +--------------+---------+------+-----------+------------+--------+ GSV prox thighno                            0.49             +--------------+---------+------+-----------+------------+--------+ GSV mid thigh no                            0.43             +--------------+---------+------+-----------+------------+--------+ GSV dist thighno                            0.51             +--------------+---------+------+-----------+------------+--------+ GSV at knee             yes    >500 ms      0.44             +--------------+---------+------+-----------+------------+--------+ GSV prox calf no                            0.37             +--------------+---------+------+-----------+------------+--------+ GSV mid calf  no                            0.37             +--------------+---------+------+-----------+------------+--------+ SSV at [facility]    no                            0.46             +--------------+---------+------+-----------+------------+--------+ SSV prox calf           yes    >500 ms      0.38             +--------------+---------+------+-----------+------------+--------+ SSV mid calf  no                            0.28             +--------------+---------+------+-----------+------------+--------+ AAGSV Prox              yes    >500 ms      0.5              +--------------+---------+------+-----------+------------+--------+  AAGSV Mid               yes    >500 ms      0.51             +--------------+---------+------+-----------+------------+--------+   Summary: Right: - No evidence of deep vein thrombosis seen in the right lower extremity, from the common  femoral through the popliteal veins. - No evidence of superficial venous thrombosis in the right lower extremity. - Venous reflux is noted in the right common femoral vein. - Venous reflux is noted in the right sapheno-femoral junction. - Venous reflux is noted in the right greater saphenous vein in the thigh. - Venous reflux is noted in the right short saphenous vein. - Additional reflux noted in the AAGSV Prox-Mid thigh.  *See table(s) above for measurements and observations. Electronically signed by Gaile New MD on 03/17/2024 at 1:33:58 PM.    Final    CT CERVICAL SPINE WO CONTRAST Result Date: 03/06/2024 CLINICAL DATA:  Neck trauma, impaired ROM (Age 27-64y); Head trauma, abnormal mental status (Age 3-64y) fall EXAM: CT HEAD WITHOUT CONTRAST CT CERVICAL SPINE WITHOUT CONTRAST TECHNIQUE: Multidetector CT imaging of the head and cervical spine was performed following the standard protocol without intravenous contrast. Multiplanar CT image reconstructions of the cervical spine were also generated. RADIATION DOSE REDUCTION: This exam was performed according to the departmental dose-optimization program which includes automated exposure control, adjustment of the mA and/or kV according to patient size and/or use of iterative reconstruction technique. COMPARISON:  None Available. FINDINGS: CT HEAD FINDINGS Brain: No evidence of large-territorial acute infarction. No parenchymal hemorrhage. No mass lesion. No extra-axial collection. No mass effect or midline shift. No hydrocephalus. Basilar cisterns are patent. Vascular: No hyperdense vessel. Skull: No acute fracture or focal lesion. Sinuses/Orbits: Paranasal sinuses and mastoid air cells are clear. The orbits are unremarkable. Other: None. CT CERVICAL SPINE FINDINGS Alignment: Normal. Skull base and vertebrae: Posterior osteophyte formation at the C5-C6 level. No associated severe osseous neural foraminal or central canal stenosis. No acute fracture. No  aggressive appearing focal osseous lesion or focal pathologic process. Soft tissues and spinal canal: No prevertebral fluid or swelling. No visible canal hematoma. Upper chest: Unremarkable. Other: Partially visualized plate and screw fixation of the left clavicle. IMPRESSION: 1. No acute intracranial abnormality. 2. No acute displaced fracture or traumatic listhesis of the cervical spine. Electronically Signed   By: Morgane  Naveau M.D.   On: 03/06/2024 17:33   CT HEAD WO CONTRAST ( ) Result Date: 03/06/2024 CLINICAL DATA:  Neck trauma, impaired ROM (Age 27-64y); Head trauma, abnormal mental status (Age 51-64y) fall EXAM: CT HEAD WITHOUT CONTRAST CT CERVICAL SPINE WITHOUT CONTRAST TECHNIQUE: Multidetector CT imaging of the head and cervical spine was performed following the standard protocol without intravenous contrast. Multiplanar CT image reconstructions of the cervical spine were also generated. RADIATION DOSE REDUCTION: This exam was performed according to the departmental dose-optimization program which includes automated exposure control, adjustment of the mA and/or kV according to patient size and/or use of iterative reconstruction technique. COMPARISON:  None Available. FINDINGS: CT HEAD FINDINGS Brain: No evidence of large-territorial acute infarction. No parenchymal hemorrhage. No mass lesion. No extra-axial collection. No mass effect or midline shift. No hydrocephalus. Basilar cisterns are patent. Vascular: No hyperdense vessel. Skull: No acute fracture or focal lesion. Sinuses/Orbits: Paranasal sinuses and mastoid air cells are clear. The orbits are unremarkable. Other: None. CT CERVICAL SPINE FINDINGS Alignment: Normal. Skull base and vertebrae: Posterior osteophyte formation at the  C5-C6 level. No associated severe osseous neural foraminal or central canal stenosis. No acute fracture. No aggressive appearing focal osseous lesion or focal pathologic process. Soft tissues and spinal canal: No  prevertebral fluid or swelling. No visible canal hematoma. Upper chest: Unremarkable. Other: Partially visualized plate and screw fixation of the left clavicle. IMPRESSION: 1. No acute intracranial abnormality. 2. No acute displaced fracture or traumatic listhesis of the cervical spine. Electronically Signed   By: Morgane  Naveau M.D.   On: 03/06/2024 17:33    Recent Labs: Lab Results  Component Value Date   WBC 5.0 01/30/2024   HGB 14.0 01/30/2024   PLT 233.0 01/30/2024   NA 138 12/10/2023   K 3.9 12/10/2023   CL 101 12/10/2023   CO2 27 12/10/2023   GLUCOSE 86 12/10/2023   BUN 14 12/10/2023   CREATININE 0.71 12/10/2023   BILITOT 0.4 10/01/2023   ALKPHOS 90 10/01/2023   AST 61 (H) 10/01/2023   ALT 89 (H) 10/01/2023   PROT 8.1 10/01/2023   ALBUMIN 4.7 10/01/2023   CALCIUM 10.0 12/10/2023   GFRAA >60 04/18/2018   October 04, 2023 EBV IgG positive, IgM negative, CMV IgM negative, December 10, 2023 ANA 1: 320 NH, sed rate 21, CRP<1.0, RF negative January 30, 2024 uric acid 7.1, sed rate 8  Speciality Comments: No specialty comments available.  Procedures:  No procedures performed Allergies: Codeine, Oxycodone , and Vicodin [hydrocodone-acetaminophen ]   Assessment / Plan:     Visit Diagnoses: Polyarthralgia -patient complains of pain and discomfort in multiple joints since she had COVID-19 virus infection in March 2025.  She states the pain never stopped.  She states the pain has been getting worse.  She notices intermittent swelling in her joints.  No synovitis was noted on the examination.  Rheumatoid factor was negative.  Plan: Cyclic citrul peptide antibody, IgG  Positive ANA (antinuclear antibody) -she was found to have positive ANA in July on the lab work.  She gives history of fatigue, arthralgias, myalgias, hair loss.  There is no history of oral ulcers, nasal ulcers, malar rash, Raynaud's phenomenon or lymphadenopathy.  Plan: ANA, Anti-scleroderma antibody, RNP Antibody, Sjogrens  syndrome-A extractable nuclear antibody, Anti-Smith antibody, Sjogrens syndrome-B extractable nuclear antibody, Anti-DNA antibody, double-stranded, C3 and C4, Beta-2 glycoprotein antibodies, Cardiolipin antibodies, IgG, IgM, IgA, Lupus Anticoagulant Eval w/Reflex, Protein / creatinine ratio, urine  History of miscarriage-  2  Elevated LFTs -could be related to alcohol use.  I will recheck LFTs today.  Plan: Hepatic function panel  Right arm pain-patient states she has been having right arm pain since the fall in August.  She has been under care ofDr. Josefina.  She has self of Dr. Josefina.  She has had several x-rays and MRI.  She has been using a sling.  She will follow-up with Dr. Emmaline.  Primary osteoarthritis of both feet-she had bilateral PIP and DIP thickening and first MTP thickening.  Hammertoes were noted.  Other fatigue -she complains of ongoing fatigue.  Plan: Serum protein electrophoresis with reflex, CK, TSH  Idiopathic chronic gout of multiple sites without tophus -patient states that the diagnosis of gout was not confirmed however she had an episode of pain in her first MTP.  She states she was given medication for gout which did not help her.  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  She is closely followed by dermatology.  Primary hypertension-blood pressure was elevated at 143/92.  Repeat blood pressure was 141/83.  She was advised to monitor pressure closely and follow-up with her PCP.  Other medical problems are listed as follows:  Moderate asthma with acute exacerbation, unspecified whether persistent  Venous insufficiency (chronic) (peripheral)  Seasonal allergic rhinitis due to pollen  Depression with anxiety  Rosacea  Seborrheic keratosis  COVID-19 virus infection - 09/2023  History of recent fall-patient states that she fell in August 2025.  She hit her head and right arm against the concrete floor.  Alcohol dependence with other alcohol-induced  disorder (HCC) - one glass of wine a day.  Family history of multiple myeloma-Father  Orders: Orders Placed This Encounter  Procedures   ANA   Anti-scleroderma antibody   RNP Antibody   Sjogrens syndrome-A extractable nuclear antibody   Anti-Smith antibody   Sjogrens syndrome-B extractable nuclear antibody   Anti-DNA antibody, double-stranded   C3 and C4   Beta-2 glycoprotein antibodies   Cardiolipin antibodies, IgG, IgM, IgA   Lupus Anticoagulant Eval w/Reflex   Serum protein electrophoresis with reflex   Cyclic citrul peptide antibody, IgG   CK   TSH   Hepatic function panel   Protein / creatinine ratio, urine   Uric acid   No orders of the defined types were placed in this encounter.   Follow-Up Instructions: Return for Polyarthralgia, positive ANA.   Maya Nash, MD  Note - This record has been created using Animal nutritionist.  Chart creation errors have been sought, but may not always  have been located. Such creation errors do not reflect on  the standard of medical care.

## 2024-03-31 ENCOUNTER — Encounter: Payer: Self-pay | Admitting: Rheumatology

## 2024-03-31 ENCOUNTER — Ambulatory Visit: Attending: Rheumatology | Admitting: Rheumatology

## 2024-03-31 VITALS — BP 141/83 | HR 67 | Temp 97.6°F | Resp 15 | Ht 64.0 in | Wt 158.4 lb

## 2024-03-31 DIAGNOSIS — M19071 Primary osteoarthritis, right ankle and foot: Secondary | ICD-10-CM | POA: Diagnosis not present

## 2024-03-31 DIAGNOSIS — M2550 Pain in unspecified joint: Secondary | ICD-10-CM

## 2024-03-31 DIAGNOSIS — I10 Essential (primary) hypertension: Secondary | ICD-10-CM

## 2024-03-31 DIAGNOSIS — J45901 Unspecified asthma with (acute) exacerbation: Secondary | ICD-10-CM

## 2024-03-31 DIAGNOSIS — M1A09X0 Idiopathic chronic gout, multiple sites, without tophus (tophi): Secondary | ICD-10-CM

## 2024-03-31 DIAGNOSIS — R7989 Other specified abnormal findings of blood chemistry: Secondary | ICD-10-CM

## 2024-03-31 DIAGNOSIS — I872 Venous insufficiency (chronic) (peripheral): Secondary | ICD-10-CM

## 2024-03-31 DIAGNOSIS — L718 Other rosacea: Secondary | ICD-10-CM | POA: Diagnosis not present

## 2024-03-31 DIAGNOSIS — R768 Other specified abnormal immunological findings in serum: Secondary | ICD-10-CM | POA: Diagnosis not present

## 2024-03-31 DIAGNOSIS — Z807 Family history of other malignant neoplasms of lymphoid, hematopoietic and related tissues: Secondary | ICD-10-CM

## 2024-03-31 DIAGNOSIS — Z8759 Personal history of other complications of pregnancy, childbirth and the puerperium: Secondary | ICD-10-CM

## 2024-03-31 DIAGNOSIS — R5383 Other fatigue: Secondary | ICD-10-CM | POA: Diagnosis not present

## 2024-03-31 DIAGNOSIS — L821 Other seborrheic keratosis: Secondary | ICD-10-CM | POA: Diagnosis not present

## 2024-03-31 DIAGNOSIS — L719 Rosacea, unspecified: Secondary | ICD-10-CM

## 2024-03-31 DIAGNOSIS — F10288 Alcohol dependence with other alcohol-induced disorder: Secondary | ICD-10-CM

## 2024-03-31 DIAGNOSIS — Z86006 Personal history of melanoma in-situ: Secondary | ICD-10-CM

## 2024-03-31 DIAGNOSIS — L814 Other melanin hyperpigmentation: Secondary | ICD-10-CM | POA: Diagnosis not present

## 2024-03-31 DIAGNOSIS — M79601 Pain in right arm: Secondary | ICD-10-CM

## 2024-03-31 DIAGNOSIS — U071 COVID-19: Secondary | ICD-10-CM

## 2024-03-31 DIAGNOSIS — J301 Allergic rhinitis due to pollen: Secondary | ICD-10-CM

## 2024-03-31 DIAGNOSIS — M19072 Primary osteoarthritis, left ankle and foot: Secondary | ICD-10-CM

## 2024-03-31 DIAGNOSIS — Z9181 History of falling: Secondary | ICD-10-CM

## 2024-03-31 DIAGNOSIS — F418 Other specified anxiety disorders: Secondary | ICD-10-CM

## 2024-04-01 DIAGNOSIS — R768 Other specified abnormal immunological findings in serum: Secondary | ICD-10-CM | POA: Diagnosis not present

## 2024-04-01 DIAGNOSIS — R5383 Other fatigue: Secondary | ICD-10-CM | POA: Diagnosis not present

## 2024-04-01 DIAGNOSIS — R7989 Other specified abnormal findings of blood chemistry: Secondary | ICD-10-CM | POA: Diagnosis not present

## 2024-04-01 DIAGNOSIS — M2550 Pain in unspecified joint: Secondary | ICD-10-CM | POA: Diagnosis not present

## 2024-04-02 LAB — RNP ANTIBODY: Ribonucleic Protein(ENA) Antibody, IgG: <1 AI

## 2024-04-02 LAB — PROTEIN ELECTROPHORESIS, SERUM, WITH REFLEX
Albumin ELP: 5.4 g/dL — ABNORMAL HIGH (ref 3.8–4.8)
Alpha 1: 0.3 g/dL (ref 0.2–0.3)
Alpha 2: 0.7 g/dL (ref 0.5–0.9)
Beta 2: 0.5 g/dL (ref 0.2–0.5)
Beta Globulin: 0.5 g/dL (ref 0.4–0.6)
Gamma Globulin: 1.1 g/dL (ref 0.8–1.7)
Total Protein: 8.5 g/dL — ABNORMAL HIGH (ref 6.1–8.1)

## 2024-04-02 LAB — HEPATIC FUNCTION PANEL
AG Ratio: 1.6 (calc) (ref 1.0–2.5)
ALT: 41 U/L — ABNORMAL HIGH (ref 6–29)
AST: 37 U/L — ABNORMAL HIGH (ref 10–35)
Albumin: 5.1 g/dL (ref 3.6–5.1)
Alkaline phosphatase (APISO): 73 U/L (ref 37–153)
Bilirubin, Direct: 0.1 mg/dL (ref 0.0–0.2)
Globulin: 3.2 g/dL (ref 1.9–3.7)
Indirect Bilirubin: 0.5 mg/dL (ref 0.2–1.2)
Total Bilirubin: 0.6 mg/dL (ref 0.2–1.2)
Total Protein: 8.3 g/dL — ABNORMAL HIGH (ref 6.1–8.1)

## 2024-04-02 LAB — ANTI-DNA ANTIBODY, DOUBLE-STRANDED: ds DNA Ab: 1 [IU]/mL

## 2024-04-02 LAB — SJOGRENS SYNDROME-A EXTRACTABLE NUCLEAR ANTIBODY: SSA (Ro) (ENA) Antibody, IgG: <1 AI

## 2024-04-02 LAB — URIC ACID: Uric Acid, Serum: 4.6 mg/dL (ref 2.5–7.0)

## 2024-04-02 LAB — TSH: TSH: 1.43 m[IU]/L (ref 0.40–4.50)

## 2024-04-02 LAB — CK: Total CK: 49 U/L (ref 21–240)

## 2024-04-02 LAB — ANTI-SMITH ANTIBODY: ENA SM Ab Ser-aCnc: <1 AI

## 2024-04-02 LAB — ANTI-NUCLEAR AB-TITER (ANA TITER): ANA Titer 1: 1:320 {titer} — ABNORMAL HIGH

## 2024-04-02 LAB — PROTEIN / CREATININE RATIO, URINE
Creatinine, Urine: 88 mg/dL (ref 20–275)
Protein/Creat Ratio: 68 mg/g{creat} (ref 24–184)
Protein/Creatinine Ratio: 0.068 mg/mg{creat} (ref 0.024–0.184)
Total Protein, Urine: 6 mg/dL (ref 5–24)

## 2024-04-02 LAB — C3 AND C4
C3 Complement: 137 mg/dL (ref 83–193)
C4 Complement: 17 mg/dL (ref 15–57)

## 2024-04-02 LAB — ANA: Anti Nuclear Antibody (ANA): POSITIVE — AB

## 2024-04-02 LAB — ANTI-SCLERODERMA ANTIBODY: Scleroderma (Scl-70) (ENA) Antibody, IgG: <1 AI

## 2024-04-02 LAB — CYCLIC CITRUL PEPTIDE ANTIBODY, IGG: Cyclic Citrullin Peptide Ab: <16 U

## 2024-04-02 LAB — SJOGRENS SYNDROME-B EXTRACTABLE NUCLEAR ANTIBODY: SSB (La) (ENA) Antibody, IgG: <1 AI

## 2024-04-03 ENCOUNTER — Encounter: Payer: Self-pay | Admitting: Rheumatology

## 2024-04-03 NOTE — Telephone Encounter (Signed)
 None of the lab results which are available are alarming to be discussed urgently.  We will wait until all results are available.  SPEP which is a screening test for multiple myeloma is normal.

## 2024-04-05 LAB — CARDIOLIPIN ANTIBODIES, IGG, IGM, IGA
Anticardiolipin IgA: <2 [APL'U]/mL (ref <20.0)
Anticardiolipin IgG: <2 [GPL'U]/mL (ref <20.0)
Anticardiolipin IgM: <2 [MPL'U]/mL (ref <20.0)

## 2024-04-05 LAB — LUPUS ANTICOAGULANT EVAL W/ REFLEX
PTT-LA Screen: 32 s (ref <=40)
dRVVT: 30 s (ref <=45)

## 2024-04-05 LAB — BETA-2 GLYCOPROTEIN ANTIBODIES
Beta-2 Glyco 1 IgA: <2 U/mL (ref <20.0)
Beta-2 Glyco 1 IgM: <2 U/mL (ref <20.0)
Beta-2 Glyco I IgG: <2 U/mL (ref <20.0)

## 2024-04-06 ENCOUNTER — Ambulatory Visit: Payer: Self-pay | Admitting: Rheumatology

## 2024-04-06 NOTE — Progress Notes (Signed)
 ANA is positive and stable titer, RNP negative, SCL 70 negative, SSA negative, SSB negative, Smith negative, dsDNA negative, anticardiolipin antibody negative, beta-2 GP 1 negative, lupus anticoagulant negative, complements normal, SPEP normal, liver functions better but is still elevated, anti-CCP negative, CK normal, TSH normal, urine protein creatinine ratio normal, uric acid normal.  I will discuss results at the follow-up visit.

## 2024-04-08 NOTE — Progress Notes (Signed)
 Office Visit Note  Patient: Kim Elliott             Date of Birth: 1965-08-07           MRN: [id number]             PCP: Johnny Garnette LABOR, MD Referring: Johnny Garnette LABOR, MD Visit Date: 04/22/2024 Occupation: Data Unavailable  Subjective:  Positive ANA  History of Present Illness: Kim Elliott is a 58 y.o. female with polyarthralgia and positive ANA.  She returns today after her last visit on March 31, 2024.  She states she has been having a lot of discomfort since her fall in her neck and her right shoulder.  She also has some generalized achiness.  She has not noticed any joint swelling.  She has been having headaches coming from her neck.  She is under care of Dr. Baird for Worker's Comp.  She denies any history of oral ulcers, nasal ulcers, sicca symptoms, malar rash, photosensitivity, Raynaud's or lymphadenopathy.    Activities of Daily Living:  Patient reports morning stiffness for 1 hour.   Patient Reports nocturnal pain.  Difficulty dressing/grooming: Denies Difficulty climbing stairs: Reports Difficulty getting out of chair: Denies Difficulty using hands for taps, buttons, cutlery, and/or writing: Reports  Review of Systems  Constitutional:  Positive for fatigue.  HENT:  Negative for mouth sores and mouth dryness.   Eyes:  Negative for dryness.  Respiratory:  Negative for shortness of breath.   Cardiovascular:  Negative for chest pain and palpitations.  Gastrointestinal:  Positive for diarrhea. Negative for blood in stool and constipation.  Endocrine: Negative for increased urination.  Genitourinary:  Negative for involuntary urination.  Musculoskeletal:  Positive for joint pain, joint pain, myalgias, muscle weakness, morning stiffness, muscle tenderness and myalgias. Negative for gait problem and joint swelling.  Skin:  Negative for color change, rash, hair loss and sensitivity to sunlight.  Allergic/Immunologic: Negative for susceptible to infections.   Neurological:  Positive for dizziness and headaches.  Hematological:  Negative for swollen glands.  Psychiatric/Behavioral:  Positive for depressed mood and sleep disturbance. The patient is nervous/anxious.     PMFS History:  Patient Active Problem List   Diagnosis Date Noted   Polyarthralgia 04/17/2024   Concussion wth loss of consciousness of 30 minutes or less 03/06/2024   Gout 02/12/2024   PTSD (post-traumatic stress disorder) 04/25/2022   Asthma 04/25/2022   Personal history of other specified conditions 03/08/2021   Rosacea 03/08/2021   Seborrheic keratosis 03/08/2021   Hx of melanoma in situ 08/26/2020   HTN (hypertension) 08/07/2019   Alcohol dependence (HCC) 04/21/2018   Seasonal allergic rhinitis 04/21/2018   MDD (major depressive disorder), severe (HCC) 04/19/2018   Depression with anxiety 07/20/2017    Past Medical History:  Diagnosis Date   Abnormal Pap smear of cervix    Adenomyosis    Allergy    Anemia    completed iron   Anxiety    Cancer (HCC)    melanoma   Closed fracture of left clavicle 05/05/2014   Depression    Dysmenorrhea    Fibroids    GERD (gastroesophageal reflux disease)    possibly per pt   Hyperlipidemia    Hypertension    IUD (intrauterine device) in place    mirena  IUD placed 2022 for menorrhagia. 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)  Migraine without aura     Family History  Problem Relation Age of Onset   Hypertension Father    Healthy Brother    Arthritis Maternal Grandmother    Crohn's disease Son    Arthritis Son    Colon cancer Neg Hx    Colon polyps Neg Hx    Esophageal cancer Neg Hx    Stomach cancer Neg Hx    Rectal cancer Neg Hx    Pancreatic cancer Neg Hx    Past Surgical History:  Procedure Laterality Date   BREAST SURGERY  1995   lt br bx-neg   COLPOSCOPY     DILATION AND CURETTAGE OF UTERUS     [id number] misscarage   MELANOMA  EXCISION  2016   left lower leg   ORIF CLAVICULAR FRACTURE Left 05/05/2014   Procedure: OPEN REDUCTION INTERNAL FIXATION (ORIF) LEFT  CLAVICULAR FRACTURE;  Surgeon: Fonda SHAUNNA Olmsted, MD;  Location: [facility];  Service: Orthopedics;  Laterality: Left;   VEIN LIGATION AND STRIPPING     legs   Social History   Tobacco Use   Smoking status: Former    Current packs/day: 0.00    Types: Cigarettes    Quit date: 04/30/1984    Years since quitting: 40.0    Passive exposure: Past   Smokeless tobacco: Never  Vaping Use   Vaping status: Never Used  Substance Use Topics   Alcohol use: Yes    Comment: wine occ   Drug use: No   Social History   Social History Narrative   Not on file     Immunization History  Administered Date(s) Administered   Influenza, Seasonal, Injecte, Preservative Fre 04/17/2024   Influenza,inj,Quad PF,6+ Mos 06/06/2018, 04/02/2019, 07/06/2020   PFIZER Comirnaty(Gray Top)Covid-19 Tri-Sucrose Vaccine 12/30/2020   PFIZER(Purple Top)SARS-COV-2 Vaccination 09/21/2019, 10/12/2019   Tdap 03/15/2016     Objective: Vital Signs: BP 124/80   Pulse 66   Temp 98.3 F (36.8 C)   Resp 17   Ht 5' 4 (1.626 m)   Wt 161 lb 9.6 oz (73.3 kg)   BMI 27.74 kg/m    Physical Exam Vitals and nursing note reviewed.  Constitutional:      Appearance: She is well-developed.  HENT:     Head: Normocephalic and atraumatic.  Eyes:     Conjunctiva/sclera: Conjunctivae normal.  Cardiovascular:     Rate and Rhythm: Normal rate and regular rhythm.     Heart sounds: Normal heart sounds.  Pulmonary:     Effort: Pulmonary effort is normal.     Breath sounds: Normal breath sounds.  Abdominal:     General: Bowel sounds are normal.     Palpations: Abdomen is soft.  Musculoskeletal:     Cervical back: Normal range of motion.  Lymphadenopathy:     Cervical: No cervical adenopathy.  Skin:    General: Skin is warm and dry.     Capillary Refill: Capillary refill takes  less than 2 seconds.  Neurological:     Mental Status: She is alert and oriented to person, place, and time.  Psychiatric:        Behavior: Behavior normal.      Musculoskeletal Exam: She had limited lateral rotation with discomfort of the cervical spine especially to the right.  She had no tenderness over thoracic or lumbar spine.  Right arm was in a sling.  Left shoulder joint was in full range of motion.  Left elbow, wrist joints, MCPs PIPs and DIPs  with good range of motion with no synovitis.  Hips and knees were in good range of motion without any warmth swelling or effusion.  There was no tenderness over ankles or MTPs.  CDAI Exam: CDAI Score: -- Patient Global: --; Provider Global: -- Swollen: --; Tender: -- Joint Exam 04/22/2024   No joint exam has been documented for this visit   There is currently no information documented on the homunculus. Go to the Rheumatology activity and complete the homunculus joint exam.  Investigation: No additional findings.  Imaging: No results found.   Recent Labs: Lab Results  Component Value Date   WBC 5.0 01/30/2024   HGB 14.0 01/30/2024   PLT 233.0 01/30/2024   NA 138 12/10/2023   K 3.9 12/10/2023   CL 101 12/10/2023   CO2 27 12/10/2023   GLUCOSE 86 12/10/2023   BUN 14 12/10/2023   CREATININE 0.71 12/10/2023   BILITOT 0.6 03/31/2024   ALKPHOS 90 10/01/2023   AST 37 (H) 03/31/2024   ALT 41 (H) 03/31/2024   PROT 8.5 (H) 03/31/2024   PROT 8.3 (H) 03/31/2024   ALBUMIN 4.7 10/01/2023   CALCIUM 10.0 12/10/2023   GFRAA >60 04/18/2018   March 31, 2024 urine protein creatinine ratio normal, ANA 1: 320 NH, ENA panel (SCL 70, RNP, SSA, SSB, Smith, dsDNA) negative, beta-2 GP 1 negative, anticardiolipin negative, lupus anticoagulant negative, C3-C4 normal, anti-CCP negative, CK 49, TSH normal, uric acid 4.6  Speciality Comments: No specialty comments available.  Procedures:  No procedures performed Allergies: Codeine,  Oxycodone , and Vicodin [hydrocodone-acetaminophen ]   Assessment / Plan:     Visit Diagnoses: Polyarthralgia - Pain in multiple joints since COVID-19 virus infection March 2025.  She has been experiencing some discomfort in her joints.  She has not noticed any joint swelling.  No synovitis was noted.  Rheumatoid factor and anti-CCP were negative.  Uric acid was within normal limits.  Positive ANA (antinuclear antibody) -labs obtained on March 31, 2024 ANA 1: 320 NH, ENA panel (SCL 70, RNP, SSA, SSB, Smith, double-stranded ENA) negative, complements normal, urine protein creatinine ratio normal, anticardiolipin negative, beta-2 GP 1 negative, lupus anticoagulant negative.  Labs results were reviewed with the patient.  She has no clinical features of lupus or related disease.  I advised her to contact me if she develops any new symptoms.  Elevated LFTs - Improving, still elevated.  Patient was taking NSAIDs and also drinks alcohol occasionally.  I advised her to follow-up with the PCP.  History of miscarriage  Right arm pain - Since the fall in August 2025 patient states she has a fracture and has been followed by of Dr. Baird now under [employer].  Primary osteoarthritis of both feet-proper fitting shoes were advised.  Other fatigue  Idiopathic chronic gout of multiple sites without tophus-there is questionable history of gout.  Uric acid is normal.  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  She is followed by dermatology.  Primary hypertension-blood pressure was normal today.  Rosacea  Moderate asthma with acute exacerbation, unspecified whether persistent  Venous insufficiency (chronic) (peripheral)  Seasonal allergic rhinitis due to pollen  Depression with anxiety  Seborrheic keratosis  Alcohol dependence with other alcohol-induced disorder (HCC) - 1 glass of wine per night  Family history of multiple myeloma-Father  History of recent fall - August  2025.  Orders: No orders of the defined types were placed in this encounter.  No orders of the defined types were placed  in this encounter.    Follow-Up Instructions: Return for +ANA.   Maya Nash, MD  Note - This record has been created using Animal nutritionist.  Chart creation errors have been sought, but may not always  have been located. Such creation errors do not reflect on  the standard of medical care.

## 2024-04-17 ENCOUNTER — Ambulatory Visit: Admitting: Family Medicine

## 2024-04-17 ENCOUNTER — Encounter: Payer: Self-pay | Admitting: Family Medicine

## 2024-04-17 VITALS — BP 110/78 | HR 74 | Temp 98.2°F | Wt 159.4 lb

## 2024-04-17 DIAGNOSIS — M2550 Pain in unspecified joint: Secondary | ICD-10-CM

## 2024-04-17 DIAGNOSIS — Z23 Encounter for immunization: Secondary | ICD-10-CM | POA: Diagnosis not present

## 2024-04-17 DIAGNOSIS — F418 Other specified anxiety disorders: Secondary | ICD-10-CM

## 2024-04-17 MED ORDER — ALPRAZOLAM 1 MG PO TABS: 1.0000 mg | ORAL_TABLET | Freq: Three times a day (TID) | ORAL | 2 refills | Status: DC | PRN

## 2024-04-17 MED ORDER — FLUOXETINE HCL 20 MG PO CAPS: 20.0000 mg | ORAL_CAPSULE | Freq: Every day | ORAL | 2 refills | Status: DC

## 2024-04-17 NOTE — Progress Notes (Signed)
   Subjective:    Patient ID: Kim Elliott, female    DOB: 1966/01/18, 58 y.o.   MRN: [id number]  HPI Here to follow up on depression and anxiety, as well as polyarthralgia. At our last visit we switched her from Diazepam  and Alprazolam , and this has been helpful for the anxiety. However she still has depressive symptoms like sadness and tearfulness. She recently saw Dr. Dolphus for a rheumatology workup. Her SPEP was negative, so this ruled out the possibility of multiple myeloma. She has more tests pending however, and Janine will follow up with her on 04-22-24. She is seeing Dr. Baird for her recent fall injuries.    Review of Systems  Constitutional: Negative.   Respiratory: Negative.    Cardiovascular: Negative.   Musculoskeletal:  Positive for arthralgias.  Psychiatric/Behavioral:  Positive for dysphoric mood. Negative for agitation, behavioral problems and confusion. The patient is nervous/anxious.        Objective:   Physical Exam Constitutional:      General: She is not in acute distress.    Comments: Her right arm is in a sling   Cardiovascular:     Rate and Rhythm: Normal rate and regular rhythm.     Pulses: Normal pulses.     Heart sounds: Normal heart sounds.  Pulmonary:     Effort: Pulmonary effort is normal.  Neurological:     Mental Status: She is alert.  Psychiatric:        Mood and Affect: Mood normal.        Behavior: Behavior normal.        Thought Content: Thought content normal.           Assessment & Plan:  She has polyarthralgia of some sort, and she will follow up with Dr. Dolphus. In the meantime she uses Tylenol  and Ibuprofen  as needed. For the depression and anxiety, we agreed that she needs treatment for the depression in addition to the Xanax . We will add Prozac  20 mg daily. Recheck in 3 weeks.  Garnette Olmsted, MD

## 2024-04-17 NOTE — Addendum Note (Signed)
 Addended by: LADONNA INOCENTE SAILOR on: 04/17/2024 10:26 AM   Modules accepted: Orders

## 2024-04-22 ENCOUNTER — Encounter: Payer: Self-pay | Admitting: Rheumatology

## 2024-04-22 ENCOUNTER — Ambulatory Visit: Attending: Rheumatology | Admitting: Rheumatology

## 2024-04-22 VITALS — BP 124/80 | HR 66 | Temp 98.3°F | Resp 17 | Ht 64.0 in | Wt 161.6 lb

## 2024-04-22 DIAGNOSIS — J301 Allergic rhinitis due to pollen: Secondary | ICD-10-CM

## 2024-04-22 DIAGNOSIS — R7689 Other specified abnormal immunological findings in serum: Secondary | ICD-10-CM

## 2024-04-22 DIAGNOSIS — Z8759 Personal history of other complications of pregnancy, childbirth and the puerperium: Secondary | ICD-10-CM | POA: Diagnosis not present

## 2024-04-22 DIAGNOSIS — M79601 Pain in right arm: Secondary | ICD-10-CM

## 2024-04-22 DIAGNOSIS — M19072 Primary osteoarthritis, left ankle and foot: Secondary | ICD-10-CM

## 2024-04-22 DIAGNOSIS — F418 Other specified anxiety disorders: Secondary | ICD-10-CM

## 2024-04-22 DIAGNOSIS — L821 Other seborrheic keratosis: Secondary | ICD-10-CM

## 2024-04-22 DIAGNOSIS — R7989 Other specified abnormal findings of blood chemistry: Secondary | ICD-10-CM

## 2024-04-22 DIAGNOSIS — I10 Essential (primary) hypertension: Secondary | ICD-10-CM

## 2024-04-22 DIAGNOSIS — Z807 Family history of other malignant neoplasms of lymphoid, hematopoietic and related tissues: Secondary | ICD-10-CM

## 2024-04-22 DIAGNOSIS — Z86006 Personal history of melanoma in-situ: Secondary | ICD-10-CM

## 2024-04-22 DIAGNOSIS — Z9181 History of falling: Secondary | ICD-10-CM

## 2024-04-22 DIAGNOSIS — M1A09X0 Idiopathic chronic gout, multiple sites, without tophus (tophi): Secondary | ICD-10-CM

## 2024-04-22 DIAGNOSIS — R5383 Other fatigue: Secondary | ICD-10-CM

## 2024-04-22 DIAGNOSIS — F10288 Alcohol dependence with other alcohol-induced disorder: Secondary | ICD-10-CM

## 2024-04-22 DIAGNOSIS — J45901 Unspecified asthma with (acute) exacerbation: Secondary | ICD-10-CM

## 2024-04-22 DIAGNOSIS — M2550 Pain in unspecified joint: Secondary | ICD-10-CM | POA: Diagnosis not present

## 2024-04-22 DIAGNOSIS — M19071 Primary osteoarthritis, right ankle and foot: Secondary | ICD-10-CM

## 2024-04-22 DIAGNOSIS — I872 Venous insufficiency (chronic) (peripheral): Secondary | ICD-10-CM

## 2024-04-22 DIAGNOSIS — L719 Rosacea, unspecified: Secondary | ICD-10-CM

## 2024-04-24 ENCOUNTER — Encounter: Payer: Self-pay | Admitting: Family Medicine

## 2024-04-24 MED ORDER — FLUOXETINE HCL 40 MG PO CAPS: 40.0000 mg | ORAL_CAPSULE | Freq: Every day | ORAL | 3 refills | Status: DC

## 2024-04-24 MED ORDER — DIAZEPAM 5 MG PO TABS: 5.0000 mg | ORAL_TABLET | Freq: Two times a day (BID) | ORAL | 3 refills | Status: DC | PRN

## 2024-04-24 NOTE — Telephone Encounter (Signed)
 I stopped Alprazolam  and sent in Diazepam  to take instead. I also increased the Prozac  to 40 mg daily.

## 2024-05-07 ENCOUNTER — Ambulatory Visit (INDEPENDENT_AMBULATORY_CARE_PROVIDER_SITE_OTHER): Admitting: Family Medicine

## 2024-05-07 ENCOUNTER — Encounter: Payer: Self-pay | Admitting: Family Medicine

## 2024-05-07 VITALS — BP 100/62 | HR 55 | Temp 98.2°F | Wt 162.0 lb

## 2024-05-07 DIAGNOSIS — F418 Other specified anxiety disorders: Secondary | ICD-10-CM | POA: Diagnosis not present

## 2024-05-07 MED ORDER — VENLAFAXINE HCL ER 150 MG PO CP24: 150.0000 mg | ORAL_CAPSULE | Freq: Every day | ORAL | 2 refills | Status: DC

## 2024-05-07 MED ORDER — DIAZEPAM 10 MG PO TABS: 10.0000 mg | ORAL_TABLET | Freq: Two times a day (BID) | ORAL | 2 refills | Status: DC | PRN

## 2024-05-07 NOTE — Progress Notes (Signed)
   Subjective:    Patient ID: Kim Elliott, female    DOB: [birth year], 58 y.o.   MRN: [id number]  HPI Here to follow up on depression and anxiety. Since our last visit, she has changed back to Diazepam  from Alprazolam , and she has started on Prozac . She has increased the dose of Prozac  from 20 mg to 40 mg daily, and then to 80 mg daily for the past week. She says she feels better, but is not quite where she wants to be. She has alsodoubled the Diazepam  to 10 mg and this works well.    Review of Systems  Constitutional: Negative.   Respiratory: Negative.    Cardiovascular: Negative.   Psychiatric/Behavioral:  Positive for decreased concentration, dysphoric mood and sleep disturbance. Negative for agitation, behavioral problems, confusion, hallucinations, self-injury and suicidal ideas. The patient is nervous/anxious.        Objective:   Physical Exam Constitutional:      Appearance: Normal appearance.  Cardiovascular:     Rate and Rhythm: Normal rate and regular rhythm.     Pulses: Normal pulses.     Heart sounds: Normal heart sounds.  Pulmonary:     Effort: Pulmonary effort is normal.     Breath sounds: Normal breath sounds.  Neurological:     Mental Status: She is alert.  Psychiatric:        Mood and Affect: Mood normal.        Behavior: Behavior normal.        Thought Content: Thought content normal.           Assessment & Plan:  Depression with anxiety. We will keep the Diazepam  at 10 mg BID as needed. We will stop Prozac  and switch to Effexor  XR 150 mg daily (she has taken this in the past). Follow up in 3 weeks.  Garnette Olmsted, MD

## 2024-05-22 ENCOUNTER — Ambulatory Visit: Payer: Self-pay | Admitting: Obstetrics and Gynecology

## 2024-05-22 ENCOUNTER — Ambulatory Visit (INDEPENDENT_AMBULATORY_CARE_PROVIDER_SITE_OTHER)

## 2024-05-22 ENCOUNTER — Encounter: Payer: Self-pay | Admitting: Obstetrics and Gynecology

## 2024-05-22 ENCOUNTER — Ambulatory Visit: Admitting: Obstetrics and Gynecology

## 2024-05-22 VITALS — BP 124/86 | HR 61 | Wt 159.0 lb

## 2024-05-22 DIAGNOSIS — T148XXA Other injury of unspecified body region, initial encounter: Secondary | ICD-10-CM

## 2024-05-22 DIAGNOSIS — N950 Postmenopausal bleeding: Secondary | ICD-10-CM

## 2024-05-22 DIAGNOSIS — D219 Benign neoplasm of connective and other soft tissue, unspecified: Secondary | ICD-10-CM

## 2024-05-22 DIAGNOSIS — N8003 Adenomyosis of the uterus: Secondary | ICD-10-CM

## 2024-05-22 DIAGNOSIS — E2839 Other primary ovarian failure: Secondary | ICD-10-CM

## 2024-05-22 DIAGNOSIS — Z712 Person consulting for explanation of examination or test findings: Secondary | ICD-10-CM

## 2024-05-22 DIAGNOSIS — W19XXXD Unspecified fall, subsequent encounter: Secondary | ICD-10-CM

## 2024-05-22 DIAGNOSIS — Z01419 Encounter for gynecological examination (general) (routine) without abnormal findings: Secondary | ICD-10-CM

## 2024-05-22 DIAGNOSIS — Z975 Presence of (intrauterine) contraceptive device: Secondary | ICD-10-CM

## 2024-05-22 DIAGNOSIS — G44001 Cluster headache syndrome, unspecified, intractable: Secondary | ICD-10-CM
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
## 2024-05-22 NOTE — Patient Instructions (Signed)
 Locations you can schedule your bone scan are:  The [facility] bone scan location scheduling line is [phone]  [facility] is [phone]  [facility] radiology [phone]   [facility] [phone]  [facility]: [phone]    Another option if they are behind is [facility] and their number is [phone].  I can send the referral if you want to go there.    Please let me know if you have any trouble scheduling. This is important for management of osteoporosis or osteopenia.   I can sit down with you after the scan to discuss results and treatment.  Dr. Glennon

## 2024-05-26 NOTE — Telephone Encounter (Signed)
 See surgery referral.   Encounter closed.

## 2024-05-27 ENCOUNTER — Ambulatory Visit: Admitting: Family Medicine

## 2024-05-27 ENCOUNTER — Encounter: Payer: Self-pay | Admitting: Family Medicine

## 2024-05-27 VITALS — BP 110/68 | HR 66 | Temp 98.4°F | Wt 160.6 lb

## 2024-05-27 DIAGNOSIS — N950 Postmenopausal bleeding: Secondary | ICD-10-CM | POA: Diagnosis not present

## 2024-05-27 DIAGNOSIS — G8929 Other chronic pain: Secondary | ICD-10-CM | POA: Insufficient documentation

## 2024-05-27 DIAGNOSIS — M542 Cervicalgia: Secondary | ICD-10-CM | POA: Diagnosis not present

## 2024-05-27 DIAGNOSIS — F418 Other specified anxiety disorders: Secondary | ICD-10-CM

## 2024-05-27 DIAGNOSIS — I10 Essential (primary) hypertension: Secondary | ICD-10-CM | POA: Diagnosis not present

## 2024-05-27 DIAGNOSIS — R519 Headache, unspecified: Secondary | ICD-10-CM

## 2024-05-27 MED ORDER — VENLAFAXINE HCL ER 150 MG PO CP24: 150.0000 mg | ORAL_CAPSULE | Freq: Every day | ORAL | 5 refills | Status: AC

## 2024-05-27 MED ORDER — METOPROLOL SUCCINATE ER 100 MG PO TB24: 100.0000 mg | ORAL_TABLET | Freq: Every day | ORAL | 5 refills | Status: AC

## 2024-05-27 MED ORDER — DIAZEPAM 10 MG PO TABS: 10.0000 mg | ORAL_TABLET | Freq: Three times a day (TID) | ORAL | 2 refills | Status: AC | PRN

## 2024-05-27 NOTE — Progress Notes (Signed)
   Subjective:    Patient ID: Kim Elliott, female    DOB: [birth year], 58 y.o.   MRN: [id number]  HPI Here to follow up on multiple issues. We have been following her for depression with anxiety, and it seems we have finally found a combination of medications that works for her. She is taking Effexor  XR 150 mg daily and Diazepam  10 mg TID. She still has trouble sleeping, but her stress is under much better control. Her BP has also settled down nicely. She is seeing Dr. Duwayne for neck pain,  and she had an epidural steroid injection last week. So far this does not seem to be helping much. She is also seeing her GYN, Dr. Almarie Single, for vaginal bleeding. A recent pelvic US  revealed adenomyosis of her uterus, so she is planning to do a hysterectomy in the near future. Dr. Single also referred her to Neurology for her headaches.   Review of Systems  Constitutional: Negative.   Respiratory: Negative.    Cardiovascular: Negative.   Genitourinary:  Positive for vaginal bleeding.  Neurological:  Positive for headaches.  Psychiatric/Behavioral:  Positive for decreased concentration, dysphoric mood and sleep disturbance. Negative for agitation, behavioral problems, confusion, hallucinations and suicidal ideas. The patient is nervous/anxious.        Objective:   Physical Exam Constitutional:      Appearance: Normal appearance.  Cardiovascular:     Rate and Rhythm: Normal rate and regular rhythm.     Pulses: Normal pulses.     Heart sounds: Normal heart sounds.  Pulmonary:     Effort: Pulmonary effort is normal.     Breath sounds: Normal breath sounds.  Neurological:     Mental Status: She is alert and oriented to person, place, and time. Mental status is at baseline.  Psychiatric:        Behavior: Behavior normal.        Thought Content: Thought content normal.     Comments: She is anxious            Assessment & Plan:  Her depression and anxiety are now reasonably  controlled. Her HTN is well controlled. She will follow up with Dr. Duwayne for the neck pain. She will have the hysterectomy surgery as above. She will see Neurology for the headaches.  Garnette Olmsted, MD

## 2024-06-13 ENCOUNTER — Telehealth: Payer: Self-pay | Admitting: Family Medicine

## 2024-06-13 DIAGNOSIS — Z0279 Encounter for issue of other medical certificate: Secondary | ICD-10-CM

## 2024-06-13 NOTE — Telephone Encounter (Signed)
 Patient dropped off document Surgical Clearance, to be filled out by provider. Patient requested to send it back via Fax within 7-days. Document is located in providers tray at front office.Please advise at fax [phone]

## 2024-06-17 ENCOUNTER — Encounter: Payer: Self-pay | Admitting: Family Medicine

## 2024-06-17 ENCOUNTER — Encounter: Admitting: Rheumatology

## 2024-06-17 NOTE — Telephone Encounter (Signed)
 The form is ready

## 2024-06-17 NOTE — Telephone Encounter (Signed)
 Pt Surgical clearance form was received completed and successfully  faxed to [facility]

## 2024-06-20 NOTE — Telephone Encounter (Signed)
 Pt surgical clearance form was signed and faxed as requested

## 2024-07-07 ENCOUNTER — Ambulatory Visit: Attending: Surgery | Admitting: Surgery

## 2024-07-07 NOTE — Progress Notes (Deleted)
 "                                    [facility] [place]  Patient name: Kim Elliott MRN: [id number] DOB: 11-19-1965 Sex: female   REASON FOR VISIT:    Follow-up  HISOTRY OF PRESENT ILLNESS:    Kim Elliott is a 59 y.o. female who was initially evaluated in September 2025 for venous insufficiency which had been diagnosed several decades prior.  She reports having had at least 5 venous procedures on both legs but is not sure exactly what had been done.  Her last procedure was in 2002.  These were performed for painful varicosities with leg swelling.  In September she noted 1 large varicose vein in her right leg that had been present for about 5 months.  Since that time she had been dealing with worsening swelling in her right ankle and lower leg.  This gets worse throughout the course of the day if she is standing.  It causes her a fair amount of throbbing and pain.  She does wear compression stockings and tries to keep her legs elevated.   PAST MEDICAL HISTORY:   Past Medical History:  Diagnosis Date   Abnormal Pap smear of cervix    Adenomyosis    Allergy    Anemia    completed iron   Anxiety    Cancer (HCC)    melanoma   Closed fracture of left clavicle 05/05/2014   Depression    Dysmenorrhea    Fall    8/25 with fracture. dxa sent   Fibroids    GERD (gastroesophageal reflux disease)    possibly per pt   Hyperlipidemia    Hypertension    IUD (intrauterine device) in place    mirena  IUD placed 2022 for menorrhagia. 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)    Migraine without aura      FAMILY HISTORY:   Family History  Problem Relation Age of Onset   Hypertension Father    Healthy Brother    Arthritis Maternal Grandmother    Crohn's disease Son    Arthritis Son    Colon cancer Neg Hx    Colon polyps Neg Hx    Esophageal cancer Neg Hx    Stomach cancer Neg Hx     Rectal cancer Neg Hx    Pancreatic cancer Neg Hx     SOCIAL HISTORY:   Social History   Tobacco Use   Smoking status: Former    Current packs/day: 0.00    Types: Cigarettes    Quit date: 04/30/1984    Years since quitting: 40.2    Passive exposure: Past   Smokeless tobacco: Never  Substance Use Topics   Alcohol use: Yes    Comment: wine occ     ALLERGIES:   Allergies[1]   CURRENT MEDICATIONS:   Current Outpatient Medications  Medication Sig Dispense Refill   CELEBREX  200 MG capsule Take 1 capsule every day by oral route as needed for 30 days.     cyclobenzaprine  (FLEXERIL ) 10 MG tablet Take 10 mg by mouth 3 (three) times daily as needed. for muscle spams     diazepam  (VALIUM ) 10 MG tablet Take 1 tablet (10 mg total) by mouth every 8 (eight) hours as needed for  anxiety. 90 tablet 2   metoprolol  succinate (TOPROL -XL) 100 MG 24 hr tablet Take 1 tablet (100 mg total) by mouth daily. Take with or immediately following a meal. 30 tablet 5   Multiple Vitamin (MULTIVITAMIN) tablet Take 4 tablets by mouth daily.     venlafaxine  XR (EFFEXOR -XR) 150 MG 24 hr capsule Take 1 capsule (150 mg total) by mouth daily with breakfast. 30 capsule 5   No current facility-administered medications for this visit.    REVIEW OF SYSTEMS:   [X]  denotes positive finding, [ ]  denotes negative finding Cardiac  Comments:  Chest pain or chest pressure: ***   Shortness of breath upon exertion:    Short of breath when lying flat:    Irregular heart rhythm:        Vascular    Pain in calf, thigh, or hip brought on by ambulation:    Pain in feet at night that wakes you up from your sleep:     Blood clot in your veins:    Leg swelling:         Pulmonary    Oxygen at home:    Productive cough:     Wheezing:         Neurologic    Sudden weakness in arms or legs:     Sudden numbness in arms or legs:     Sudden onset of difficulty speaking or slurred speech:    Temporary loss of vision in  one eye:     Problems with dizziness:         Gastrointestinal    Blood in stool:     Vomited blood:         Genitourinary    Burning when urinating:     Blood in urine:        Psychiatric    Major depression:         Hematologic    Bleeding problems:    Problems with blood clotting too easily:        Skin    Rashes or ulcers:        Constitutional    Fever or chills:      PHYSICAL EXAM:   There were no vitals filed for this visit.  GENERAL: The patient is a well-nourished female, in no acute distress. The vital signs are documented above. CARDIAC: There is a regular rate and rhythm.  VASCULAR: *** PULMONARY: Non-labored respirations ABDOMEN: Soft and non-tender with normal pitched bowel sounds.  MUSCULOSKELETAL: There are no major deformities or cyanosis. NEUROLOGIC: No focal weakness or paresthesias are detected. SKIN: There are no ulcers or rashes noted. PSYCHIATRIC: The patient has a normal affect.  STUDIES:   I have reviewed the following: Venous Reflux Times  +--------------+---------+------+-----------+------------+--------+  RIGHT        Reflux NoRefluxReflux TimeDiameter cmsComments                          Yes                                   +--------------+---------+------+-----------+------------+--------+  CFV                    yes   >1 second                       +--------------+---------+------+-----------+------------+--------+  FV mid  no                                              +--------------+---------+------+-----------+------------+--------+  Popliteal    no                                              +--------------+---------+------+-----------+------------+--------+  GSV at SFJ              yes    >500 ms      1.02              +--------------+---------+------+-----------+------------+--------+  GSV prox thighno                            0.49               +--------------+---------+------+-----------+------------+--------+  GSV mid thigh no                            0.43              +--------------+---------+------+-----------+------------+--------+  GSV dist thighno                            0.51              +--------------+---------+------+-----------+------------+--------+  GSV at knee             yes    >500 ms      0.44              +--------------+---------+------+-----------+------------+--------+  GSV prox calf no                            0.37              +--------------+---------+------+-----------+------------+--------+  GSV mid calf  no                            0.37              +--------------+---------+------+-----------+------------+--------+  SSV at [facility]    no                            0.46              +--------------+---------+------+-----------+------------+--------+  SSV prox calf           yes    >500 ms      0.38              +--------------+---------+------+-----------+------------+--------+  SSV mid calf  no                            0.28              +--------------+---------+------+-----------+------------+--------+  AAGSV Prox              yes    >500 ms      0.5               +--------------+---------+------+-----------+------------+--------+  AAGSV Mid               yes    >500 ms      0.51              +--------------+---------+------+-----------+------------+--------+       MEDICAL ISSUES:   ***    Malvina New, IV, MD, FACS [facility] [phone] Pager [phone]     [1]  Allergies Allergen Reactions   Codeine Nausea And Vomiting   Oxycodone  Nausea And Vomiting    hallucinations   Vicodin [Hydrocodone-Acetaminophen ] Nausea And Vomiting    hallucinations   "

## 2024-07-22 ENCOUNTER — Ambulatory Visit: Admitting: Obstetrics and Gynecology

## 2024-07-22 ENCOUNTER — Encounter: Payer: Self-pay | Admitting: Obstetrics and Gynecology

## 2024-07-22 VITALS — BP 114/80 | HR 78 | Ht 63.75 in | Wt 171.0 lb

## 2024-07-22 DIAGNOSIS — N8003 Adenomyosis of the uterus: Secondary | ICD-10-CM | POA: Diagnosis not present

## 2024-07-22 DIAGNOSIS — D219 Benign neoplasm of connective and other soft tissue, unspecified: Secondary | ICD-10-CM | POA: Diagnosis not present

## 2024-07-22 DIAGNOSIS — Z01818 Encounter for other preprocedural examination: Secondary | ICD-10-CM | POA: Diagnosis not present

## 2024-07-22 DIAGNOSIS — Z975 Presence of (intrauterine) contraceptive device: Secondary | ICD-10-CM

## 2024-07-22 DIAGNOSIS — N950 Postmenopausal bleeding: Secondary | ICD-10-CM

## 2024-07-22 MED ORDER — IBUPROFEN 800 MG PO TABS: 800.0000 mg | ORAL_TABLET | Freq: Three times a day (TID) | ORAL | 1 refills | Status: DC | PRN

## 2024-07-22 MED ORDER — HYDROMORPHONE HCL 2 MG PO TABS: 2.0000 mg | ORAL_TABLET | Freq: Four times a day (QID) | ORAL | 0 refills | Status: AC | PRN

## 2024-07-22 MED ORDER — ESTRADIOL 0.01 % VA CREA: 1.0000 | TOPICAL_CREAM | Freq: Every day | VAGINAL | 3 refills | Status: AC

## 2024-07-22 MED ORDER — METOCLOPRAMIDE HCL 10 MG PO TABS: 10.0000 mg | ORAL_TABLET | Freq: Three times a day (TID) | ORAL | 0 refills | Status: AC | PRN
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
## 2024-07-22 NOTE — Patient Instructions (Signed)
 Robotic Laparoscopic Hysterectomy, Care After  The following information offers guidance on how to care for yourself after your procedure. Your health care provider may also give you more specific instructions. If you have problems or questions, contact your health care provider. What can I expect after the procedure? After the procedure, it is common to have: Pain, bruising, and numbness around your incisions. Tiredness (fatigue).  Abdominal bloating Poor appetite. Chest discomfort that radiates to your shoulder from the carbon dioxide gas for a few days after  Vaginal discharge or spotting. You will need to use a sanitary pad after this procedure.  HEAVY BLEEDING LIKE A PERIOD IS NOT NORMAL.  PLEASE CALL YOUR PROVIDER IF SOAKING A PAD or have copious discharge and or pain.  Feelings of sadness or other emotions.  If your ovaries were also removed, it is also common to have symptoms of menopause, such as hot flashes, night sweats, and lack of sleep (insomnia).  Ovaries should stay in if at all possible until at least the age of 62. Follow these instructions at home: Medicines Take over-the-counter and prescription medicines only as told by your health care provider. Ask your health care provider if the medicine prescribed to you: Requires you to avoid driving or using machinery. You cannot drive for 24 hours after anesthesia Can cause constipation. You may need to take these actions to prevent or treat constipation: Drink enough fluid to keep your urine pale yellow. Take over-the-counter or prescription medicines. Eat foods that are high in fiber, such as beans, whole grains, and fresh fruits and vegetables. Limit foods that are high in fat and processed sugars, such as fried or sweet foods.  Also, avoid spicy foods.  NAUSEA IS COMMON THE FIRST NIGHT OF SURGERY.  IF IT LASTS BEYOND 24 HOURS, CALL YOUR PROVIDER.  NAUSEA MEDICATION WAS GIVEN AT YOUR PREOP APPOINTMENT THAT YOU CAN TAKE  AFTER SURGERY. Incision care  Follow instructions from your health care provider about how to take care of your incisions. Make sure you: LEAVE INCISION OPEN AND DRY-NO BANDAGES Leave stitches (sutures), skin glue, or adhesive strips in place UNTIL 2 WEEKS THEN REMOVE IN THE SHOWER.  If adhesive strip edges start to loosen and curl up, you may trim the loose edges. Check your incision areas every day for signs of infection. Check for: More redness, swelling, or pain. Fluid or blood. Warmth. Pus or a bad smell. Activity  Rest as told by your health care provider. Avoid sitting for a long time without moving. Get up to take short walks every 1-2 hours. This is important to improve blood flow and breathing. Ask for help if you feel weak or unsteady.  If you are sore or tired, rest. Return to your normal activities as told by your health care provider. Ask your health care provider what activities are safe for you. Do not lift, push or pull anything that is heavier than 13 lb (4.5 kg), or the limit that you are told, for 6 WEEKS after surgery or until your health care provider says that it is safe. If you were given a sedative during the procedure, it can affect you for several hours. Do not drive or operate machinery until your health care provider says that it is safe. Lifestyle Do not use any products that contain nicotine or tobacco. These products include cigarettes, chewing tobacco, and vaping devices, such as e-cigarettes. These can delay healing after surgery. If you need help quitting, ask your health care provider.  Do not drink alcohol until your health care provider approves. Take a daily multivitamin and keep a high protein diet for wound healing  DO NOT HAVE INTERCOURSE UNTIL YOU ARE INSTRUCTED THAT IT IS SAFE TO DO SO  Post operative appointments need to be scheduled at 2, 6 and 10 weeks.  You can come anytime before these with any concerns.   Discussed and reviewed with patient  risks with early intercourse or use of any foreign objects (externally or internally) can increase your risk including but not limited to the risk of vaginal cuff separation and or infection, risks for bowel involvement, risk for emergent surgery, and hospital admission with need for antibiotics.  Discussed in cases with cuff separation and bowel involvement there may be the need for colostomy placement as well.  In no situation should she have intercourse unless cleared to do so.  This can be anywhere from 10 weeks or longer after surgery.  General instructions YOU MAY TAKE SHOWERS ONLY FOR 2 WEEKS AFTER SURGERY, THEN YOU MAY USE TUBS AND HOT TUBS OR SWIM AFTER THAT Do not douche, use tampons, or have sex for at least 10 weeks, or possibly longer. You will need to have an exam done in the office to be cleared to have intercourse. If you struggle with physical or emotional changes after your procedure, speak with your health care provider or a therapist.  IF YOU HAVE BURNING WITH URINATION, PLEASE CALL YOUR DOCTOR. BLADDER INFECTIONS MAY OCCUR AFTER SURGERY Try to have someone at home with you for the first week to help with your daily chores.  Most patients are driving by the end of the first week after the robotic hysterectomy. Wear compression stockings as told by your health care provider. These stockings help to prevent blood clots and reduce swelling in your legs. Keep all follow-up visits. This is important. Contact a health care provider if: You have any of these signs of infection: Chills or a fever 148f OR GREATER. More redness, swelling, or pain around an incision. Fluid or blood coming from an incision. Warmth coming from an incision. Pus or a bad smell coming from an incision. Burning with urination. Urinary frequency or cramping.   IF YOU HAVE THESE SYMPTOMS, PLEASE CALL THE OFFICE TO COME EVALUATE FOR A BLADDER INFECTION AT [phone] An incision opens. You feel dizzy or  light-headed. You have pain or bleeding when you urinate, or you are unable to urinate. You have abnormal vaginal discharge. You have pain that does not get better with medicine. Get help right away if: You have a fever and your symptoms suddenly get worse. You have severe abdominal pain. Heavy vaginal bleeding, like a period You have chest pain or shortness of breath. You may have chest pain and shortness of breath from the CO2 gas for a few days after surgery.  This is very common.  Walking, Gas-X and motrin  will usually help relieve this discomfort You faint. You have pain, swelling, or redness in your leg.  These symptoms may represent a serious problem that is an emergency. Do not wait to see if the symptoms will go away. Get medical help right away. Call your local emergency services (911 in the [place] Do not drive yourself to the hospital. Summary  CONSTIPATION MEDICATION AFTER SURGERY: COLACE, MOM, MIRALAX , GAS X are all helpful to have on hand, if needed.  FILL ALL POSTOP MEDICATION BEFORE SURGERY   [email or web address] is a nice blog site for women preparing for the  robotic hysterectomy

## 2024-07-24 ENCOUNTER — Encounter: Payer: Self-pay | Admitting: *Deleted

## 2024-07-29 ENCOUNTER — Encounter (HOSPITAL_COMMUNITY): Payer: Self-pay | Admitting: Obstetrics and Gynecology

## 2024-07-29 NOTE — Progress Notes (Signed)
 Surgical Instructions  Your procedure is scheduled on :  Friday January 30th, 2026 Report to [facility] Main Entrance A at 7:30 AM, then check in the Admitting office. Any questions or running late day of surgery :  call [phone]  Questions prior to your surgery day:  call [phone], Monday -- Friday 8am - 4pm. If you experience any cold or flu symptoms such as cough, fever, chills, shortness of breath, etc. between now and you scheduled surgery, please notify your surgeon office.   Remember: Do Not eat any food after midnight the night before surgery. You may have clear liquids from midnight night before surgery until 6:30 AM.   Clear liquids allowed are:  Water             Carbonated Beverages (diabetics choose diet or no sugar options)  Clear Tea ( no milk, no honey, etc.)  Black coffee ( NO MILK, CREAM OR POWDERED CREAMER OF ANY KIND)  Sport drinks, like Gatorade (diabetes choose diet or no sugar options)  NO clear liquid after 6:30 AM day of surgery.  This includes No water,  candy,  gum, and mints.  Take these medicines the morning of surgery with A SIPS OF WATER:  Valium -PRN. Metoprolol  and Effexor .    May take these medicines IF NEEDED:  NONE   One week prior to surgery, STOP taking any Aspirin (unless otherwise instructed by your surgeon) Aleve, Naproxen, ibuprofen , Motrin , Advil , Goody's, BC's, all herbal medications/ supplements, fish oil, and non-prescription vitamins.  Do NOT Smoke (tobacco/ vaping) and Do Not drink alcohol for 24 hours prior to your procedure.  For those patients that use a CPAP.  Please bring your CPAP/ mask/ tubing with them day of surgery . Anesthesia may ask recovery room nurse to use and if you stay the night you be asked to use it.  You will be asked to removed any contacts, glasses, piercing's, hearing aid's, dentures/ partials prior to surgery.  Please bring cases/ container/ solution/ etc., for them day of surgery.   Patients discharged  the day of surgery will NOT be allowed to drive home.  You must have responsible driver and caregiver to stay at home with you the next 24 hours.  SURGICAL WAITING ROOM VISITATION Patients may have no more than 2 support people in the waiting area - if more than 2 , these visitors may rotate.  Pre-op nurse will coordinate an appropriate time for 1 Adult support person, who may not rotate, to accompany patient in pre-op.  Aware some patients may have certain circumstances, speak to pre-op nurse day of surgery.  Children under the age 36 must have an adult with them who is not the patient and must remain in the main waiting area with an adult.  If the patient needs to stay at the hospital during part of their recovery, the visitor guidelines for inpatient rooms apply.  Please refer to the [facility] website for the visitor guidelines for any additional information.  If you received a COVID test during your pre-op visit it is requested that you wear a mask when out in public, stay away from anyone that may not be feeling well and notify your surgeon if you develop symptoms.  If you have been in contact with anyone that has tested positive in the past 10 days notify your surgeon.     [place] - Preparing for Surgery  Before surgery, you can play an important role. Because skin is not sterile, it  needs to be as free of germs as possible. You can reduce the number of germs on your skin by washing with CHG (chlorhexidine gluconate) soap before surgery. CHG is an antiseptic cleaner which kills germs and bonds with the skin to continue killing germs even after washing. Oral hygiene is also important in reducing the risk of infection. Remember to brush your teeth with your regular toothpaste the morning of surgery.  Please DO NOT use if you have an allergy to CHG or antibacterial soaps. If your skin becomes reddened/irritated stop using the CHG and inform your Pre-op nurse day of surgery.  DO NOT shave  (including legs and genital area) for at least 48 hours prior to your CHG shower.   Please follow these instructions carefully:  Shower with CHG soap the night before surgery. If you choose to wash your hair, wash your hair first as usual with your normal shampoo. After you shampoo, rinse your hair and body thoroughly to remove the shampoo. Use CHG as you would any other liquid soap. You can apply CHG directly to the skin and wash gently with a clean washcloth or shower sponge. Apply the CHG soap to your body ONLY FROM THE NECK DOWN. Do not use on open wounds or open sores. Avoid contact with your eyes, ears, mouth, and genitals (private parts). Wash genitals (private parts) with your normal soap. Wash thoroughly, paying special attention to the area where your surgery will be performed. Thoroughly rinse your body with warm water from the neck down. DO NOT shower/wash with your normal soap after using and rinsing off the CHG soap. DO NOT use lotions, oils, etc., after showering with CHG. Pat yourself dry with a clean towel. Wear clean pajamas. Place clean sheets on your bed the night of your CHG shower and do not sleep with pets.  Day of Surgery  DO NOT Apply any lotions,  powder,  oils,  deodorants (may use underarm deodorant),  cologne/  perfumes  or makeup Do Not wear jewelry /  piercing's/  metal/  permanent jewelry must be removed prior to arrival day of surgery. (No plastic piercing) Do Not wear nail polish,  gel polish,  artificial nails, or any other type of covering on natural finger nails (toe nails are okay) Remember to brush your teeth and rinse mouth out. Put on clean / comfortable clothes. [facility] is not responsible for valuables/ personal belongings

## 2024-07-29 NOTE — Progress Notes (Signed)
 Spoke w/ via phone for pre-op interview--- [facility]----  Lab appt. 07/31/24 at 10AM. CBC, T&S and BMP.       Lab results------Current EKG 09/25/23. COVID test -----patient states asymptomatic no test needed Arrive at -------0730 NPO after MN NO Solid Food.  Clear liquids from MN until---0630 Pre-Surgery Ensure or G2:  Med rec completed Medications to take morning of surgery -----Valium -PRN. Metoprolol  and Effexor .  Diabetic medication -----  GLP1 agonist last dose: GLP1 instructions:  Patient instructed no nail polish to be worn day of surgery Patient instructed to bring photo id and insurance card day of surgery Patient aware to have Driver (ride ) / caregiver    for 24 hours after surgery - Son Kim Elliott Patient Special Instructions -----CHG shower night before surgery. Pre-Op special Instructions -----  Patient verbalized understanding of instructions that were given at this phone interview. Patient denies chest pain, sob, fever, cough at the interview.

## 2024-07-30 ENCOUNTER — Ambulatory Visit: Admitting: Rheumatology

## 2024-07-31 ENCOUNTER — Ambulatory Visit: Payer: Self-pay | Admitting: Obstetrics and Gynecology

## 2024-07-31 ENCOUNTER — Encounter (HOSPITAL_COMMUNITY)
Admission: RE | Admit: 2024-07-31 | Discharge: 2024-07-31 | Disposition: A | Discharge status: Home / Self Care | Source: Ambulatory Visit | Attending: Obstetrics and Gynecology | Admitting: Obstetrics and Gynecology

## 2024-07-31 DIAGNOSIS — Z01812 Encounter for preprocedural laboratory examination: Secondary | ICD-10-CM | POA: Insufficient documentation

## 2024-07-31 DIAGNOSIS — Z01818 Encounter for other preprocedural examination: Secondary | ICD-10-CM

## 2024-07-31 DIAGNOSIS — I10 Essential (primary) hypertension: Secondary | ICD-10-CM | POA: Insufficient documentation

## 2024-07-31 LAB — CBC
HCT: 43.9 % (ref 36.0–46.0)
Hemoglobin: 15.1 g/dL — ABNORMAL HIGH (ref 12.0–15.0)
MCH: 32.1 pg (ref 26.0–34.0)
MCHC: 34.4 g/dL (ref 30.0–36.0)
MCV: 93.2 fL (ref 80.0–100.0)
Platelets: 230 10*3/uL (ref 150–400)
RBC: 4.71 MIL/uL (ref 3.87–5.11)
RDW: 12.9 % (ref 11.5–15.5)
WBC: 4.9 10*3/uL (ref 4.0–10.5)
nRBC: 0 % (ref 0.0–0.2)

## 2024-07-31 LAB — BASIC METABOLIC PANEL WITH GFR
Anion gap: 10 (ref 5–15)
BUN: 12 mg/dL (ref 6–20)
CO2: 26 mmol/L (ref 22–32)
Calcium: 10.1 mg/dL (ref 8.9–10.3)
Chloride: 101 mmol/L (ref 98–111)
Creatinine, Ser: 0.76 mg/dL (ref 0.44–1.00)
GFR, Estimated: >60 mL/min
Glucose, Bld: 98 mg/dL (ref 70–99)
Potassium: 4 mmol/L (ref 3.5–5.1)
Sodium: 137 mmol/L (ref 135–145)

## 2024-07-31 LAB — TYPE AND SCREEN
ABO/RH(D): O POS
Antibody Screen: NEGATIVE

## 2024-07-31 MED ORDER — SODIUM CHLORIDE 0.9 % IV SOLN
Freq: Once | INTRAVENOUS | Status: DC
  Filled 2024-07-31: qty 10

## 2024-08-01 ENCOUNTER — Ambulatory Visit (HOSPITAL_COMMUNITY): Admitting: Anesthesiology

## 2024-08-01 ENCOUNTER — Encounter (HOSPITAL_COMMUNITY)
Admission: RE | Disposition: A | Discharge status: Home / Self Care | Payer: Self-pay | Source: Home / Self Care | Attending: Obstetrics and Gynecology

## 2024-08-01 ENCOUNTER — Ambulatory Visit (HOSPITAL_COMMUNITY)
Admission: RE | Admit: 2024-08-01 | Discharge: 2024-08-01 | Disposition: A | Discharge status: Home / Self Care | Attending: Obstetrics and Gynecology | Admitting: Obstetrics and Gynecology

## 2024-08-01 ENCOUNTER — Encounter (HOSPITAL_COMMUNITY): Payer: Self-pay | Admitting: Obstetrics and Gynecology

## 2024-08-01 DIAGNOSIS — N950 Postmenopausal bleeding: Secondary | ICD-10-CM

## 2024-08-01 DIAGNOSIS — D219 Benign neoplasm of connective and other soft tissue, unspecified: Secondary | ICD-10-CM

## 2024-08-01 DIAGNOSIS — Z975 Presence of (intrauterine) contraceptive device: Secondary | ICD-10-CM

## 2024-08-01 DIAGNOSIS — N8003 Adenomyosis of the uterus: Secondary | ICD-10-CM | POA: Diagnosis not present

## 2024-08-01 DIAGNOSIS — Z01818 Encounter for other preprocedural examination: Secondary | ICD-10-CM

## 2024-08-01 DIAGNOSIS — I10 Essential (primary) hypertension: Secondary | ICD-10-CM

## 2024-08-01 LAB — POCT PREGNANCY, URINE: Preg Test, Ur: NEGATIVE

## 2024-08-01 LAB — ABO/RH: ABO/RH(D): O POS

## 2024-08-01 MED ORDER — HYDROMORPHONE HCL 1 MG/ML IJ SOLN: 0.2000 mg | INTRAMUSCULAR | Status: DC | PRN

## 2024-08-01 MED ORDER — KETOROLAC TROMETHAMINE 30 MG/ML IJ SOLN
INTRAMUSCULAR | Status: DC | PRN
  Administered 2024-08-01: 15 mg via INTRAVENOUS

## 2024-08-01 MED ORDER — FENTANYL CITRATE (PF) 100 MCG/2ML IJ SOLN
INTRAMUSCULAR | Status: AC
  Filled 2024-08-01: qty 2

## 2024-08-01 MED ORDER — LACTATED RINGERS IV SOLN: INTRAVENOUS | Status: DC

## 2024-08-01 MED ORDER — ONDANSETRON HCL 4 MG PO TABS: 4.0000 mg | ORAL_TABLET | Freq: Four times a day (QID) | ORAL | Status: DC | PRN

## 2024-08-01 MED ORDER — SODIUM CHLORIDE 0.9 % IV SOLN
INTRAVENOUS | Status: AC
  Filled 2024-08-01: qty 2

## 2024-08-01 MED ORDER — BUPIVACAINE HCL (PF) 0.5 % IJ SOLN
INTRAMUSCULAR | Status: DC | PRN
  Administered 2024-08-01: 20 mL

## 2024-08-01 MED ORDER — HYDROMORPHONE HCL 1 MG/ML IJ SOLN
INTRAMUSCULAR | Status: AC
  Filled 2024-08-01: qty 1

## 2024-08-01 MED ORDER — BUPIVACAINE HCL (PF) 0.5 % IJ SOLN
INTRAMUSCULAR | Status: AC
  Filled 2024-08-01: qty 30

## 2024-08-01 MED ORDER — GABAPENTIN 100 MG PO CAPS
100.0000 mg | ORAL_CAPSULE | Freq: Once | ORAL | Status: AC
  Administered 2024-08-01: 100 mg via ORAL

## 2024-08-01 MED ORDER — PANTOPRAZOLE SODIUM 40 MG IV SOLR: 40.0000 mg | Freq: Every day | INTRAVENOUS | Status: DC

## 2024-08-01 MED ORDER — HYDROMORPHONE HCL 1 MG/ML IJ SOLN
0.2500 mg | INTRAMUSCULAR | Status: DC | PRN
  Administered 2024-08-01 (×4): 0.5 mg via INTRAVENOUS

## 2024-08-01 MED ORDER — KETAMINE HCL 50 MG/5ML IJ SOSY
PREFILLED_SYRINGE | INTRAMUSCULAR | Status: AC
  Filled 2024-08-01: qty 5

## 2024-08-01 MED ORDER — CHLORHEXIDINE GLUCONATE 0.12 % MT SOLN
15.0000 mL | Freq: Once | OROMUCOSAL | Status: AC
  Administered 2024-08-01: 15 mL via OROMUCOSAL

## 2024-08-01 MED ORDER — AMISULPRIDE (ANTIEMETIC) 5 MG/2ML IV SOLN: 10.0000 mg | Freq: Once | INTRAVENOUS | Status: DC | PRN

## 2024-08-01 MED ORDER — KETAMINE HCL 50 MG/5ML IJ SOSY
PREFILLED_SYRINGE | INTRAMUSCULAR | Status: DC | PRN
  Administered 2024-08-01 (×2): 10 mg via INTRAVENOUS
  Administered 2024-08-01: 30 mg via INTRAVENOUS

## 2024-08-01 MED ORDER — GLYCOPYRROLATE 0.2 MG/ML IJ SOLN
INTRAMUSCULAR | Status: DC | PRN
  Administered 2024-08-01: .2 mg via INTRAVENOUS

## 2024-08-01 MED ORDER — ONDANSETRON HCL 4 MG/2ML IJ SOLN: 4.0000 mg | Freq: Four times a day (QID) | INTRAMUSCULAR | Status: DC | PRN

## 2024-08-01 MED ORDER — POVIDONE-IODINE 10 % EX SWAB
2.0000 | Freq: Once | CUTANEOUS | Status: AC
  Administered 2024-08-01: 2 via TOPICAL

## 2024-08-01 MED ORDER — SUGAMMADEX SODIUM 200 MG/2ML IV SOLN
INTRAVENOUS | Status: DC | PRN
  Administered 2024-08-01: 300 mg via INTRAVENOUS

## 2024-08-01 MED ORDER — PHENYLEPHRINE HCL-NACL 20-0.9 MG/250ML-% IV SOLN
INTRAVENOUS | Status: DC | PRN
  Administered 2024-08-01: 10 ug/min via INTRAVENOUS

## 2024-08-01 MED ORDER — ORAL CARE MOUTH RINSE: 15.0000 mL | Freq: Once | OROMUCOSAL | Status: AC

## 2024-08-01 MED ORDER — ROCURONIUM BROMIDE 10 MG/ML (PF) SYRINGE
PREFILLED_SYRINGE | INTRAVENOUS | Status: DC | PRN
  Administered 2024-08-01: 20 mg via INTRAVENOUS
  Administered 2024-08-01: 10 mg via INTRAVENOUS
  Administered 2024-08-01: 70 mg via INTRAVENOUS

## 2024-08-01 MED ORDER — SODIUM CHLORIDE 0.9 % IV SOLN
INTRAVENOUS | Status: DC | PRN
  Administered 2024-08-01: 1000 mL

## 2024-08-01 MED ORDER — ACETAMINOPHEN 500 MG PO TABS
ORAL_TABLET | ORAL | Status: AC
  Filled 2024-08-01: qty 2

## 2024-08-01 MED ORDER — FENTANYL CITRATE (PF) 250 MCG/5ML IJ SOLN
INTRAMUSCULAR | Status: DC | PRN
  Administered 2024-08-01: 100 ug via INTRAVENOUS

## 2024-08-01 MED ORDER — MIDAZOLAM HCL 2 MG/2ML IJ SOLN
INTRAMUSCULAR | Status: AC
  Filled 2024-08-01: qty 2

## 2024-08-01 MED ORDER — ACETAMINOPHEN 325 MG PO TABS: 650.0000 mg | ORAL_TABLET | ORAL | Status: DC | PRN

## 2024-08-01 MED ORDER — CHLORHEXIDINE GLUCONATE 0.12 % MT SOLN
OROMUCOSAL | Status: AC
  Filled 2024-08-01: qty 15

## 2024-08-01 MED ORDER — METRONIDAZOLE 500 MG/100ML IV SOLN
500.0000 mg | INTRAVENOUS | Status: AC
  Administered 2024-08-01: 500 mg via INTRAVENOUS
  Filled 2024-08-01: qty 100

## 2024-08-01 MED ORDER — MIDAZOLAM HCL (PF) 2 MG/2ML IJ SOLN
INTRAMUSCULAR | Status: DC | PRN
  Administered 2024-08-01: 2 mg via INTRAVENOUS

## 2024-08-01 MED ORDER — DEXAMETHASONE SOD PHOSPHATE PF 10 MG/ML IJ SOLN
INTRAMUSCULAR | Status: DC | PRN
  Administered 2024-08-01: 10 mg via INTRAVENOUS

## 2024-08-01 MED ORDER — SODIUM CHLORIDE 0.9 % IV SOLN
2.0000 g | INTRAVENOUS | Status: AC
  Administered 2024-08-01: 2 g via INTRAVENOUS
  Filled 2024-08-01: qty 2

## 2024-08-01 MED ORDER — ONDANSETRON HCL 4 MG/2ML IJ SOLN
INTRAMUSCULAR | Status: DC | PRN
  Administered 2024-08-01 (×2): 4 mg via INTRAVENOUS

## 2024-08-01 MED ORDER — EPHEDRINE SULFATE-NACL 50-0.9 MG/10ML-% IV SOSY
PREFILLED_SYRINGE | INTRAVENOUS | Status: DC | PRN
  Administered 2024-08-01: 5 mg via INTRAVENOUS

## 2024-08-01 MED ORDER — FENTANYL CITRATE (PF) 100 MCG/2ML IJ SOLN
25.0000 ug | INTRAMUSCULAR | Status: DC | PRN
  Administered 2024-08-01 (×3): 50 ug via INTRAVENOUS

## 2024-08-01 MED ORDER — GABAPENTIN 100 MG PO CAPS
ORAL_CAPSULE | ORAL | Status: AC
  Filled 2024-08-01: qty 1

## 2024-08-01 MED ORDER — PHENYLEPHRINE 80 MCG/ML (10ML) SYRINGE FOR IV PUSH (FOR BLOOD PRESSURE SUPPORT)
PREFILLED_SYRINGE | INTRAVENOUS | Status: DC | PRN
  Administered 2024-08-01: 160 ug via INTRAVENOUS

## 2024-08-01 MED ORDER — 0.9 % SODIUM CHLORIDE (POUR BTL) OPTIME
TOPICAL | Status: DC | PRN
  Administered 2024-08-01: 1000 mL

## 2024-08-01 MED ORDER — ACETAMINOPHEN 500 MG PO TABS
1000.0000 mg | ORAL_TABLET | Freq: Once | ORAL | Status: AC
  Administered 2024-08-01: 1000 mg via ORAL

## 2024-08-01 MED ORDER — PROPOFOL 10 MG/ML IV BOLUS
INTRAVENOUS | Status: DC | PRN
  Administered 2024-08-01: 150 mg via INTRAVENOUS

## 2024-08-01 MED ORDER — METRONIDAZOLE 500 MG/100ML IV SOLN
INTRAVENOUS | Status: AC
  Filled 2024-08-01: qty 100

## 2024-08-01 MED ORDER — LIDOCAINE 2% (20 MG/ML) 5 ML SYRINGE
INTRAMUSCULAR | Status: DC | PRN
  Administered 2024-08-01: 80 mg via INTRAVENOUS
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
## 2024-08-01 NOTE — Anesthesia Procedure Notes (Signed)
 Procedure Name: Intubation Date/Time: 08/01/2024 10:20 AM  Performed by: Kearney Rosina SAILOR, RNPre-anesthesia Checklist: Patient identified, Emergency Drugs available, Suction available and Patient being monitored Patient Re-evaluated:Patient Re-evaluated prior to induction Oxygen Delivery Method: Circle system utilized Preoxygenation: Pre-oxygenation with 100% oxygen Induction Type: IV induction Ventilation: Mask ventilation without difficulty Laryngoscope Size: Glidescope and 3 Grade View: Grade I Tube type: Oral Tube size: 7.0 mm Number of attempts: 1 Airway Equipment and Method: Stylet and Oral airway Placement Confirmation: ETT inserted through vocal cords under direct vision, positive ETCO2 and breath sounds checked- equal and bilateral Secured at: 21 cm Tube secured with: Tape Dental Injury: Teeth and Oropharynx as per pre-operative assessment  Comments: Atraumatic placement, Head and neck remained midline for intubation

## 2024-08-01 NOTE — Op Note (Signed)
 08/01/2024  [id number] Lauretha HERO Abdallah        OPERATIVE REPORT   Preop Diagnosis: menorrhagia, dysmenorrhea, anemia, fibroids, mirena  IUD in place  Procedure: robotic hysterectomy, removal of IUD,  bilateral salpingectomy, cystoscopy   Surgeon: Dr. Almarie Sauer Jaimya Feliciano Assistant: Judyann Rattler, RN  Circulator: Arlys Daine HERO, RN Scrub Person: Dyane Greig KATHRYNE Rubin, Hadassah MALACHI Arbutus Suzen HERO Circulator Assistant: Storm Maryelizabeth CROME, RN RN First Assistant: Rattler Judyann CROME, RN  Fluids: please see anesthesia report   Complications: None Anesthesia: General     Findings:  boggy contour 8cm uterus, normal ovaries and tubes Cystoscopy at the end of the case with normal bladder and patent ureters bilaterally.   Estimated blood loss: Minimal <15cc   Specimens: Uterus, cervix and bilateral tubes   Disposition of specimen: Pathology           Patient is taken to the operating room. She is placed in the supine position. She is a running IV in place. Informed consent was present on the chart. SCDs on her lower extremities and functioning properly. Patient was positioned while she was awake.  Her legs were placed in the low lithotomy position in Spencer stirrups. Her arms were tucked by the side.  General endotracheal anesthesia was administered by the anesthesia staff without difficulty.       Dura prep was then used to prep the abdomen and Hibiclens  was used to prep the inner thighs, perineum and vagina. Once 3 minutes had past the patient was draped in a normal standard fashion. A proper time out was performed and everyone agreed.  The legs were lifted to the high lithotomy position. A bivalve speculum was inserted into the vagina and the anterior lip of the cervix was grasped with single-tooth tenaculum. The IUD was removed intact and sent to pathology.  The uterus sounded to 8 cm. Pratt dilators were used to dilate the cervix.  The RUMI uterine manipulator was obtained  inserted into the endometrial cavity and the bulb of the disposable tip was inflated with 8 cc of normal saline. There was a good fit of the KOH ring around the cervix. The tenaculum and bivavle speculum was removed. There is also good manipulation of the uterus.  A Foley catheter was placed to straight drain.  Clear urine was noted. Legs were lowered to the low lithotomy position and attention was turned the abdomen.   Superior to the umbilicus, marcaine  0.25% used to anesthetize the skin.  Using #11 blade, 8mm skin incision was made.  The 8mm robotic trocar and sleeve was inserted under direct visualization.  CO2 gas was  started and patient was placed in trendelenburg position.  Two additional 8mm ports were placed under direct visualization in the left and right lower quadrant.     Ureters were identifies.  Attention was turned to the left side. The left tube was elevated and the mesosalpinx was desiccated with the vessel sealer.  The left uterine ovarian pedicle was serially clamped cauterized and incised. Left round ligament was serially clamped cauterized and incised. The anterior and posterior peritoneum of the inferior leaf of the broad ligament were opened. The beginning of the bladder flap was created.  The bladder was taken down below the level of the KOH ring. The left uterine artery skeletonized and then just superior to the KOH ring this vessel was serially clamped, cauterized, and incised.   Attention was turned the right side.  The uterus was placed on stretch to the  opposite side.    The mesosalpinx was incised freeing the tube. Then the right uterine ovarian pedicle was serially clamped cauterized and incised. Next the right round ligament was serially clamped cauterized and incised. The anterior posterior peritoneum of the inferiorly for the broad ligament were opened. The anterior peritoneum was carried across to the dissection on the left side. The remainder of the bladder flap was  created using sharp dissection. The bladder was well below the level of the KOH ring. The right uterine artery skeletonized. Then the right uterine artery, above the level of the KOH ring, was serially clamped cauterized and incised. The uterus was devascularized at this point.   The colpotomy was performed.  This was carried around a circumferential fashion until the vaginal mucosa was completely incised in the specimen was freed.  The specimen was then delivered to the vagina intact.  A vaginal occlusive device was used to maintain the pneumoperitoneum   Instruments were changed with a needle driver and prograsp.  Using a 9 inch  zero V-lock suture, the cuff was closed by incorporating the anterior and posterior vaginal mucosa in each stitch. This was carried across all the way to the left corner and a running fashion. Two stitches were brought back towards the midline and the suture was cut flush with the vagina. The needle was brought out the pelvis. The pelvis was irrigated. All pedicles were inspected. No bleeding was noted.   Co2 pressures were lowered to 8mm Hg.  Again, no bleeding was noted.  Ureters were noted deep in the pelvis to be peristalsing.  At this point the procedure was completed.  The remaining instruments were removed.  The ports were removed under direct visualization of the laparoscope and the pneumoperitoneum was relieved.   The skin was then closed with subcuticular stitches of 3-0 Vicryl. The skin was cleansed Dermabond was applied. Attention was then turned the vagina and the cuff was inspected. No bleeding was noted.  The Foley catheter was removed.  Cystoscopy was performed.  No sutures or bladder injuries were noted.  Ureters were noted with normal urine jets from each one was seen.  Foley was left out after the cystoscopic fluid was drained and cystoscope removed.  Sponge, lap, needle, instrument counts were correct x2. Patient tolerated the procedure very well. She was  awakened from anesthesia, extubated and taken to recovery in stable condition.      Dr. Glennon

## 2024-08-01 NOTE — Transfer of Care (Signed)
 Immediate Anesthesia Transfer of Care Note  Patient: Kim Elliott  Procedure(s) Performed: HYSTERECTOMY, TOTAL, ROBOT-ASSISTED, LAPAROSCOPIC, WITH BILATERAL SALPINGO-OOPHORECTOMY (Bilateral: Abdomen) CYSTOSCOPY (Bladder) REMOVAL, INTRAUTERINE DEVICE (Uterus)  Patient Location: PACU  Anesthesia Type:General  Level of Consciousness: awake, alert , and oriented  Airway & Oxygen Therapy: Patient Spontanous Breathing and Patient connected to nasal cannula oxygen  Post-op Assessment: Report given to RN and Post -op Vital signs reviewed and stable  Post vital signs: Reviewed and stable  Last Vitals:  Vitals Value Taken Time  BP 141/98 08/01/24 12:08  Temp    Pulse 82 08/01/24 12:10  Resp 19 08/01/24 12:10  SpO2 100 % 08/01/24 12:10  Vitals shown include unfiled device data.  Last Pain:  Vitals:   08/01/24 0744  TempSrc: Oral  PainSc: 8       Patients Stated Pain Goal: 7 (08/01/24 0744)  Complications: No notable events documented.

## 2024-08-02 ENCOUNTER — Encounter (HOSPITAL_COMMUNITY): Payer: Self-pay | Admitting: Obstetrics and Gynecology

## 2024-08-04 ENCOUNTER — Encounter: Admitting: Rheumatology

## 2024-08-04 NOTE — Anesthesia Postprocedure Evaluation (Signed)
"   Anesthesia Post Note  Patient: Kim Elliott  Procedure(s) Performed: HYSTERECTOMY, TOTAL, ROBOT-ASSISTED, LAPAROSCOPIC, WITH BILATERAL SALPINGO-OOPHORECTOMY (Bilateral: Abdomen) CYSTOSCOPY (Bladder) REMOVAL, INTRAUTERINE DEVICE (Uterus)     Patient location during evaluation: PACU Anesthesia Type: General Level of consciousness: awake and alert Pain management: pain level controlled Vital Signs Assessment: post-procedure vital signs reviewed and stable Respiratory status: spontaneous breathing, nonlabored ventilation, respiratory function stable and patient connected to nasal cannula oxygen Cardiovascular status: blood pressure returned to baseline and stable Postop Assessment: no apparent nausea or vomiting Anesthetic complications: no   No notable events documented.  Last Vitals:  Vitals:   08/01/24 1400 08/01/24 1415  BP: 93/69 97/66  Pulse: 75 79  Resp: 19 14  Temp:  36.7 C  SpO2: 92% 99%    Last Pain:  Vitals:   08/01/24 1400  TempSrc:   PainSc: Asleep                 Ernesto Zukowski L Krayton Wortley      "

## 2024-08-06 ENCOUNTER — Other Ambulatory Visit

## 2024-08-08 LAB — SURGICAL PATHOLOGY

## 2024-08-14 ENCOUNTER — Encounter: Admitting: Obstetrics and Gynecology

## 2024-09-01 ENCOUNTER — Ambulatory Visit: Admitting: Rheumatology

## 2024-09-11 ENCOUNTER — Encounter: Admitting: Obstetrics and Gynecology

## 2024-10-08 ENCOUNTER — Encounter: Admitting: Obstetrics and Gynecology

## 2024-11-19 ENCOUNTER — Other Ambulatory Visit
